# Patient Record
Sex: Female | Born: 1995 | Race: White | Hispanic: No | Marital: Single | Smoking: Former smoker
Health system: Southern US, Community
[De-identification: ages and names within clinical notes are randomized; demographics above are authoritative.]

## PROBLEM LIST (undated history)

## (undated) ENCOUNTER — Inpatient Hospital Stay (HOSPITAL_COMMUNITY): Payer: Self-pay

## (undated) DIAGNOSIS — F329 Major depressive disorder, single episode, unspecified: Secondary | ICD-10-CM

## (undated) DIAGNOSIS — F32A Depression, unspecified: Secondary | ICD-10-CM

## (undated) DIAGNOSIS — F419 Anxiety disorder, unspecified: Secondary | ICD-10-CM

## (undated) DIAGNOSIS — G039 Meningitis, unspecified: Secondary | ICD-10-CM

## (undated) DIAGNOSIS — M3500 Sicca syndrome, unspecified: Secondary | ICD-10-CM

## (undated) DIAGNOSIS — G919 Hydrocephalus, unspecified: Secondary | ICD-10-CM

## (undated) DIAGNOSIS — F4310 Post-traumatic stress disorder, unspecified: Secondary | ICD-10-CM

## (undated) DIAGNOSIS — G971 Other reaction to spinal and lumbar puncture: Secondary | ICD-10-CM

## (undated) HISTORY — PX: URETHRA SURGERY: SHX824

## (undated) HISTORY — PX: URETER SURGERY: SHX823

## (undated) HISTORY — PX: OTHER SURGICAL HISTORY: SHX169

## (undated) HISTORY — PX: CHOLECYSTECTOMY: SHX55

## (undated) HISTORY — PX: APPENDECTOMY: SHX54

## (undated) HISTORY — DX: Hydrocephalus, unspecified: G91.9

## (undated) HISTORY — DX: Sjogren syndrome, unspecified: M35.00

---

## 2015-02-27 ENCOUNTER — Encounter (HOSPITAL_COMMUNITY): Payer: Self-pay | Admitting: *Deleted

## 2015-02-27 ENCOUNTER — Inpatient Hospital Stay (HOSPITAL_COMMUNITY)
Admission: AD | Admit: 2015-02-27 | Discharge: 2015-02-27 | Disposition: A | Discharge status: Home / Self Care | Payer: Managed Care, Other (non HMO) | Source: Ambulatory Visit | Attending: Obstetrics & Gynecology | Admitting: Obstetrics & Gynecology

## 2015-02-27 ENCOUNTER — Inpatient Hospital Stay (HOSPITAL_COMMUNITY): Payer: Managed Care, Other (non HMO)

## 2015-02-27 DIAGNOSIS — O4691 Antepartum hemorrhage, unspecified, first trimester: Secondary | ICD-10-CM | POA: Diagnosis not present

## 2015-02-27 DIAGNOSIS — O209 Hemorrhage in early pregnancy, unspecified: Secondary | ICD-10-CM

## 2015-02-27 DIAGNOSIS — Z3A01 Less than 8 weeks gestation of pregnancy: Secondary | ICD-10-CM | POA: Diagnosis not present

## 2015-02-27 DIAGNOSIS — R102 Pelvic and perineal pain: Secondary | ICD-10-CM | POA: Diagnosis not present

## 2015-02-27 LAB — CBC
HEMATOCRIT: 37.2 % (ref 36.0–46.0)
HEMOGLOBIN: 12.9 g/dL (ref 12.0–15.0)
MCH: 30.3 pg (ref 26.0–34.0)
MCHC: 34.7 g/dL (ref 30.0–36.0)
MCV: 87.3 fL (ref 78.0–100.0)
Platelets: 253 10*3/uL (ref 150–400)
RBC: 4.26 MIL/uL (ref 3.87–5.11)
RDW: 12.6 % (ref 11.5–15.5)
WBC: 5.3 10*3/uL (ref 4.0–10.5)

## 2015-02-27 LAB — WET PREP, GENITAL
Clue Cells Wet Prep HPF POC: NONE SEEN
SPERM: NONE SEEN
TRICH WET PREP: NONE SEEN
YEAST WET PREP: NONE SEEN

## 2015-02-27 LAB — URINALYSIS, ROUTINE W REFLEX MICROSCOPIC
BILIRUBIN URINE: NEGATIVE
Glucose, UA: NEGATIVE mg/dL
HGB URINE DIPSTICK: NEGATIVE
KETONES UR: NEGATIVE mg/dL
Nitrite: NEGATIVE
PROTEIN: NEGATIVE mg/dL
Specific Gravity, Urine: 1.015 (ref 1.005–1.030)
pH: 7 (ref 5.0–8.0)

## 2015-02-27 LAB — URINE MICROSCOPIC-ADD ON: RBC / HPF: NONE SEEN RBC/hpf (ref 0–5)

## 2015-02-27 LAB — POCT PREGNANCY, URINE: PREG TEST UR: POSITIVE — AB

## 2015-02-27 LAB — HCG, QUANTITATIVE, PREGNANCY: HCG, BETA CHAIN, QUANT, S: 218 m[IU]/mL — AB (ref <5)

## 2015-02-27 LAB — TYPE AND SCREEN
ABO/RH(D): A POS
Antibody Screen: NEGATIVE

## 2015-02-27 LAB — ABO/RH: ABO/RH(D): A POS

## 2015-02-27 NOTE — MAU Note (Signed)
Spotting since last Sunday, sharp lower abd pain also for the last week.  Pos HPT on 11/26 & 11/28.

## 2015-02-27 NOTE — Discharge Instructions (Signed)

## 2015-02-27 NOTE — MAU Provider Note (Signed)
MAU HISTORY AND PHYSICAL  Chief Complaint:  Vaginal Bleeding and Abdominal Pain   Laura [name] is a 19 y.o.  G2P1001 with IUP at [redacted]w[redacted]d presenting for Vaginal Bleeding and Abdominal Pain  Spotting for one week. Developed pain a day after spotting. Pain getting worse. Pelvic pain, bilateral. No dysuria or hematuria. No fevers or chills. Positive upt at home. Feeling nauseaus. Taking pnv, no other meds. Bleeding described as light - pink tinge when wipes.   Past Medical History  Diagnosis Date  . Medical history non-contributory     Past Surgical History  Procedure Laterality Date  . Cholecystectomy    . Appendectomy      History reviewed. No pertinent family history.  Social History  Substance Use Topics  . Smoking status: Former Games developer  . Smokeless tobacco: None  . Alcohol Use: No    Allergies  Allergen Reactions  . Demerol [Meperidine] Hives    No prescriptions prior to admission    Review of Systems - Negative except for what is mentioned in HPI.  Physical Exam  Blood pressure 107/63, pulse 77, temperature 98.2 F (36.8 C), temperature source Oral, resp. rate 16, height  (1.702 m), weight 160 lb (72.576 kg), last menstrual period 01/20/2015. GENERAL: Well-developed, well-nourished female in no acute distress.  LUNGS: Clear to auscultation bilaterally.  HEART: Regular rate and rhythm. ABDOMEN: Soft, nontender, nondistended, gravid.  EXTREMITIES: Nontender, no edema, 2+ distal pulses. GU: white discharge, normal cervix, visually closed   Labs: Results for orders placed or performed during the hospital encounter of 02/27/15 (from the past 24 hour(s))  Urinalysis, Routine w reflex microscopic (not at [facility])   Collection Time: 02/27/15  1:00 PM  Result Value Ref Range   Color, Urine YELLOW YELLOW   APPearance CLEAR CLEAR   Specific Gravity, Urine 1.015 1.005 - 1.030   pH 7.0 5.0 - 8.0   Glucose, UA NEGATIVE NEGATIVE mg/dL   Hgb urine dipstick NEGATIVE  NEGATIVE   Bilirubin Urine NEGATIVE NEGATIVE   Ketones, ur NEGATIVE NEGATIVE mg/dL   Protein, ur NEGATIVE NEGATIVE mg/dL   Nitrite NEGATIVE NEGATIVE   Leukocytes, UA SMALL (A) NEGATIVE  Urine microscopic-add on   Collection Time: 02/27/15  1:00 PM  Result Value Ref Range   Squamous Epithelial / LPF 0-5 (A) NONE SEEN   WBC, UA 0-5 0 - 5 WBC/hpf   RBC / HPF NONE SEEN 0 - 5 RBC/hpf   Bacteria, UA FEW (A) NONE SEEN  Pregnancy, urine POC   Collection Time: 02/27/15  1:24 PM  Result Value Ref Range   Preg Test, Ur POSITIVE (A) NEGATIVE  CBC   Collection Time: 02/27/15  2:25 PM  Result Value Ref Range   WBC 5.3 4.0 - 10.5 K/uL   RBC 4.26 3.87 - 5.11 MIL/uL   Hemoglobin 12.9 12.0 - 15.0 g/dL   HCT 60.4 54.0 - 98.1 %   MCV 87.3 78.0 - 100.0 fL   MCH 30.3 26.0 - 34.0 pg   MCHC 34.7 30.0 - 36.0 g/dL   RDW 19.1 47.8 - 29.5 %   Platelets 253 150 - 400 K/uL  hCG, quantitative, pregnancy   Collection Time: 02/27/15  2:25 PM  Result Value Ref Range   hCG, Beta Chain, Quant, S 218 (H) <5 mIU/mL  Type and screen   Collection Time: 02/27/15  2:25 PM  Result Value Ref Range   ABO/RH(D) A POS    Antibody Screen NEG    Sample Expiration 03/02/2015  Wet prep, genital   Collection Time: 02/27/15  3:10 PM  Result Value Ref Range   Yeast Wet Prep HPF POC NONE SEEN NONE SEEN   Trich, Wet Prep NONE SEEN NONE SEEN   Clue Cells Wet Prep HPF POC NONE SEEN NONE SEEN   WBC, Wet Prep HPF POC MANY (A) NONE SEEN   Sperm NONE SEEN     Imaging Studies:  [place] Ob Comp Less 14 Wks  02/27/2015  CLINICAL DATA:  Vaginal bleeding and pain. EXAM: OBSTETRIC <14 WK US AND TRANSVAGINAL OB US TECHNIQUE: Both transabdominal and transvaginal ultrasound examinations were performed for complete evaluation of the gestation as well as the maternal uterus, adnexal regions, and pelvic cul-de-sac. Transvaginal technique was performed to assess early pregnancy. COMPARISON:  None. FINDINGS: Intrauterine gestational sac: A  well-defined double decidual sac is not seen. There is a 3 mm questionable gestational sac within the uterus. Yolk sac:  Not visualized Embryo:  Not visualized Cardiac Activity: Not visualized MSD: 3 mm  mm   5 w   0  d Maternal uterus/adnexae: There is no demonstrable subchorionic hemorrhage. Cervical os is closed. There are no extrauterine pelvic or adnexal masses. There is a small amount of free pelvic fluid, however. IMPRESSION: Questionable gestational sac within the uterus. If this structure in the uterus indeed is a gestational sac, estimated gestational age is approximately 5 weeks. An endometrial pseudosac cannot be excluded. There is a small amount of free fluid in the maternal pelvis but no extrauterine pelvic or adnexal masses appreciable. It should be noted that ectopic gestation with a so-called pseudosac within the endometrium cannot be excluded at this time. This circumstance warrants close clinical and serial beta HCG laboratory evaluations. Repeat timing of this ultrasound will in part depend on serial beta HCG evaluations. These results will be called to the ordering clinician or representative by the Radiologist Assistant, and communication documented in the PACS or zVision Dashboard. Electronically Signed   By: Bretta BangWilliam  Woodruff III M.D.   On: 02/27/2015 15:58   [place] Ob Transvaginal  02/27/2015  CLINICAL DATA:  Vaginal bleeding and pain. EXAM: OBSTETRIC <14 WK US AND TRANSVAGINAL OB US TECHNIQUE: Both transabdominal and transvaginal ultrasound examinations were performed for complete evaluation of the gestation as well as the maternal uterus, adnexal regions, and pelvic cul-de-sac. Transvaginal technique was performed to assess early pregnancy. COMPARISON:  None. FINDINGS: Intrauterine gestational sac: A well-defined double decidual sac is not seen. There is a 3 mm questionable gestational sac within the uterus. Yolk sac:  Not visualized Embryo:  Not visualized Cardiac Activity: Not visualized  MSD: 3 mm  mm   5 w   0  d Maternal uterus/adnexae: There is no demonstrable subchorionic hemorrhage. Cervical os is closed. There are no extrauterine pelvic or adnexal masses. There is a small amount of free pelvic fluid, however. IMPRESSION: Questionable gestational sac within the uterus. If this structure in the uterus indeed is a gestational sac, estimated gestational age is approximately 5 weeks. An endometrial pseudosac cannot be excluded. There is a small amount of free fluid in the maternal pelvis but no extrauterine pelvic or adnexal masses appreciable. It should be noted that ectopic gestation with a so-called pseudosac within the endometrium cannot be excluded at this time. This circumstance warrants close clinical and serial beta HCG laboratory evaluations. Repeat timing of this ultrasound will in part depend on serial beta HCG evaluations. These results will be called to the ordering clinician or representative by the  Printmaker, and communication documented in the PACS or zVision Dashboard. Electronically Signed   By: Bretta Bang III M.D.   On: 02/27/2015 15:58    Assessment: Laura [name] is  19 y.o. G2P1001 at [redacted]w[redacted]d presents with first trimester vaginal bleeding and pelvic pain. HCG is 218, u/s shows possible early pregnancy, possible pseudosac (thus possible ectopic). UA not suggestive of infection; neither is wet prep. Rh positive. Not anemic on CBC.   Plan: - f/u 48 hours for repeat HCG (signed and held) - ectopic return precautions  Cherrie Gauze [facility] 12/11/20164:13 PM

## 2015-02-28 LAB — GC/CHLAMYDIA PROBE AMP (~~LOC~~) NOT AT ARMC
CHLAMYDIA, DNA PROBE: NEGATIVE
NEISSERIA GONORRHEA: NEGATIVE

## 2015-03-01 ENCOUNTER — Inpatient Hospital Stay (HOSPITAL_COMMUNITY)
Admission: AD | Admit: 2015-03-01 | Discharge: 2015-03-01 | Disposition: A | Discharge status: Home / Self Care | Payer: Managed Care, Other (non HMO) | Source: Ambulatory Visit | Attending: Family Medicine | Admitting: Family Medicine

## 2015-03-01 DIAGNOSIS — Z3A01 Less than 8 weeks gestation of pregnancy: Secondary | ICD-10-CM | POA: Insufficient documentation

## 2015-03-01 DIAGNOSIS — O039 Complete or unspecified spontaneous abortion without complication: Secondary | ICD-10-CM | POA: Insufficient documentation

## 2015-03-01 LAB — HCG, QUANTITATIVE, PREGNANCY: hCG, Beta Chain, Quant, S: 170 m[IU]/mL — ABNORMAL HIGH (ref <5)

## 2015-03-01 NOTE — Discharge Instructions (Signed)
Miscarriage  A miscarriage is the sudden loss of an unborn baby (fetus) before the 20th week of pregnancy. Most miscarriages happen in the first 3 months of pregnancy. Sometimes, it happens before a woman even knows she is pregnant. A miscarriage is also called a "spontaneous miscarriage" or "early pregnancy loss." Having a miscarriage can be an emotional experience. Talk with your caregiver about any questions you may have about miscarrying, the grieving process, and your future pregnancy plans.  CAUSES    Problems with the fetal chromosomes that make it impossible for the baby to develop normally. Problems with the baby's genes or chromosomes are most often the result of errors that occur, by chance, as the embryo divides and grows. The problems are not inherited from the parents.   Infection of the cervix or uterus.    Hormone problems.    Problems with the cervix, such as having an incompetent cervix. This is when the tissue in the cervix is not strong enough to hold the pregnancy.    Problems with the uterus, such as an abnormally shaped uterus, uterine fibroids, or congenital abnormalities.    Certain medical conditions.    Smoking, drinking alcohol, or taking illegal drugs.    Trauma.   Often, the cause of a miscarriage is unknown.   SYMPTOMS    Vaginal bleeding or spotting, with or without cramps or pain.   Pain or cramping in the abdomen or lower back.   Passing fluid, tissue, or blood clots from the vagina.  DIAGNOSIS   Your caregiver will perform a physical exam. You may also have an ultrasound to confirm the miscarriage. Blood or urine tests may also be ordered.  TREATMENT    Sometimes, treatment is not necessary if you naturally pass all the fetal tissue that was in the uterus. If some of the fetus or placenta remains in the body (incomplete miscarriage), tissue left behind may become infected and must be removed. Usually, a dilation and curettage (D and C) procedure is performed.  During a D and C procedure, the cervix is widened (dilated) and any remaining fetal or placental tissue is gently removed from the uterus.   Antibiotic medicines are prescribed if there is an infection. Other medicines may be given to reduce the size of the uterus (contract) if there is a lot of bleeding.   If you have Rh negative blood and your baby was Rh positive, you will need a Rh immunoglobulin shot. This shot will protect any future baby from having Rh blood problems in future pregnancies.  HOME CARE INSTRUCTIONS    Your caregiver may order bed rest or may allow you to continue light activity. Resume activity as directed by your caregiver.   Have someone help with home and family responsibilities during this time.    Keep track of the number of sanitary pads you use each day and how soaked (saturated) they are. Write down this information.    Do not use tampons. Do not douche or have sexual intercourse until approved by your caregiver.    Only take over-the-counter or prescription medicines for pain or discomfort as directed by your caregiver.    Do not take aspirin. Aspirin can cause bleeding.    Keep all follow-up appointments with your caregiver.    If you or your partner have problems with grieving, talk to your caregiver or seek counseling to help cope with the pregnancy loss. Allow enough time to grieve before trying to get pregnant again.     SEEK IMMEDIATE MEDICAL CARE IF:    You have severe cramps or pain in your back or abdomen.   You have a fever.   You pass large blood clots (walnut-sized or larger) ortissue from your vagina. Save any tissue for your caregiver to inspect.    Your bleeding increases.    You have a thick, bad-smelling vaginal discharge.   You become lightheaded, weak, or you faint.    You have chills.   MAKE SURE YOU:   Understand these instructions.   Will watch your condition.   Will get help right away if you are not doing well or get worse.     This  information is not intended to replace advice given to you by your health care provider. Make sure you discuss any questions you have with your health care provider.     Document Released: 08/29/2000 Document Revised: 06/30/2012 Document Reviewed: 04/24/2011  Elsevier Interactive Patient Education 2016 [employer]

## 2015-03-01 NOTE — MAU Note (Signed)
Pt presents to MAU for repeat labs BHCG. Reports pink when wiping, lower abdominal pain at times.

## 2015-03-01 NOTE — MAU Provider Note (Signed)
History   First Provider Initiated Contact with Patient 03/01/15 1721     Chief Complaint:  Labs Only   Laura [name] is  19 y.o. G2P1001 Patient's last menstrual period was 01/20/2015 (exact date).. Patient is here for follow up of quantitative HCG and ongoing surveillance of pregnancy status.   She is 103w5d weeks gestation  by LMP.    Since her last visit, the patient is without new complaint.     ROS Abdomin Pain: mild Vaginal bleeding: spotting.   Passage of clots or tissue: None Dizziness: None  Her previous Quantitative HCG values are:  Results for ECHO, PROPP (MRN [id number]) as of 03/01/2015 17:16  Ref. Range 02/27/2015 14:25  HCG, Beta Chain, Quant, S Latest Ref Range: <5 mIU/mL 218 (H)    Physical Exam   BP 117/76 mmHg  Pulse 70  Temp(Src) 98.4 F (36.9 C)  Resp 18  LMP 01/20/2015 (Exact Date) Constitutional: Well-nourished female in no apparent distress. No pallor Neuro: Alert and oriented 4 Cardiovascular: Normal rate Respiratory: Normal effort and rate Abdomen: Soft, nontender Gynecological Exam: examination not indicated  Labs: Results for orders placed or performed during the hospital encounter of 03/01/15 (from the past 24 hour(s))  hCG, quantitative, pregnancy   Collection Time: 03/01/15  4:15 PM  Result Value Ref Range   hCG, Beta Chain, Quant, S 170 (H) <5 mIU/mL    Ultrasound Studies:   US Ob Comp Less 14 Wks  02/27/2015  CLINICAL DATA:  Vaginal bleeding and pain. EXAM: OBSTETRIC <14 WK [place] AND TRANSVAGINAL OB US TECHNIQUE: Both transabdominal and transvaginal ultrasound examinations were performed for complete evaluation of the gestation as well as the maternal uterus, adnexal regions, and pelvic cul-de-sac. Transvaginal technique was performed to assess early pregnancy. COMPARISON:  None. FINDINGS: Intrauterine gestational sac: A well-defined double decidual sac is not seen. There is a 3 mm questionable gestational sac within the  uterus. Yolk sac:  Not visualized Embryo:  Not visualized Cardiac Activity: Not visualized MSD: 3 mm  mm   5 w   0  d Maternal uterus/adnexae: There is no demonstrable subchorionic hemorrhage. Cervical os is closed. There are no extrauterine pelvic or adnexal masses. There is a small amount of free pelvic fluid, however. IMPRESSION: Questionable gestational sac within the uterus. If this structure in the uterus indeed is a gestational sac, estimated gestational age is approximately 5 weeks. An endometrial pseudosac cannot be excluded. There is a small amount of free fluid in the maternal pelvis but no extrauterine pelvic or adnexal masses appreciable. It should be noted that ectopic gestation with a so-called pseudosac within the endometrium cannot be excluded at this time. This circumstance warrants close clinical and serial beta HCG laboratory evaluations. Repeat timing of this ultrasound will in part depend on serial beta HCG evaluations. These results will be called to the ordering clinician or representative by the Radiologist Assistant, and communication documented in the PACS or zVision Dashboard. Electronically Signed   By: Bretta Bang III M.D.   On: 02/27/2015 15:58   US Ob Transvaginal  02/27/2015  CLINICAL DATA:  Vaginal bleeding and pain. EXAM: OBSTETRIC <14 WK [place] AND TRANSVAGINAL OB US TECHNIQUE: Both transabdominal and transvaginal ultrasound examinations were performed for complete evaluation of the gestation as well as the maternal uterus, adnexal regions, and pelvic cul-de-sac. Transvaginal technique was performed to assess early pregnancy. COMPARISON:  None. FINDINGS: Intrauterine gestational sac: A well-defined double decidual sac is not seen. There is a 3 mm questionable  gestational sac within the uterus. Yolk sac:  Not visualized Embryo:  Not visualized Cardiac Activity: Not visualized MSD: 3 mm  mm   5 w   0  d Maternal uterus/adnexae: There is no demonstrable subchorionic hemorrhage.  Cervical os is closed. There are no extrauterine pelvic or adnexal masses. There is a small amount of free pelvic fluid, however. IMPRESSION: Questionable gestational sac within the uterus. If this structure in the uterus indeed is a gestational sac, estimated gestational age is approximately 5 weeks. An endometrial pseudosac cannot be excluded. There is a small amount of free fluid in the maternal pelvis but no extrauterine pelvic or adnexal masses appreciable. It should be noted that ectopic gestation with a so-called pseudosac within the endometrium cannot be excluded at this time. This circumstance warrants close clinical and serial beta HCG laboratory evaluations. Repeat timing of this ultrasound will in part depend on serial beta HCG evaluations. These results will be called to the ordering clinician or representative by the Radiologist Assistant, and communication documented in the PACS or zVision Dashboard. Electronically Signed   By: Bretta BangWilliam  Woodruff III M.D.   On: 02/27/2015 15:58    MAU course/MDM: Quantitative hCG ordered  Pain and bleeding in early pregnancy with drop in Quant, but hemodynamically stable. Consistent with either miscarriage are spontaneously resolving ectopic.  Assessment: 519w5d weeks gestation w/ drop in Quant 1. Miscarriage    Plan: Discharge home in stable condition. Bleeding precautions Support given. Offered chaplain--declined Follow-up Information    [facility]   Specialty:  Obstetrics and Gynecology   Why:  Weekly until pregnancy hormone level less than 1.   Contact information:   [address] [place] [id number] [phone]      Follow up with [facility].   Why:  As needed in emergencies   Contact information:   [address] [id number] mc [place] [id number] [phone]        Medication List    Notice    You have not been  prescribed any medications.     Dorathy KinsmanVirginia Takoda Siedlecki, CNM 03/01/2015, 5:16 PM  2/3

## 2015-03-09 ENCOUNTER — Other Ambulatory Visit: Payer: Managed Care, Other (non HMO)

## 2015-03-11 ENCOUNTER — Other Ambulatory Visit: Payer: Managed Care, Other (non HMO)

## 2015-03-11 DIAGNOSIS — O039 Complete or unspecified spontaneous abortion without complication: Secondary | ICD-10-CM

## 2015-03-12 LAB — HCG, QUANTITATIVE, PREGNANCY: hCG, Beta Chain, Quant, S: <2 m[IU]/mL

## 2015-03-16 ENCOUNTER — Telehealth: Payer: Self-pay | Admitting: General Practice

## 2015-03-16 NOTE — Telephone Encounter (Signed)
Left another message for patient to call back 

## 2015-03-16 NOTE — Telephone Encounter (Signed)
Patient called and left message stating she is returning our call. 

## 2015-03-17 ENCOUNTER — Encounter: Payer: Self-pay | Admitting: Family Medicine

## 2015-03-17 NOTE — Telephone Encounter (Signed)
Called pt and informed pt of her <2 beta results.  I informed her no more f/u lab work and that she has an appt scheduled on 03/30/14 to discuss any concerns and BC.  Pt stated understanding with no further questions.

## 2015-03-31 ENCOUNTER — Encounter: Payer: Managed Care, Other (non HMO) | Admitting: Obstetrics and Gynecology

## 2015-03-31 ENCOUNTER — Encounter: Payer: Self-pay | Admitting: *Deleted

## 2015-03-31 ENCOUNTER — Telehealth: Payer: Self-pay | Admitting: *Deleted

## 2015-03-31 NOTE — Telephone Encounter (Signed)
Laura [name] missed a scheduled appointment today for follow up after a miscarriage and birth control if desired.   Called Laura [name] and left a message that she missed a scheduled appointment- please call us to reschedule if you are having any problems or any needs that we can assist with.  Will also send letter.

## 2015-05-27 ENCOUNTER — Encounter: Payer: Self-pay | Admitting: Family

## 2015-05-27 ENCOUNTER — Ambulatory Visit (INDEPENDENT_AMBULATORY_CARE_PROVIDER_SITE_OTHER): Payer: Managed Care, Other (non HMO) | Admitting: Family

## 2015-05-27 VITALS — BP 110/74 | HR 81 | Wt 159.0 lb

## 2015-05-27 DIAGNOSIS — Z124 Encounter for screening for malignant neoplasm of cervix: Secondary | ICD-10-CM

## 2015-05-27 DIAGNOSIS — Z113 Encounter for screening for infections with a predominantly sexual mode of transmission: Secondary | ICD-10-CM | POA: Diagnosis not present

## 2015-05-27 DIAGNOSIS — Z3491 Encounter for supervision of normal pregnancy, unspecified, first trimester: Secondary | ICD-10-CM

## 2015-05-27 DIAGNOSIS — Z3481 Encounter for supervision of other normal pregnancy, first trimester: Secondary | ICD-10-CM

## 2015-05-27 DIAGNOSIS — Z3490 Encounter for supervision of normal pregnancy, unspecified, unspecified trimester: Secondary | ICD-10-CM | POA: Insufficient documentation

## 2015-05-27 LAB — CBC
HEMATOCRIT: 37.7 % (ref 36.0–46.0)
HEMOGLOBIN: 12.8 g/dL (ref 12.0–15.0)
MCH: 29.9 pg (ref 26.0–34.0)
MCHC: 34 g/dL (ref 30.0–36.0)
MCV: 88.1 fL (ref 78.0–100.0)
MPV: 10.4 fL (ref 8.6–12.4)
Platelets: 181 10*3/uL (ref 150–400)
RBC: 4.28 MIL/uL (ref 3.87–5.11)
RDW: 13.3 % (ref 11.5–15.5)
WBC: 4.6 10*3/uL (ref 4.0–10.5)

## 2015-05-27 LAB — HIV ANTIBODY (ROUTINE TESTING W REFLEX): HIV 1&2 Ab, 4th Generation: NONREACTIVE

## 2015-05-27 NOTE — Progress Notes (Signed)
Ultrasound CRL:17.4 mm and consistent with LMP. Armandina StammerJennifer Howard RN BSN
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
## 2015-05-27 NOTE — Patient Instructions (Signed)
First Trimester of Pregnancy The first trimester of pregnancy is from week 1 until the end of week 12 (months 1 through 3). A week after a sperm fertilizes an egg, the egg will implant on the wall of the uterus. This embryo will begin to develop into a baby. Genes from you and your partner are forming the baby. The female genes determine whether the baby is a boy or a girl. At 6-8 weeks, the eyes and face are formed, and the heartbeat can be seen on ultrasound. At the end of 12 weeks, all the baby's organs are formed.  Now that you are pregnant, you will want to do everything you can to have a healthy baby. Two of the most important things are to get good prenatal care and to follow your health care provider's instructions. Prenatal care is all the medical care you receive before the baby's birth. This care will help prevent, find, and treat any problems during the pregnancy and childbirth. BODY CHANGES Your body goes through many changes during pregnancy. The changes vary from woman to woman.   You may gain or lose a couple of pounds at first.  You may feel sick to your stomach (nauseous) and throw up (vomit). If the vomiting is uncontrollable, call your health care provider.  You may tire easily.  You may develop headaches that can be relieved by medicines approved by your health care provider.  You may urinate more often. Painful urination may mean you have a bladder infection.  You may develop heartburn as a result of your pregnancy.  You may develop constipation because certain hormones are causing the muscles that push waste through your intestines to slow down.  You may develop hemorrhoids or swollen, bulging veins (varicose veins).  Your breasts may begin to grow larger and become tender. Your nipples may stick out more, and the tissue that surrounds them (areola) may become darker.  Your gums may bleed and may be sensitive to brushing and flossing.  Dark spots or blotches (chloasma,  mask of pregnancy) may develop on your face. This will likely fade after the baby is born.  Your menstrual periods will stop.  You may have a loss of appetite.  You may develop cravings for certain kinds of food.  You may have changes in your emotions from day to day, such as being excited to be pregnant or being concerned that something may go wrong with the pregnancy and baby.  You may have more vivid and strange dreams.  You may have changes in your hair. These can include thickening of your hair, rapid growth, and changes in texture. Some women also have hair loss during or after pregnancy, or hair that feels dry or thin. Your hair will most likely return to normal after your baby is born. WHAT TO EXPECT AT YOUR PRENATAL VISITS During a routine prenatal visit:  You will be weighed to make sure you and the baby are growing normally.  Your blood pressure will be taken.  Your abdomen will be measured to track your baby's growth.  The fetal heartbeat will be listened to starting around week 10 or 12 of your pregnancy.  Test results from any previous visits will be discussed. Your health care provider may ask you:  How you are feeling.  If you are feeling the baby move.  If you have had any abnormal symptoms, such as leaking fluid, bleeding, severe headaches, or abdominal cramping.  If you are using any tobacco products,   including cigarettes, chewing tobacco, and electronic cigarettes.  If you have any questions. Other tests that may be performed during your first trimester include:  Blood tests to find your blood type and to check for the presence of any previous infections. They will also be used to check for low iron levels (anemia) and Rh antibodies. Later in the pregnancy, blood tests for diabetes will be done along with other tests if problems develop.  Urine tests to check for infections, diabetes, or protein in the urine.  An ultrasound to confirm the proper growth  and development of the baby.  An amniocentesis to check for possible genetic problems.  Fetal screens for spina bifida and Down syndrome.  You may need other tests to make sure you and the baby are doing well.  HIV (human immunodeficiency virus) testing. Routine prenatal testing includes screening for HIV, unless you choose not to have this test. HOME CARE INSTRUCTIONS  Medicines  Follow your health care provider's instructions regarding medicine use. Specific medicines may be either safe or unsafe to take during pregnancy.  Take your prenatal vitamins as directed.  If you develop constipation, try taking a stool softener if your health care provider approves. Diet  Eat regular, well-balanced meals. Choose a variety of foods, such as meat or vegetable-based protein, fish, milk and low-fat dairy products, vegetables, fruits, and whole grain breads and cereals. Your health care provider will help you determine the amount of weight gain that is right for you.  Avoid raw meat and uncooked cheese. These carry germs that can cause birth defects in the baby.  Eating four or five small meals rather than three large meals a day may help relieve nausea and vomiting. If you start to feel nauseous, eating a few soda crackers can be helpful. Drinking liquids between meals instead of during meals also seems to help nausea and vomiting.  If you develop constipation, eat more high-fiber foods, such as fresh vegetables or fruit and whole grains. Drink enough fluids to keep your urine clear or pale yellow. Activity and Exercise  Exercise only as directed by your health care provider. Exercising will help you:  Control your weight.  Stay in shape.  Be prepared for labor and delivery.  Experiencing pain or cramping in the lower abdomen or low back is a good sign that you should stop exercising. Check with your health care provider before continuing normal exercises.  Try to avoid standing for long  periods of time. Move your legs often if you must stand in one place for a long time.  Avoid heavy lifting.  Wear low-heeled shoes, and practice good posture.  You may continue to have sex unless your health care provider directs you otherwise. Relief of Pain or Discomfort  Wear a good support bra for breast tenderness.   Take warm sitz baths to soothe any pain or discomfort caused by hemorrhoids. Use hemorrhoid cream if your health care provider approves.   Rest with your legs elevated if you have leg cramps or low back pain.  If you develop varicose veins in your legs, wear support hose. Elevate your feet for 15 minutes, 3-4 times a day. Limit salt in your diet. Prenatal Care  Schedule your prenatal visits by the twelfth week of pregnancy. They are usually scheduled monthly at first, then more often in the last 2 months before delivery.  Write down your questions. Take them to your prenatal visits.  Keep all your prenatal visits as directed by your   health care provider. Safety  Wear your seat belt at all times when driving.  Make a list of emergency phone numbers, including numbers for family, friends, the hospital, and police and fire departments. General Tips  Ask your health care provider for a referral to a local prenatal education class. Begin classes no later than at the beginning of month 6 of your pregnancy.  Ask for help if you have counseling or nutritional needs during pregnancy. Your health care provider can offer advice or refer you to specialists for help with various needs.  Do not use hot tubs, steam rooms, or saunas.  Do not douche or use tampons or scented sanitary pads.  Do not cross your legs for long periods of time.  Avoid cat litter boxes and soil used by cats. These carry germs that can cause birth defects in the baby and possibly loss of the fetus by miscarriage or stillbirth.  Avoid all smoking, herbs, alcohol, and medicines not prescribed by  your health care provider. Chemicals in these affect the formation and growth of the baby.  Do not use any tobacco products, including cigarettes, chewing tobacco, and electronic cigarettes. If you need help quitting, ask your health care provider. You may receive counseling support and other resources to help you quit.  Schedule a dentist appointment. At home, brush your teeth with a soft toothbrush and be gentle when you floss. SEEK MEDICAL CARE IF:   You have dizziness.  You have mild pelvic cramps, pelvic pressure, or nagging pain in the abdominal area.  You have persistent nausea, vomiting, or diarrhea.  You have a bad smelling vaginal discharge.  You have pain with urination.  You notice increased swelling in your face, hands, legs, or ankles. SEEK IMMEDIATE MEDICAL CARE IF:   You have a fever.  You are leaking fluid from your vagina.  You have spotting or bleeding from your vagina.  You have severe abdominal cramping or pain.  You have rapid weight gain or loss.  You vomit blood or material that looks like coffee grounds.  You are exposed to German measles and have never had them.  You are exposed to fifth disease or chickenpox.  You develop a severe headache.  You have shortness of breath.  You have any kind of trauma, such as from a fall or a car accident.   This information is not intended to replace advice given to you by your health care provider. Make sure you discuss any questions you have with your health care provider.   Document Released: 02/27/2001 Document Revised: 03/26/2014 Document Reviewed: 01/13/2013 Elsevier Interactive Patient Education 2016 Elsevier Inc.  

## 2015-05-29 LAB — CULTURE, URINE COMPREHENSIVE: Colony Count: 30000

## 2015-05-30 LAB — OBSTETRIC PANEL
Antibody Screen: NEGATIVE
BASOS ABS: 0 10*3/uL (ref 0.0–0.1)
Basophils Relative: 1 % (ref 0–1)
Eosinophils Absolute: 0 10*3/uL (ref 0.0–0.7)
Eosinophils Relative: 1 % (ref 0–5)
HCT: 37.7 % (ref 36.0–46.0)
Hemoglobin: 12.8 g/dL (ref 12.0–15.0)
Hepatitis B Surface Ag: NEGATIVE
LYMPHS ABS: 2.2 10*3/uL (ref 0.7–4.0)
LYMPHS PCT: 48 % — AB (ref 12–46)
MCH: 29.9 pg (ref 26.0–34.0)
MCHC: 34 g/dL (ref 30.0–36.0)
MCV: 88.1 fL (ref 78.0–100.0)
MPV: 10.4 fL (ref 8.6–12.4)
Monocytes Absolute: 0.4 10*3/uL (ref 0.1–1.0)
Monocytes Relative: 9 % (ref 3–12)
NEUTROS ABS: 1.9 10*3/uL (ref 1.7–7.7)
NEUTROS PCT: 41 % — AB (ref 43–77)
PLATELETS: 181 10*3/uL (ref 150–400)
RBC: 4.28 MIL/uL (ref 3.87–5.11)
RDW: 13.3 % (ref 11.5–15.5)
RUBELLA: 4.62 {index} — AB (ref <0.90)
Rh Type: POSITIVE
WBC: 4.6 10*3/uL (ref 4.0–10.5)

## 2015-05-30 LAB — GC/CHLAMYDIA PROBE AMP (~~LOC~~) NOT AT ARMC
CHLAMYDIA, DNA PROBE: NEGATIVE
NEISSERIA GONORRHEA: NEGATIVE

## 2015-05-30 LAB — CYTOLOGY - PAP

## 2015-06-15 ENCOUNTER — Encounter (HOSPITAL_COMMUNITY): Payer: Self-pay | Admitting: Family

## 2015-06-24 ENCOUNTER — Ambulatory Visit (INDEPENDENT_AMBULATORY_CARE_PROVIDER_SITE_OTHER): Payer: Managed Care, Other (non HMO) | Admitting: Advanced Practice Midwife

## 2015-06-24 ENCOUNTER — Encounter: Payer: Self-pay | Admitting: Advanced Practice Midwife

## 2015-06-24 VITALS — BP 113/74 | HR 68 | Wt 162.0 lb

## 2015-06-24 DIAGNOSIS — O210 Mild hyperemesis gravidarum: Secondary | ICD-10-CM

## 2015-06-24 DIAGNOSIS — Z3481 Encounter for supervision of other normal pregnancy, first trimester: Secondary | ICD-10-CM

## 2015-06-24 MED ORDER — DOXYLAMINE-PYRIDOXINE 10-10 MG PO TBEC
1.0000 | DELAYED_RELEASE_TABLET | Freq: Three times a day (TID) | ORAL | Status: DC | PRN
Start: 2015-06-24 — End: 2015-08-07

## 2015-06-24 NOTE — Patient Instructions (Addendum)
Morning Sickness Morning sickness is when you feel sick to your stomach (nauseous) during pregnancy. This nauseous feeling may or may not come with vomiting. It often occurs in the morning but can be a problem any time of day. Morning sickness is most common during the first trimester, but it may continue throughout pregnancy. While morning sickness is unpleasant, it is usually harmless unless you develop severe and continual vomiting (hyperemesis gravidarum). This condition requires more intense treatment.  CAUSES  The cause of morning sickness is not completely known but seems to be related to normal hormonal changes that occur in pregnancy. RISK FACTORS You are at greater risk if you:  Experienced nausea or vomiting before your pregnancy.  Had morning sickness during a previous pregnancy.  Are pregnant with more than one baby, such as twins. TREATMENT  Do not use any medicines (prescription, over-the-counter, or herbal) for morning sickness without first talking to your health care provider. Your health care provider may prescribe or recommend:  Vitamin B6 supplements.  Anti-nausea medicines.  The herbal medicine ginger. HOME CARE INSTRUCTIONS   Only take over-the-counter or prescription medicines as directed by your health care provider.  Taking multivitamins before getting pregnant can prevent or decrease the severity of morning sickness in most women.  Eat a piece of dry toast or unsalted crackers before getting out of bed in the morning.  Eat five or six small meals a day.  Eat dry and bland foods (rice, baked potato). Foods high in carbohydrates are often helpful.  Do not drink liquids with your meals. Drink liquids between meals.  Avoid greasy, fatty, and spicy foods.  Get someone to cook for you if the smell of any food causes nausea and vomiting.  If you feel nauseous after taking prenatal vitamins, take the vitamins at night or with a snack.  Snack on protein  foods (nuts, yogurt, cheese) between meals if you are hungry.  Eat unsweetened gelatins for desserts.  Wearing an acupressure wristband (worn for sea sickness) may be helpful.  Acupuncture may be helpful.  Do not smoke.  Get a humidifier to keep the air in your house free of odors.  Get plenty of fresh air. SEEK MEDICAL CARE IF:   Your home remedies are not working, and you need medicine.  You feel dizzy or lightheaded.  You are losing weight. SEEK IMMEDIATE MEDICAL CARE IF:   You have persistent and uncontrolled nausea and vomiting.  You pass out (faint). MAKE SURE YOU:  Understand these instructions.  Will watch your condition.  Will get help right away if you are not doing well or get worse.   This information is not intended to replace advice given to you by your health care provider. Make sure you discuss any questions you have with your health care provider.   Document Released: 04/26/2006 Document Revised: 03/10/2013 Document Reviewed: 08/20/2012 Elsevier Interactive Patient Education 2016 [employer].    Second Trimester of Pregnancy The second trimester is from week 13 through week 28, months 4 through 6. The second trimester is often a time when you feel your best. Your body has also adjusted to being pregnant, and you begin to feel better physically. Usually, morning sickness has lessened or quit completely, you may have more energy, and you may have an increase in appetite. The second trimester is also a time when the fetus is growing rapidly. At the end of the sixth month, the fetus is about 9 inches long and weighs about 1  pounds. You will likely begin to feel the baby move (quickening) between 18 and 20 weeks of the pregnancy. BODY CHANGES Your body goes through many changes during pregnancy. The changes vary from woman to woman.   Your weight will continue to increase. You will notice your lower abdomen bulging out.  You may begin to get stretch marks  on your hips, abdomen, and breasts.  You may develop headaches that can be relieved by medicines approved by your health care provider.  You may urinate more often because the fetus is pressing on your bladder.  You may develop or continue to have heartburn as a result of your pregnancy.  You may develop constipation because certain hormones are causing the muscles that push waste through your intestines to slow down.  You may develop hemorrhoids or swollen, bulging veins (varicose veins).  You may have back pain because of the weight gain and pregnancy hormones relaxing your joints between the bones in your pelvis and as a result of a shift in weight and the muscles that support your balance.  Your breasts will continue to grow and be tender.  Your gums may bleed and may be sensitive to brushing and flossing.  Dark spots or blotches (chloasma, mask of pregnancy) may develop on your face. This will likely fade after the baby is born.  A dark line from your belly button to the pubic area (linea nigra) may appear. This will likely fade after the baby is born.  You may have changes in your hair. These can include thickening of your hair, rapid growth, and changes in texture. Some women also have hair loss during or after pregnancy, or hair that feels dry or thin. Your hair will most likely return to normal after your baby is born. WHAT TO EXPECT AT YOUR PRENATAL VISITS During a routine prenatal visit:  You will be weighed to make sure you and the fetus are growing normally.  Your blood pressure will be taken.  Your abdomen will be measured to track your baby's growth.  The fetal heartbeat will be listened to.  Any test results from the previous visit will be discussed. Your health care provider may ask you:  How you are feeling.  If you are feeling the baby move.  If you have had any abnormal symptoms, such as leaking fluid, bleeding, severe headaches, or abdominal  cramping.  If you are using any tobacco products, including cigarettes, chewing tobacco, and electronic cigarettes.  If you have any questions. Other tests that may be performed during your second trimester include:  Blood tests that check for:  Low iron levels (anemia).  Gestational diabetes (between 24 and 28 weeks).  Rh antibodies.  Urine tests to check for infections, diabetes, or protein in the urine.  An ultrasound to confirm the proper growth and development of the baby.  An amniocentesis to check for possible genetic problems.  Fetal screens for spina bifida and Down syndrome.  HIV (human immunodeficiency virus) testing. Routine prenatal testing includes screening for HIV, unless you choose not to have this test. HOME CARE INSTRUCTIONS   Avoid all smoking, herbs, alcohol, and unprescribed drugs. These chemicals affect the formation and growth of the baby.  Do not use any tobacco products, including cigarettes, chewing tobacco, and electronic cigarettes. If you need help quitting, ask your health care provider. You may receive counseling support and other resources to help you quit.  Follow your health care provider's instructions regarding medicine use. There are medicines  that are either safe or unsafe to take during pregnancy.  Exercise only as directed by your health care provider. Experiencing uterine cramps is a good sign to stop exercising.  Continue to eat regular, healthy meals.  Wear a good support bra for breast tenderness.  Do not use hot tubs, steam rooms, or saunas.  Wear your seat belt at all times when driving.  Avoid raw meat, uncooked cheese, cat litter boxes, and soil used by cats. These carry germs that can cause birth defects in the baby.  Take your prenatal vitamins.  Take 1500-2000 mg of calcium daily starting at the 20th week of pregnancy until you deliver your baby.  Try taking a stool softener (if your health care provider approves) if  you develop constipation. Eat more high-fiber foods, such as fresh vegetables or fruit and whole grains. Drink plenty of fluids to keep your urine clear or pale yellow.  Take warm sitz baths to soothe any pain or discomfort caused by hemorrhoids. Use hemorrhoid cream if your health care provider approves.  If you develop varicose veins, wear support hose. Elevate your feet for 15 minutes, 3-4 times a day. Limit salt in your diet.  Avoid heavy lifting, wear low heel shoes, and practice good posture.  Rest with your legs elevated if you have leg cramps or low back pain.  Visit your dentist if you have not gone yet during your pregnancy. Use a soft toothbrush to brush your teeth and be gentle when you floss.  A sexual relationship may be continued unless your health care provider directs you otherwise.  Continue to go to all your prenatal visits as directed by your health care provider. SEEK MEDICAL CARE IF:   You have dizziness.  You have mild pelvic cramps, pelvic pressure, or nagging pain in the abdominal area.  You have persistent nausea, vomiting, or diarrhea.  You have a bad smelling vaginal discharge.  You have pain with urination. SEEK IMMEDIATE MEDICAL CARE IF:   You have a fever.  You are leaking fluid from your vagina.  You have spotting or bleeding from your vagina.  You have severe abdominal cramping or pain.  You have rapid weight gain or loss.  You have shortness of breath with chest pain.  You notice sudden or extreme swelling of your face, hands, ankles, feet, or legs.  You have not felt your baby move in over an hour.  You have severe headaches that do not go away with medicine.  You have vision changes.   This information is not intended to replace advice given to you by your health care provider. Make sure you discuss any questions you have with your health care provider.   Document Released: 02/27/2001 Document Revised: 03/26/2014 Document Reviewed:  05/06/2012 Elsevier Interactive Patient Education [employer]2016 Elsevier Inc.

## 2015-06-24 NOTE — Progress Notes (Signed)
Patient complaining of morning sickness Armandina StammerJennifer Howard RN BSN
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
## 2015-06-30 ENCOUNTER — Other Ambulatory Visit: Payer: Self-pay | Admitting: Family

## 2015-06-30 ENCOUNTER — Ambulatory Visit (HOSPITAL_COMMUNITY)
Admission: RE | Admit: 2015-06-30 | Discharge: 2015-06-30 | Disposition: A | Discharge status: Home / Self Care | Payer: Managed Care, Other (non HMO) | Source: Ambulatory Visit | Attending: Family | Admitting: Family

## 2015-06-30 ENCOUNTER — Encounter (HOSPITAL_COMMUNITY): Payer: Self-pay

## 2015-06-30 DIAGNOSIS — Z36 Encounter for antenatal screening of mother: Secondary | ICD-10-CM | POA: Insufficient documentation

## 2015-06-30 DIAGNOSIS — Z3481 Encounter for supervision of other normal pregnancy, first trimester: Secondary | ICD-10-CM

## 2015-06-30 DIAGNOSIS — Z3A12 12 weeks gestation of pregnancy: Secondary | ICD-10-CM

## 2015-06-30 DIAGNOSIS — Z369 Encounter for antenatal screening, unspecified: Secondary | ICD-10-CM

## 2015-07-11 ENCOUNTER — Encounter: Payer: Self-pay | Admitting: *Deleted

## 2015-07-19 ENCOUNTER — Other Ambulatory Visit (HOSPITAL_COMMUNITY): Payer: Self-pay

## 2015-07-22 ENCOUNTER — Ambulatory Visit (INDEPENDENT_AMBULATORY_CARE_PROVIDER_SITE_OTHER): Payer: Managed Care, Other (non HMO) | Admitting: Family

## 2015-07-22 VITALS — BP 109/73 | HR 85 | Wt 165.0 lb

## 2015-07-22 DIAGNOSIS — Z36 Encounter for antenatal screening of mother: Secondary | ICD-10-CM | POA: Diagnosis not present

## 2015-07-22 DIAGNOSIS — Z3482 Encounter for supervision of other normal pregnancy, second trimester: Secondary | ICD-10-CM

## 2015-07-22 NOTE — Progress Notes (Signed)
Subjective:  Laura [name] is a 20 y.o. G3P1011 at 231w0d being seen today for ongoing prenatal care.  She is currently monitored for the following issues for this low-risk pregnancy and has Supervision of normal pregnancy, antepartum on her problem list.  Patient reports no complaints.  Contractions: Not present. Vag. Bleeding: None.  Movement: Absent. Denies leaking of fluid.   The following portions of the patient's history were reviewed and updated as appropriate: allergies, current medications, past family history, past medical history, past social history, past surgical history and problem list. Problem list updated.  Objective:   Filed Vitals:   07/22/15 0951  BP: 109/73  Pulse: 85  Weight: 165 lb (74.844 kg)    Fetal Status: Fetal Heart Rate (bpm): 133 Fundal Height: 18 cm Movement: Absent     General:  Alert, oriented and cooperative. Patient is in no acute distress.  Skin: Skin is warm and dry. No rash noted.   Cardiovascular: Normal heart rate noted  Respiratory: Normal respiratory effort, no problems with respiration noted  Abdomen: Soft, gravid, appropriate for gestational age. Pain/Pressure: Present     Pelvic: Vag. Bleeding: None Vag D/C Character: Thin   Cervical exam deferred        Extremities: Normal range of motion.  Edema: None  Mental Status: Normal mood and affect. Normal behavior. Normal judgment and thought content.   Urinalysis: Urine Protein: Negative Urine Glucose: Negative  Assessment and Plan:  Pregnancy: G3P1011 at 2431w0d  1. Supervision of normal pregnancy, antepartum, second trimester - Reviewed lab results - Alpha fetoprotein, maternal - US MFM OB COMP + 14 WK; Future  General obstetric precautions including but not limited to vaginal bleeding and pelvic pain reviewed in detail with the patient. Please refer to After Visit Summary for other counseling recommendations.  Return in about 4 weeks (around 08/19/2015).   Eino FarberWalidah Kennith GainN Karim,  CNM

## 2015-07-26 LAB — ALPHA FETOPROTEIN, MATERNAL
AFP: 34 ng/mL
CURR GEST AGE: 16 wk
MoM for AFP: 1.07
OPEN SPINA BIFIDA: NEGATIVE

## 2015-08-07 ENCOUNTER — Inpatient Hospital Stay (HOSPITAL_COMMUNITY)
Admission: AD | Admit: 2015-08-07 | Discharge: 2015-08-07 | Disposition: A | Discharge status: Home / Self Care | Payer: Medicaid Other | Source: Ambulatory Visit | Attending: Obstetrics and Gynecology | Admitting: Obstetrics and Gynecology

## 2015-08-07 ENCOUNTER — Encounter (HOSPITAL_COMMUNITY): Payer: Self-pay | Admitting: *Deleted

## 2015-08-07 DIAGNOSIS — M549 Dorsalgia, unspecified: Secondary | ICD-10-CM

## 2015-08-07 DIAGNOSIS — Z3A18 18 weeks gestation of pregnancy: Secondary | ICD-10-CM | POA: Diagnosis not present

## 2015-08-07 DIAGNOSIS — R252 Cramp and spasm: Secondary | ICD-10-CM | POA: Diagnosis not present

## 2015-08-07 DIAGNOSIS — O99891 Other specified diseases and conditions complicating pregnancy: Secondary | ICD-10-CM

## 2015-08-07 DIAGNOSIS — O26892 Other specified pregnancy related conditions, second trimester: Secondary | ICD-10-CM

## 2015-08-07 DIAGNOSIS — O9989 Other specified diseases and conditions complicating pregnancy, childbirth and the puerperium: Secondary | ICD-10-CM

## 2015-08-07 LAB — URINALYSIS, ROUTINE W REFLEX MICROSCOPIC
BILIRUBIN URINE: NEGATIVE
GLUCOSE, UA: NEGATIVE mg/dL
Hgb urine dipstick: NEGATIVE
KETONES UR: NEGATIVE mg/dL
LEUKOCYTES UA: NEGATIVE
NITRITE: NEGATIVE
PROTEIN: NEGATIVE mg/dL
Specific Gravity, Urine: 1.01 (ref 1.005–1.030)
pH: 5.5 (ref 5.0–8.0)

## 2015-08-07 MED ORDER — CYCLOBENZAPRINE HCL 10 MG PO TABS
10.0000 mg | ORAL_TABLET | Freq: Once | ORAL | Status: AC
  Administered 2015-08-07: 10 mg via ORAL
  Filled 2015-08-07: qty 1

## 2015-08-07 MED ORDER — CYCLOBENZAPRINE HCL 10 MG PO TABS: 10.0000 mg | ORAL_TABLET | Freq: Two times a day (BID) | ORAL | Status: DC | PRN

## 2015-08-07 NOTE — MAU Note (Signed)
Pt states her lower back started hurting yesterday am and has worsened today and is now having lower abd tightening.
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
## 2015-08-08 ENCOUNTER — Ambulatory Visit (HOSPITAL_COMMUNITY)
Admission: RE | Admit: 2015-08-08 | Discharge: 2015-08-08 | Disposition: A | Discharge status: Home / Self Care | Payer: Medicaid Other | Source: Ambulatory Visit | Attending: Family | Admitting: Family

## 2015-08-08 ENCOUNTER — Ambulatory Visit (HOSPITAL_COMMUNITY): Payer: Managed Care, Other (non HMO)

## 2015-08-08 ENCOUNTER — Other Ambulatory Visit: Payer: Self-pay | Admitting: Family

## 2015-08-08 DIAGNOSIS — Z3A18 18 weeks gestation of pregnancy: Secondary | ICD-10-CM | POA: Insufficient documentation

## 2015-08-08 DIAGNOSIS — Z36 Encounter for antenatal screening of mother: Secondary | ICD-10-CM | POA: Insufficient documentation

## 2015-08-08 DIAGNOSIS — Z3482 Encounter for supervision of other normal pregnancy, second trimester: Secondary | ICD-10-CM

## 2015-08-08 DIAGNOSIS — Z3689 Encounter for other specified antenatal screening: Secondary | ICD-10-CM

## 2015-08-19 ENCOUNTER — Encounter: Payer: Managed Care, Other (non HMO) | Admitting: Family

## 2015-08-25 ENCOUNTER — Ambulatory Visit (INDEPENDENT_AMBULATORY_CARE_PROVIDER_SITE_OTHER): Payer: Medicaid Other | Admitting: Obstetrics & Gynecology

## 2015-08-25 VITALS — BP 114/61 | HR 77 | Wt 175.0 lb

## 2015-08-25 DIAGNOSIS — Z3482 Encounter for supervision of other normal pregnancy, second trimester: Secondary | ICD-10-CM

## 2015-08-25 NOTE — Progress Notes (Signed)
Subjective:  Laura SheffieldRebecca Grabill is a 20 y.o. G3P1011 at 5362w6d being seen today for ongoing prenatal care.  She is currently monitored for the following issues for this low-risk pregnancy and has Supervision of normal pregnancy, antepartum on her problem list.  Patient reports no complaints.  Contractions: Not present. Vag. Bleeding: None.  Movement: Present. Denies leaking of fluid.   The following portions of the patient's history were reviewed and updated as appropriate: allergies, current medications, past family history, past medical history, past social history, past surgical history and problem list. Problem list updated.  Objective:   Filed Vitals:   08/25/15 1413  BP: 114/61  Pulse: 77  Weight: 175 lb (79.379 kg)    Fetal Status: Fetal Heart Rate (bpm): 129   Movement: Present     General:  Alert, oriented and cooperative. Patient is in no acute distress.  Skin: Skin is warm and dry. No rash noted.   Cardiovascular: Normal heart rate noted  Respiratory: Normal respiratory effort, no problems with respiration noted  Abdomen: Soft, gravid, appropriate for gestational age. Pain/Pressure: Absent     Pelvic: Vag. Bleeding: None Vag D/C Character: Thin   Cervical exam performed        Extremities: Normal range of motion.  Edema: None  Mental Status: Normal mood and affect. Normal behavior. Normal judgment and thought content.   Urinalysis: Urine Protein: Negative Urine Glucose: Negative  Assessment and Plan:  Pregnancy: G3P1011 at 3962w6d  There are no diagnoses linked to this encounter. Preterm labor symptoms and general obstetric precautions including but not limited to vaginal bleeding, contractions, leaking of fluid and fetal movement were reviewed in detail with the patient. Please refer to After Visit Summary for other counseling recommendations.  No Follow-up on file.   Allie BossierMyra C Josepha Barbier, MD
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
## 2015-09-10 ENCOUNTER — Inpatient Hospital Stay (HOSPITAL_COMMUNITY)
Admission: AD | Admit: 2015-09-10 | Discharge: 2015-09-10 | Disposition: A | Discharge status: Home / Self Care | Payer: Medicaid Other | Source: Ambulatory Visit | Attending: Obstetrics and Gynecology | Admitting: Obstetrics and Gynecology

## 2015-09-10 ENCOUNTER — Encounter (HOSPITAL_COMMUNITY): Payer: Self-pay | Admitting: *Deleted

## 2015-09-10 DIAGNOSIS — O26899 Other specified pregnancy related conditions, unspecified trimester: Secondary | ICD-10-CM

## 2015-09-10 DIAGNOSIS — Z87891 Personal history of nicotine dependence: Secondary | ICD-10-CM | POA: Diagnosis not present

## 2015-09-10 DIAGNOSIS — R109 Unspecified abdominal pain: Secondary | ICD-10-CM | POA: Diagnosis not present

## 2015-09-10 DIAGNOSIS — Z3A23 23 weeks gestation of pregnancy: Secondary | ICD-10-CM | POA: Diagnosis not present

## 2015-09-10 DIAGNOSIS — O26892 Other specified pregnancy related conditions, second trimester: Secondary | ICD-10-CM | POA: Diagnosis not present

## 2015-09-10 DIAGNOSIS — O9989 Other specified diseases and conditions complicating pregnancy, childbirth and the puerperium: Secondary | ICD-10-CM | POA: Diagnosis not present

## 2015-09-10 DIAGNOSIS — N898 Other specified noninflammatory disorders of vagina: Secondary | ICD-10-CM

## 2015-09-10 LAB — URINALYSIS W MICROSCOPIC (NOT AT ARMC)
BACTERIA UA: NONE SEEN
Bilirubin Urine: NEGATIVE
Glucose, UA: NEGATIVE mg/dL
Hgb urine dipstick: NEGATIVE
KETONES UR: NEGATIVE mg/dL
LEUKOCYTES UA: NEGATIVE
Nitrite: NEGATIVE
PROTEIN: NEGATIVE mg/dL
RBC / HPF: NONE SEEN RBC/hpf (ref 0–5)
Specific Gravity, Urine: <1.005 — ABNORMAL LOW (ref 1.005–1.030)
pH: 6 (ref 5.0–8.0)

## 2015-09-10 LAB — WET PREP, GENITAL
CLUE CELLS WET PREP: NONE SEEN
Sperm: NONE SEEN
TRICH WET PREP: NONE SEEN
YEAST WET PREP: NONE SEEN

## 2015-09-10 NOTE — MAU Provider Note (Signed)
MAU HISTORY AND PHYSICAL  Chief Complaint:  Vaginal Discharge and Contractions   Laura [name] is a 20 y.o.  G3P1011 with IUP at 1883w1d presenting for Vaginal Discharge and Contractions Pt states this occurred yesterday at around 2PM went to urinate and noted upon wiping, brownish/mucous like discharge.  This occurred only once and since then has felt some irregular contractions lasting less than a minute, up to 2-3 hours very mild.  States fetus has been moving frequently.  Last intercourse: 72 hours, non painful, no blood.  Prenatal risk factors: occasional 1/2 glass of wine, former smoker (quit upon dx of pregnancy) Patient states she has been having  irregular, every 30 minutes contractions, minimal vaginal bleeding, intact membranes, with active fetal movement.    Past Medical History  Diagnosis Date  . Medical history non-contributory     Past Surgical History  Procedure Laterality Date  . Cholecystectomy    . Appendectomy      History reviewed. No pertinent family history.  Social History  Substance Use Topics  . Smoking status: Former Games developermoker  . Smokeless tobacco: None  . Alcohol Use: No    Allergies  Allergen Reactions  . Divalproex Sodium Other (See Comments)    Reaction:  Memory loss   . Demerol [Meperidine] Hives    Prescriptions prior to admission  Medication Sig Dispense Refill Last Dose  . acetaminophen (TYLENOL) 500 MG tablet Take 500-1,000 mg by mouth every 6 (six) hours as needed for mild pain or headache.   Taking  . cyclobenzaprine (FLEXERIL) 10 MG tablet Take 1 tablet (10 mg total) by mouth 2 (two) times daily as needed for muscle spasms. 30 tablet 0 Taking  . loratadine (CLARITIN) 10 MG tablet Take 10 mg by mouth daily as needed for allergies.   Taking  . Prenatal Vit-Fe Fumarate-FA (PRENATAL MULTIVITAMIN) TABS tablet Take 1 tablet by mouth at bedtime.   Taking    Review of Systems - Negative except for what is mentioned in HPI.  Physical Exam   Blood pressure 111/76, pulse 77, temperature 98 F (36.7 C), resp. rate 18, height 5\' 7"  (1.702 m), weight 183 lb 3.2 oz (83.099 kg), last menstrual period 04/01/2015, unknown if currently breastfeeding. GENERAL: Well-developed, well-nourished female in no acute distress.  LUNGS: Clear to auscultation bilaterally.  HEART: Regular rate and rhythm. ABDOMEN: Soft, nontender, nondistended, gravid.  EXTREMITIES: Nontender, no edema, 2+ distal pulses. Cervical Exam:  Presentation: Unknown FHT:  Baseline  Contractions: None   Labs: No results found for this or any previous visit (from the past 24 hour(s)).  Imaging Studies:  No results found.  Assessment: Laura [name] is  20 y.o. G3P1011 at 6183w1d presents with Vaginal Discharge and Contractions .  Plan:  Olena LeatherwoodKelly M Aguilar 6/24/201711:06 PM  I spoke with and examined patient and agree with resident/PA/SNM's note and plan of care.  Mucousy brown d/c yesterday x 1 at 2pm- thought may be mucous plug, then started to have some cramping so thought she should get checked out. Denies malodorous d/c, itching/odor/irritation. Some urinary frequency, denies urgency/hesitancy/hematuria. Good fm, no lof or frank vb.  H/O term uncomplicated svb x 1 w/o h/o ptl. PNC @ [facility], next visit 6/30.   Spec exam: cx anterior, visually closed w/ good length so fFN not collected, small amt creamy white nonodorous d/c, no blood noted, wet prep and gc/ct collected SVE: outer os 1/inner os closed/thick/anterior FHR: 125, moderate, +accels, no decels=Cat 1 UCs: no uc's, occ ui  Results for  orders placed or performed during the hospital encounter of 09/10/15 (from the past 24 hour(s))  Urinalysis with microscopic (not at [facility])     Status: Abnormal   Collection Time: 09/10/15 10:00 PM  Result Value Ref Range   Color, Urine YELLOW YELLOW   APPearance CLEAR CLEAR   Specific Gravity, Urine <1.005 (L) 1.005 - 1.030   pH 6.0 5.0 - 8.0   Glucose, UA NEGATIVE  NEGATIVE mg/dL   Hgb urine dipstick NEGATIVE NEGATIVE   Bilirubin Urine NEGATIVE NEGATIVE   Ketones, ur NEGATIVE NEGATIVE mg/dL   Protein, ur NEGATIVE NEGATIVE mg/dL   Nitrite NEGATIVE NEGATIVE   Leukocytes, UA NEGATIVE NEGATIVE   WBC, UA 0-5 0 - 5 WBC/hpf   RBC / HPF NONE SEEN 0 - 5 RBC/hpf   Bacteria, UA NONE SEEN NONE SEEN   Squamous Epithelial / LPF 0-5 (A) NONE SEEN  Wet prep, genital     Status: Abnormal   Collection Time: 09/10/15 11:20 PM  Result Value Ref Range   Yeast Wet Prep HPF POC NONE SEEN NONE SEEN   Trich, Wet Prep NONE SEEN NONE SEEN   Clue Cells Wet Prep HPF POC NONE SEEN NONE SEEN   WBC, Wet Prep HPF POC MODERATE (A) NONE SEEN   Sperm NONE SEEN     A:   3771w1d SIUP  Vaginal discharge  Cramping  P:  D/C home  Reviewed ptl s/s, fm, reasons to return  Increase po fluids  Pelvic rest x7d from seeing blood of any color  Keep appt as scheduled 6/30 @ [facility]  Cheral MarkerKimberly R. Keene Gilkey, CNM, [facility] 09/10/2015 11:30 PM

## 2015-09-10 NOTE — MAU Note (Signed)
Urine sent to lab 

## 2015-09-10 NOTE — Discharge Instructions (Signed)
Call the clinic or go to [facility] if:  You begin to have strong, frequent contractions  Your water breaks.  Sometimes it is a big gush of fluid, sometimes it is just a trickle that keeps getting your panties wet or running down your legs  You have vaginal bleeding.  It is normal to have a small amount of spotting if your cervix was checked.   You don't feel your baby moving like normal.  If you don't, get you something to eat and drink and lay down and focus on feeling your baby move.  If your baby is still not moving like normal, call the clinic or go to [facility]  Preterm Labor Information Preterm labor is when labor starts at less than 37 weeks of pregnancy. The normal length of a pregnancy is 39 to 41 weeks. CAUSES Often, there is no identifiable underlying cause as to why a woman goes into preterm labor. One of the most common known causes of preterm labor is infection. Infections of the uterus, cervix, vagina, amniotic sac, bladder, kidney, or even the lungs (pneumonia) can cause labor to start. Other suspected causes of preterm labor include:   Urogenital infections, such as yeast infections and bacterial vaginosis.   Uterine abnormalities (uterine shape, uterine septum, fibroids, or bleeding from the placenta).   A cervix that has been operated on (it may fail to stay closed).   Malformations in the fetus.   Multiple gestations (twins, triplets, and so on).   Breakage of the amniotic sac.  RISK FACTORS  Having a previous history of preterm labor.   Having premature rupture of membranes (PROM).   Having a placenta that covers the opening of the cervix (placenta previa).   Having a placenta that separates from the uterus (placental abruption).   Having a cervix that is too weak to hold the fetus in the uterus (incompetent cervix).   Having too much fluid in the amniotic sac (polyhydramnios).   Taking illegal drugs or smoking while pregnant.    Not gaining enough weight while pregnant.   Being younger than 4318 and older than 22 years old.   Having a low socioeconomic status.   Being African American. SYMPTOMS Signs and symptoms of preterm labor include:   Menstrual-like cramps, abdominal pain, or back pain.  Uterine contractions that are regular, as frequent as six in an hour, regardless of their intensity (may be mild or painful).  Contractions that start on the top of the uterus and spread down to the lower abdomen and back.   A sense of increased pelvic pressure.   A watery or bloody mucus discharge that comes from the vagina.  TREATMENT Depending on the length of the pregnancy and other circumstances, your health care provider may suggest bed rest. If necessary, there are medicines that can be given to stop contractions and to mature the fetal lungs. If labor happens before 34 weeks of pregnancy, a prolonged hospital stay may be recommended. Treatment depends on the condition of both you and the fetus.  WHAT SHOULD YOU DO IF YOU THINK YOU ARE IN PRETERM LABOR? Call your health care provider right away. You will need to go to the hospital to get checked immediately. HOW CAN YOU PREVENT PRETERM LABOR IN FUTURE PREGNANCIES? You should:   Stop smoking if you smoke.  Maintain healthy weight gain and avoid chemicals and drugs that are not necessary.  Be watchful for any type of infection.  Inform your health care provider  if you have a known history of preterm labor.   This information is not intended to replace advice given to you by your health care provider. Make sure you discuss any questions you have with your health care provider.   Document Released: 05/26/2003 Document Revised: 11/05/2012 Document Reviewed: 04/07/2012 Elsevier Interactive Patient Education [employer]2016 [employer] Inc.

## 2015-09-10 NOTE — MAU Note (Signed)
Had some thick vag d/c and ctx yesterday and more intense today. Some vag tightening

## 2015-09-12 LAB — GC/CHLAMYDIA PROBE AMP (~~LOC~~) NOT AT ARMC
CHLAMYDIA, DNA PROBE: NEGATIVE
Neisseria Gonorrhea: NEGATIVE

## 2015-09-16 ENCOUNTER — Encounter: Payer: Medicaid Other | Admitting: Certified Nurse Midwife

## 2015-09-26 ENCOUNTER — Ambulatory Visit (INDEPENDENT_AMBULATORY_CARE_PROVIDER_SITE_OTHER): Payer: Medicaid Other | Admitting: Certified Nurse Midwife

## 2015-09-26 VITALS — BP 109/66 | HR 77 | Wt 183.0 lb

## 2015-09-26 DIAGNOSIS — O2602 Excessive weight gain in pregnancy, second trimester: Secondary | ICD-10-CM

## 2015-09-26 DIAGNOSIS — Z3482 Encounter for supervision of other normal pregnancy, second trimester: Secondary | ICD-10-CM

## 2015-09-26 DIAGNOSIS — O2600 Excessive weight gain in pregnancy, unspecified trimester: Secondary | ICD-10-CM | POA: Insufficient documentation

## 2015-09-26 NOTE — Progress Notes (Signed)
Subjective:  Laura [name] is a 20 y.o. G3P1011 at 739w3d being seen today for ongoing prenatal care.  She is currently monitored for the following issues for this low-risk pregnancy and has Supervision of normal pregnancy, antepartum and Excess weight gain in pregnancy on her problem list.  Patient reports no complaints.  Contractions: Not present. Vag. Bleeding: None.  Movement: Present. Denies leaking of fluid.   The following portions of the patient's history were reviewed and updated as appropriate: allergies, current medications, past family history, past medical history, past social history, past surgical history and problem list. Problem list updated.  Objective:   Filed Vitals:   09/26/15 0908  BP: 109/66  Pulse: 77  Weight: 183 lb (83.008 kg)    Fetal Status: Fetal Heart Rate (bpm): 129   Movement: Present     General:  Alert, oriented and cooperative. Patient is in no acute distress.  Skin: Skin is warm and dry. No rash noted.   Cardiovascular: Normal heart rate noted  Respiratory: Normal respiratory effort, no problems with respiration noted  Abdomen: Soft, gravid, appropriate for gestational age. Pain/Pressure: Absent     Pelvic: Vag. Bleeding: None Vag D/C Character: Thin   Cervical exam deferred        Extremities: Normal range of motion.  Edema: None  Mental Status: Normal mood and affect. Normal behavior. Normal judgment and thought content.   Urinalysis: Urine Protein: Negative Urine Glucose: Negative  Assessment and Plan:  Pregnancy: G3P1011 at 5939w3d  1. Supervision of normal pregnancy, antepartum, second trimester -interested in waterbirth, must attend class-info provided -GTT and 28 wk labs next visit  2. Excess weight gain in pregnancy, second trimester -discussed diet modification and regular exercise (4-5 times per wk)  Preterm labor symptoms and general obstetric precautions including but not limited to vaginal bleeding, contractions, leaking of  fluid and fetal movement were reviewed in detail with the patient. Please refer to After Visit Summary for other counseling recommendations.  Return in about 3 weeks (around 10/17/2015).   Donette LarryMelanie Alaia Lordi, CNM

## 2015-10-17 ENCOUNTER — Ambulatory Visit (INDEPENDENT_AMBULATORY_CARE_PROVIDER_SITE_OTHER): Payer: Medicaid Other | Admitting: Advanced Practice Midwife

## 2015-10-17 VITALS — BP 116/71 | HR 76 | Wt 189.0 lb

## 2015-10-17 DIAGNOSIS — Z36 Encounter for antenatal screening of mother: Secondary | ICD-10-CM

## 2015-10-17 DIAGNOSIS — Z3483 Encounter for supervision of other normal pregnancy, third trimester: Secondary | ICD-10-CM

## 2015-10-17 DIAGNOSIS — Z23 Encounter for immunization: Secondary | ICD-10-CM | POA: Diagnosis not present

## 2015-10-17 DIAGNOSIS — Z3493 Encounter for supervision of normal pregnancy, unspecified, third trimester: Secondary | ICD-10-CM

## 2015-10-17 NOTE — Patient Instructions (Signed)

## 2015-10-17 NOTE — Progress Notes (Signed)
Patient ID: Laura [name], female   DOB: [birth year], 20 y.o.   MRN: [id number]

## 2015-10-17 NOTE — Progress Notes (Signed)
Subjective:  Laura [name] is a 20 y.o. G3P1011 at [redacted]w[redacted]d being seen today for ongoing prenatal care.  She is currently monitored for the following issues for this low-risk pregnancy and has Supervision of normal pregnancy, antepartum and Excess weight gain in pregnancy on her problem list.  Patient reports no complaints.  Contractions: Irritability. Vag. Bleeding: None.  Movement: Present. Denies leaking of fluid.   The following portions of the patient's history were reviewed and updated as appropriate: allergies, current medications, past family history, past medical history, past social history, past surgical history and problem list. Problem list updated.  Objective:   Vitals:   10/17/15 1009  BP: 116/71  Pulse: 76  Weight: 189 lb (85.7 kg)    Fetal Status: Fetal Heart Rate (bpm): 134   Movement: Present     General:  Alert, oriented and cooperative. Patient is in no acute distress.  Skin: Skin is warm and dry. No rash noted.   Cardiovascular: Normal heart rate noted  Respiratory: Normal respiratory effort, no problems with respiration noted  Abdomen: Soft, gravid, appropriate for gestational age. Pain/Pressure: Absent     Pelvic:  Cervical exam deferred        Extremities: Normal range of motion.     Mental Status: Normal mood and affect. Normal behavior. Normal judgment and thought content.   Urinalysis: Urine Protein: Trace Urine Glucose: Negative  Assessment and Plan:  Pregnancy: G3P1011 at [redacted]w[redacted]d  1. Normal pregnancy in third trimester       - Glucose Tolerance, 1 HR (50g) - CBC - HIV antibody (with reflex) - RPR - Tdap vaccine greater than or equal to 7yo IM  Preterm labor symptoms and general obstetric precautions including but not limited to vaginal bleeding, contractions, leaking of fluid and fetal movement were reviewed in detail with the patient. Please refer to After Visit Summary for other counseling recommendations.  RTO 2 weeks  Aviva Signs,  CNM

## 2015-10-18 LAB — HIV ANTIBODY (ROUTINE TESTING W REFLEX): HIV 1&2 Ab, 4th Generation: NONREACTIVE

## 2015-10-18 LAB — CBC
HEMATOCRIT: 33.4 % — AB (ref 35.0–45.0)
Hemoglobin: 10.9 g/dL — ABNORMAL LOW (ref 11.7–15.5)
MCH: 29.9 pg (ref 27.0–33.0)
MCHC: 32.6 g/dL (ref 32.0–36.0)
MCV: 91.5 fL (ref 80.0–100.0)
MPV: 10 fL (ref 7.5–12.5)
PLATELETS: 197 10*3/uL (ref 140–400)
RBC: 3.65 MIL/uL — AB (ref 3.80–5.10)
RDW: 12.7 % (ref 11.0–15.0)
WBC: 5.7 10*3/uL (ref 3.8–10.8)

## 2015-10-18 LAB — GLUCOSE TOLERANCE, 1 HOUR (50G) W/O FASTING: Glucose, 1 Hr, gestational: 74 mg/dL (ref <140)

## 2015-10-19 ENCOUNTER — Telehealth: Payer: Self-pay | Admitting: *Deleted

## 2015-10-19 DIAGNOSIS — Z3482 Encounter for supervision of other normal pregnancy, second trimester: Secondary | ICD-10-CM

## 2015-10-19 LAB — RPR

## 2015-10-19 NOTE — Telephone Encounter (Signed)
Pt notified of normal 1 hr GTT. 

## 2015-10-31 ENCOUNTER — Encounter: Payer: Medicaid Other | Admitting: Certified Nurse Midwife

## 2015-11-04 ENCOUNTER — Ambulatory Visit (INDEPENDENT_AMBULATORY_CARE_PROVIDER_SITE_OTHER): Payer: Medicaid Other | Admitting: Advanced Practice Midwife

## 2015-11-04 VITALS — BP 120/77 | HR 89 | Wt 195.0 lb

## 2015-11-04 DIAGNOSIS — Z3493 Encounter for supervision of normal pregnancy, unspecified, third trimester: Secondary | ICD-10-CM

## 2015-11-04 DIAGNOSIS — O4693 Antepartum hemorrhage, unspecified, third trimester: Secondary | ICD-10-CM

## 2015-11-04 NOTE — Progress Notes (Signed)
Subjective:  Laura [name] is a 20 y.o. G3P1011 at 6972w0d being seen today for ongoing prenatal care.  She is currently monitored for the following issues for this low-risk pregnancy and has Supervision of normal pregnancy, antepartum and Excess weight gain in pregnancy on her problem list.  Patient reports occasional cramping and pink spotting 2 days after intercourse occuring 3 days go, resolved.  Contractions: Irregular. Vag. Bleeding: None.  Movement: Present. Denies leaking of fluid.   The following portions of the patient's history were reviewed and updated as appropriate: allergies, current medications, past family history, past medical history, past social history, past surgical history and problem list. Problem list updated.  Objective:   Vitals:   11/04/15 0858  BP: 120/77  Pulse: 89  Weight: 195 lb (88.5 kg)    Fetal Status: Fetal Heart Rate (bpm): 132 Fundal Height: 32 cm Movement: Present     General:  Alert, oriented and cooperative. Patient is in no acute distress.  Skin: Skin is warm and dry. No rash noted.   Cardiovascular: Normal heart rate noted  Respiratory: Normal respiratory effort, no problems with respiration noted  Abdomen: Soft, gravid, appropriate for gestational age. Pain/Pressure: Present     Pelvic:  Cervical exam deferred        Extremities: Normal range of motion.  Edema: None  Mental Status: Normal mood and affect. Normal behavior. Normal judgment and thought content.   Urinalysis: Urine Protein: Trace Urine Glucose: Negative  Assessment and Plan:  Pregnancy: G3P1011 at 2472w0d  1. Supervision of normal pregnancy, third trimester   2. Vaginal bleeding in pregnancy, third trimester --Intercourse 2 days prior to bleeding, no bleeding now or x 2-3 days. No vaginal itching/burning/discharge.  Bleeding precautions/reasons to come to hospital reviewed.  Preterm labor symptoms and general obstetric precautions including but not limited to vaginal  bleeding, contractions, leaking of fluid and fetal movement were reviewed in detail with the patient. Please refer to After Visit Summary for other counseling recommendations.  Return in about 2 weeks (around 11/18/2015).   Hurshel PartyLisa A Leftwich-Kirby, CNM

## 2015-11-04 NOTE — Patient Instructions (Signed)

## 2015-11-04 NOTE — Progress Notes (Signed)
Pt states that she had discharge with a pink like tint but, she believes it was from intercourse

## 2015-11-18 ENCOUNTER — Ambulatory Visit (INDEPENDENT_AMBULATORY_CARE_PROVIDER_SITE_OTHER): Payer: Medicaid Other | Admitting: Advanced Practice Midwife

## 2015-11-18 DIAGNOSIS — O26893 Other specified pregnancy related conditions, third trimester: Secondary | ICD-10-CM

## 2015-11-18 DIAGNOSIS — Z3483 Encounter for supervision of other normal pregnancy, third trimester: Secondary | ICD-10-CM

## 2015-11-18 DIAGNOSIS — O9989 Other specified diseases and conditions complicating pregnancy, childbirth and the puerperium: Secondary | ICD-10-CM

## 2015-11-18 DIAGNOSIS — O99891 Other specified diseases and conditions complicating pregnancy: Secondary | ICD-10-CM

## 2015-11-18 DIAGNOSIS — Z3493 Encounter for supervision of normal pregnancy, unspecified, third trimester: Secondary | ICD-10-CM

## 2015-11-18 DIAGNOSIS — M549 Dorsalgia, unspecified: Secondary | ICD-10-CM

## 2015-11-18 NOTE — Progress Notes (Signed)
   PRENATAL VISIT NOTE  Subjective:  Laura SheffieldRebecca Hemenway is a 20 y.o. G3P1011 at 1042w0d being seen today for ongoing prenatal care.  She is currently monitored for the following issues for this low-risk pregnancy and has Supervision of normal pregnancy, antepartum and Excess weight gain in pregnancy on her problem list.  Patient reports backache.  Contractions: Irregular. Vag. Bleeding: None.  Movement: Present. Denies leaking of fluid.   The following portions of the patient's history were reviewed and updated as appropriate: allergies, current medications, past family history, past medical history, past social history, past surgical history and problem list. Problem list updated.  Objective:   Vitals:   11/18/15 0856  BP: 111/74  Pulse: 71  Weight: 198 lb (89.8 kg)    Fetal Status: Fetal Heart Rate (bpm): 147 Fundal Height: 33 cm Movement: Present     General:  Alert, oriented and cooperative. Patient is in no acute distress.  Skin: Skin is warm and dry. No rash noted.   Cardiovascular: Normal heart rate noted  Respiratory: Normal respiratory effort, no problems with respiration noted  Abdomen: Soft, gravid, appropriate for gestational age. Pain/Pressure: Present     Pelvic:  Cervical exam performed Dilation: Closed Effacement (%): 0 Station: -3  Extremities: Normal range of motion.  Edema: None  Mental Status: Normal mood and affect. Normal behavior. Normal judgment and thought content.   Urinalysis: Urine Protein: Negative Urine Glucose: Negative  Assessment and Plan:  Pregnancy: G3P1011 at 4842w0d  1. Supervision of normal pregnancy, antepartum, third trimester   2. Supervision of normal pregnancy, third trimester   3. Back pain affecting pregnancy in third trimester   Preterm labor symptoms and general obstetric precautions including but not limited to vaginal bleeding, contractions, leaking of fluid and fetal movement were reviewed in detail with the patient. Please  refer to After Visit Summary for other counseling recommendations.  F/U 2 weeks  Waterbirth Class certificate scanned Comfort measures Flexeril at night sparingly   Dorathy KinsmanVirginia Melik Blancett, CNM

## 2015-11-18 NOTE — Patient Instructions (Signed)
Braxton Hicks Contractions °Contractions of the uterus can occur throughout pregnancy. Contractions are not always a sign that you are in labor.  °WHAT ARE BRAXTON HICKS CONTRACTIONS?  °Contractions that occur before labor are called Braxton Hicks contractions, or false labor. Toward the end of pregnancy (32-34 weeks), these contractions can develop more often and may become more forceful. This is not true labor because these contractions do not result in opening (dilatation) and thinning of the cervix. They are sometimes difficult to tell apart from true labor because these contractions can be forceful and people have different pain tolerances. You should not feel embarrassed if you go to the hospital with false labor. Sometimes, the only way to tell if you are in true labor is for your health care provider to look for changes in the cervix. °If there are no prenatal problems or other health problems associated with the pregnancy, it is completely safe to be sent home with false labor and await the onset of true labor. °HOW CAN YOU TELL THE DIFFERENCE BETWEEN TRUE AND FALSE LABOR? °False Labor °· The contractions of false labor are usually shorter and not as hard as those of true labor.   °· The contractions are usually irregular.   °· The contractions are often felt in the front of the lower abdomen and in the groin.   °· The contractions may go away when you walk around or change positions while lying down.   °· The contractions get weaker and are shorter lasting as time goes on.   °· The contractions do not usually become progressively stronger, regular, and closer together as with true labor.   °True Labor °· Contractions in true labor last 30-70 seconds, become very regular, usually become more intense, and increase in frequency.   °· The contractions do not go away with walking.   °· The discomfort is usually felt in the top of the uterus and spreads to the lower abdomen and low back.   °· True labor can be  determined by your health care provider with an exam. This will show that the cervix is dilating and getting thinner.   °WHAT TO REMEMBER °· Keep up with your usual exercises and follow other instructions given by your health care provider.   °· Take medicines as directed by your health care provider.   °· Keep your regular prenatal appointments.   °· Eat and drink lightly if you think you are going into labor.   °· If Braxton Hicks contractions are making you uncomfortable:   °¨ Change your position from lying down or resting to walking, or from walking to resting.   °¨ Sit and rest in a tub of warm water.   °¨ Drink 2-3 glasses of water. Dehydration may cause these contractions.   °¨ Do slow and deep breathing several times an hour.   °WHEN SHOULD I SEEK IMMEDIATE MEDICAL CARE? °Seek immediate medical care if: °· Your contractions become stronger, more regular, and closer together.   °· You have fluid leaking or gushing from your vagina.   °· You have a fever.   °· You pass blood-tinged mucus.   °· You have vaginal bleeding.   °· You have continuous abdominal pain.   °· You have low back pain that you never had before.   °· You feel your baby's head pushing down and causing pelvic pressure.   °· Your baby is not moving as much as it used to.   °  °This information is not intended to replace advice given to you by your health care provider. Make sure you discuss any questions you have with your health care   provider. °  °Document Released: 03/05/2005 Document Revised: 03/10/2013 Document Reviewed: 12/15/2012 °Elsevier Interactive Patient Education ©2016 [employer] Inc. ° °

## 2015-11-18 NOTE — Progress Notes (Signed)
Pt states that "back spasms" have increased

## 2015-12-02 ENCOUNTER — Ambulatory Visit (INDEPENDENT_AMBULATORY_CARE_PROVIDER_SITE_OTHER): Payer: Medicaid Other | Admitting: Obstetrics and Gynecology

## 2015-12-02 VITALS — BP 105/67 | HR 82 | Wt 206.0 lb

## 2015-12-02 DIAGNOSIS — Z3483 Encounter for supervision of other normal pregnancy, third trimester: Secondary | ICD-10-CM

## 2015-12-02 NOTE — Progress Notes (Signed)
12.7 

## 2015-12-02 NOTE — Patient Instructions (Signed)

## 2015-12-02 NOTE — Progress Notes (Signed)
Subjective:    Laura SheffieldRebecca [name] is a 20 y.o. female being seen today for her obstetrical visit. She is at 208w0d gestation. Patient reports no complaints. Fetal movement: normal.  Review of Systems:   Review of Systems  Constitutional: Negative for fatigue and unexpected weight change.  Gastrointestinal: Negative for abdominal pain.  Genitourinary: Negative for dysuria.     Objective:    BP 105/67   Pulse 82   Wt 206 lb (93.4 kg)   LMP 04/01/2015   BMI 32.26 kg/m   Physical Exam  Nursing note and vitals reviewed. Constitutional: She is oriented to person, place, and time. She appears well-developed and well-nourished. No distress.  HENT:  Head: Normocephalic.  Neck: Neck supple.  Neurological: She is alert and oriented to person, place, and time.  Skin: Skin is warm and dry.  Psychiatric: She has a normal mood and affect. Her behavior is normal.    Exam  FHT:  127 BPM  Uterine Size: 37 cm  Presentation: cephalic     Assessment:    Pregnancy:  G3P1011    Plan:    Patient Active Problem List   Diagnosis Date Noted  . Excess weight gain in pregnancy 09/26/2015  . Supervision of normal pregnancy, antepartum 05/27/2015    Infant feeding: plans to breastfeed, Discussed Nexplanon. Maternity leave: 12 wks unpaid from [employer]. [employer]on planned Follow up in 1 wk with GBS screen.

## 2015-12-09 ENCOUNTER — Other Ambulatory Visit (HOSPITAL_COMMUNITY)
Admission: RE | Admit: 2015-12-09 | Discharge: 2015-12-09 | Disposition: A | Discharge status: Home / Self Care | Payer: Medicaid Other | Source: Ambulatory Visit | Attending: Obstetrics and Gynecology | Admitting: Obstetrics and Gynecology

## 2015-12-09 ENCOUNTER — Encounter: Payer: Self-pay | Admitting: Obstetrics and Gynecology

## 2015-12-09 ENCOUNTER — Ambulatory Visit (INDEPENDENT_AMBULATORY_CARE_PROVIDER_SITE_OTHER): Payer: Medicaid Other | Admitting: Obstetrics and Gynecology

## 2015-12-09 VITALS — BP 110/73 | HR 80 | Wt 208.0 lb

## 2015-12-09 DIAGNOSIS — Z3493 Encounter for supervision of normal pregnancy, unspecified, third trimester: Secondary | ICD-10-CM

## 2015-12-09 DIAGNOSIS — Z23 Encounter for immunization: Secondary | ICD-10-CM

## 2015-12-09 DIAGNOSIS — Z113 Encounter for screening for infections with a predominantly sexual mode of transmission: Secondary | ICD-10-CM | POA: Diagnosis not present

## 2015-12-09 DIAGNOSIS — Z3483 Encounter for supervision of other normal pregnancy, third trimester: Secondary | ICD-10-CM

## 2015-12-09 NOTE — Patient Instructions (Signed)
Pre th tr Third Trimester of Pregnancy The third trimester is from week 29 through week 42, months 7 through 9. The third trimester is a time when the fetus is growing rapidly. At the end of the ninth month, the fetus is about 20 inches in length and weighs 6-10 pounds.  BODY CHANGES Your body goes through many changes during pregnancy. The changes vary from woman to woman.   Your weight will continue to increase. You can expect to gain 25-35 pounds (11-16 kg) by the end of the pregnancy.  You may begin to get stretch marks on your hips, abdomen, and breasts.  You may urinate more often because the fetus is moving lower into your pelvis and pressing on your bladder.  You may develop or continue to have heartburn as a result of your pregnancy.  You may develop constipation because certain hormones are causing the muscles that push waste through your intestines to slow down.  You may develop hemorrhoids or swollen, bulging veins (varicose veins).  You may have pelvic pain because of the weight gain and pregnancy hormones relaxing your joints between the bones in your pelvis. Backaches may result from overexertion of the muscles supporting your posture.  You may have changes in your hair. These can include thickening of your hair, rapid growth, and changes in texture. Some women also have hair loss during or after pregnancy, or hair that feels dry or thin. Your hair will most likely return to normal after your baby is born.  Your breasts will continue to grow and be tender. A yellow discharge may leak from your breasts called colostrum.  Your belly button may stick out.  You may feel short of breath because of your expanding uterus.  You may notice the fetus "dropping," or moving lower in your abdomen.  You may have a bloody mucus discharge. This usually occurs a few days to a week before labor begins.  Your cervix becomes thin and soft (effaced) near your due date. WHAT TO EXPECT AT  YOUR PRENATAL EXAMS  You will have prenatal exams every 2 weeks until week 36. Then, you will have weekly prenatal exams. During a routine prenatal visit:  You will be weighed to make sure you and the fetus are growing normally.  Your blood pressure is taken.  Your abdomen will be measured to track your baby's growth.  The fetal heartbeat will be listened to.  Any test results from the previous visit will be discussed.  You may have a cervical check near your due date to see if you have effaced. At around 36 weeks, your caregiver will check your cervix. At the same time, your caregiver will also perform a test on the secretions of the vaginal tissue. This test is to determine if a type of bacteria, Group B streptococcus, is present. Your caregiver will explain this further. Your caregiver may ask you:  What your birth plan is.  How you are feeling.  If you are feeling the baby move.  If you have had any abnormal symptoms, such as leaking fluid, bleeding, severe headaches, or abdominal cramping.  If you are using any tobacco products, including cigarettes, chewing tobacco, and electronic cigarettes.  If you have any questions. Other tests or screenings that may be performed during your third trimester include:  Blood tests that check for low iron levels (anemia).  Fetal testing to check the health, activity level, and growth of the fetus. Testing is done if you have certain medical  conditions or if there are problems during the pregnancy.  HIV (human immunodeficiency virus) testing. If you are at high risk, you may be screened for HIV during your third trimester of pregnancy. FALSE LABOR You may feel small, irregular contractions that eventually go away. These are called Braxton Hicks contractions, or false labor. Contractions may last for hours, days, or even weeks before true labor sets in. If contractions come at regular intervals, intensify, or become painful, it is best to be  seen by your caregiver.  SIGNS OF LABOR   Menstrual-like cramps.  Contractions that are 5 minutes apart or less.  Contractions that start on the top of the uterus and spread down to the lower abdomen and back.  A sense of increased pelvic pressure or back pain.  A watery or bloody mucus discharge that comes from the vagina. If you have any of these signs before the 37th week of pregnancy, call your caregiver right away. You need to go to the hospital to get checked immediately. HOME CARE INSTRUCTIONS   Avoid all smoking, herbs, alcohol, and unprescribed drugs. These chemicals affect the formation and growth of the baby.  Do not use any tobacco products, including cigarettes, chewing tobacco, and electronic cigarettes. If you need help quitting, ask your health care provider. You may receive counseling support and other resources to help you quit.  Follow your caregiver's instructions regarding medicine use. There are medicines that are either safe or unsafe to take during pregnancy.  Exercise only as directed by your caregiver. Experiencing uterine cramps is a good sign to stop exercising.  Continue to eat regular, healthy meals.  Wear a good support bra for breast tenderness.  Do not use hot tubs, steam rooms, or saunas.  Wear your seat belt at all times when driving.  Avoid raw meat, uncooked cheese, cat litter boxes, and soil used by cats. These carry germs that can cause birth defects in the baby.  Take your prenatal vitamins.  Take 1500-2000 mg of calcium daily starting at the 20th week of pregnancy until you deliver your baby.  Try taking a stool softener (if your caregiver approves) if you develop constipation. Eat more high-fiber foods, such as fresh vegetables or fruit and whole grains. Drink plenty of fluids to keep your urine clear or pale yellow.  Take warm sitz baths to soothe any pain or discomfort caused by hemorrhoids. Use hemorrhoid cream if your caregiver  approves.  If you develop varicose veins, wear support hose. Elevate your feet for 15 minutes, 3-4 times a day. Limit salt in your diet.  Avoid heavy lifting, wear low heal shoes, and practice good posture.  Rest a lot with your legs elevated if you have leg cramps or low back pain.  Visit your dentist if you have not gone during your pregnancy. Use a soft toothbrush to brush your teeth and be gentle when you floss.  A sexual relationship may be continued unless your caregiver directs you otherwise.  Do not travel far distances unless it is absolutely necessary and only with the approval of your caregiver.  Take prenatal classes to understand, practice, and ask questions about the labor and delivery.  Make a trial run to the hospital.  Pack your hospital bag.  Prepare the baby's nursery.  Continue to go to all your prenatal visits as directed by your caregiver. SEEK MEDICAL CARE IF:  You are unsure if you are in labor or if your water has broken.  You have dizziness.  You have mild pelvic cramps, pelvic pressure, or nagging pain in your abdominal area.  You have persistent nausea, vomiting, or diarrhea.  You have a bad smelling vaginal discharge.  You have pain with urination. SEEK IMMEDIATE MEDICAL CARE IF:   You have a fever.  You are leaking fluid from your vagina.  You have spotting or bleeding from your vagina.  You have severe abdominal cramping or pain.  You have rapid weight loss or gain.  You have shortness of breath with chest pain.  You notice sudden or extreme swelling of your face, hands, ankles, feet, or legs.  You have not felt your baby move in over an hour.  You have severe headaches that do not go away with medicine.  You have vision changes.   This information is not intended to replace advice given to you by your health care provider. Make sure you discuss any questions you have with your health care provider.   Document Released:  02/27/2001 Document Revised: 03/26/2014 Document Reviewed: 05/06/2012 Elsevier Interactive Patient Education [employer]2016 Elsevier Inc.

## 2015-12-09 NOTE — Progress Notes (Signed)
   PRENATAL VISIT NOTE  Subjective:  Laura [name] is a 20 y.o. G3P1011 at 7384w0d being seen today for ongoing prenatal care.  She is currently monitored for the following issues for this low-risk pregnancy and has Supervision of normal pregnancy, antepartum and Excess weight gain in pregnancy on her problem list.  Patient reports contractions since last week, increased intensity but intermittent. .  Contractions: Irritability. Vag. Bleeding: None.  Movement: Present. Denies leaking of fluid.   The following portions of the patient's history were reviewed and updated as appropriate: allergies, current medications, past family history, past medical history, past social history, past surgical history and problem list. Problem list updated.  Objective:   Vitals:   12/09/15 0820  BP: 110/73  Pulse: 80  Weight: 208 lb (94.3 kg)    Fetal Status: Fetal Heart Rate (bpm): 125   Movement: Present     General:  Alert, oriented and cooperative. Patient is in no acute distress.  Skin: Skin is warm and dry. No rash noted.   Cardiovascular: Normal heart rate noted  Respiratory: Normal respiratory effort, no problems with respiration noted  Abdomen: Soft, gravid, appropriate for gestational age. Pain/Pressure: Present     Pelvic:  Cervical exam performed        Extremities: Normal range of motion.  Edema: Trace  Mental Status: Normal mood and affect. Normal behavior. Normal judgment and thought content.   Urinalysis: Urine Protein: Negative Urine Glucose: Negative  Assessment and Plan:  Pregnancy: G3P1011 at 2584w0d  1. Normal pregnancy, third trimester Doing well. Prepared for waterbirth - Culture, beta strep (group b only) - Urine cytology ancillary only  2. Flu vaccine need given - Flu Vaccine QUAD 36+ mos IM (Fluarix, Quad PF) GBS done Preterm labor symptoms and general obstetric precautions including but not limited to vaginal bleeding, contractions, leaking of fluid and fetal  movement were reviewed in detail with the patient. Please refer to After Visit Summary for other counseling recommendations. Encouraged to speak to her support people about her preferences and plans.  Return in about 1 week (around 12/16/2015).  Danae Orleanseirdre C Pamella Samons, CNM

## 2015-12-09 NOTE — Progress Notes (Signed)
Flu Vaccine today 

## 2015-12-11 LAB — CULTURE, BETA STREP (GROUP B ONLY)

## 2015-12-12 LAB — URINE CYTOLOGY ANCILLARY ONLY
Chlamydia: NEGATIVE
Neisseria Gonorrhea: NEGATIVE

## 2015-12-13 ENCOUNTER — Encounter: Payer: Self-pay | Admitting: Obstetrics and Gynecology

## 2015-12-13 NOTE — Addendum Note (Signed)
Addended by: Danae OrleansPOE, Cyd Hostler C on: 12/13/2015 03:22 PM   Modules accepted: Kipp BroodSmartSet

## 2015-12-16 ENCOUNTER — Ambulatory Visit (INDEPENDENT_AMBULATORY_CARE_PROVIDER_SITE_OTHER): Payer: Medicaid Other | Admitting: Family

## 2015-12-16 DIAGNOSIS — Z3483 Encounter for supervision of other normal pregnancy, third trimester: Secondary | ICD-10-CM

## 2015-12-16 DIAGNOSIS — O321XX0 Maternal care for breech presentation, not applicable or unspecified: Secondary | ICD-10-CM

## 2015-12-16 NOTE — Progress Notes (Signed)
   PRENATAL VISIT NOTE  Subjective:  Laura [name] is a 20 y.o. G3P1011 at 4246w0d being seen today for ongoing prenatal care.  She is currently monitored for the following issues for this low-risk pregnancy and has Supervision of normal pregnancy, antepartum; Excess weight gain in pregnancy; and Breech presentation, antepartum on her problem list.  Patient reports occasional contractions.  Contractions: Irregular. Vag. Bleeding: None.  Movement: Present. Denies leaking of fluid.   The following portions of the patient's history were reviewed and updated as appropriate: allergies, current medications, past family history, past medical history, past social history, past surgical history and problem list. Problem list updated.  Objective:   Vitals:   12/16/15 1033  BP: 120/82  Pulse: 89  Weight: 210 lb (95.3 kg)    Fetal Status: Fetal Heart Rate (bpm): 151 Fundal Height: 40 cm Movement: Present  Presentation: Homero FellersFrank Breech  General:  Alert, oriented and cooperative. Patient is in no acute distress.  Skin: Skin is warm and dry. No rash noted.   Cardiovascular: Normal heart rate noted  Respiratory: Normal respiratory effort, no problems with respiration noted  Abdomen: Soft, gravid, appropriate for gestational age. Pain/Pressure: Present     Pelvic:  Cervical exam performed Dilation: 1 Effacement (%): Thick Station: -3  Extremities: Normal range of motion.  Edema: Trace  Mental Status: Normal mood and affect. Normal behavior. Normal judgment and thought content.   Urinalysis: Urine Protein: Trace Urine Glucose: Negative  Assessment and Plan:  Pregnancy: G3P1011 at 9446w0d  1. Supervision of normal pregnancy, antepartum, third trimester - GBS reviewed (negative)  2. Breech presentation, antepartum, not applicable or unspecified fetus - Version scheduled for 12/18/15 Penne Lash([place])  Term labor symptoms and general obstetric precautions including but not limited to vaginal bleeding,  contractions, leaking of fluid and fetal movement were reviewed in detail with the patient. Please refer to After Visit Summary for other counseling recommendations.  Return in about 1 week (around 12/23/2015).  Eino FarberWalidah Kennith GainN Karim, CNM

## 2015-12-16 NOTE — Progress Notes (Signed)
   PRENATAL VISIT NOTE  Subjective:  Laura [name] is a 20 y.o. G3P1011 at 6920w0d being seen today for ongoing prenatal care.  She is currently monitored for the following issues for this low-risk pregnancy and has Supervision of normal pregnancy, antepartum and Excess weight gain in pregnancy on her problem list.  Patient reports occasional contractions.  Contractions: Irregular. Vag. Bleeding: None.  Movement: Present. Denies leaking of fluid.   The following portions of the patient's history were reviewed and updated as appropriate: allergies, current medications, past family history, past medical history, past social history, past surgical history and problem list. Problem list updated.  Objective:   Vitals:   12/16/15 1033  BP: 120/82  Pulse: 89  Weight: 210 lb (95.3 kg)    Fetal Status: Fetal Heart Rate (bpm): 151 Fundal Height: 40 cm Movement: Present     General:  Alert, oriented and cooperative. Patient is in no acute distress.  Skin: Skin is warm and dry. No rash noted.   Cardiovascular: Normal heart rate noted  Respiratory: Normal respiratory effort, no problems with respiration noted  Abdomen: Soft, gravid, appropriate for gestational age. Pain/Pressure: Present     Pelvic:  Cervical exam performed Dilation: 1 Effacement (%): Thick Station: -3  Extremities: Normal range of motion.  Edema: Trace  Mental Status: Normal mood and affect. Normal behavior. Normal judgment and thought content.   Urinalysis: Urine Protein: Trace Urine Glucose: Negative  Assessment and Plan:  Pregnancy: G3P1011 at 6020w0d  1. Supervision of normal pregnancy, antepartum, third trimester -   Term labor symptoms and general obstetric precautions including but not limited to vaginal bleeding, contractions, leaking of fluid and fetal movement were reviewed in detail with the patient. Please refer to After Visit Summary for other counseling recommendations.  Return in about 1 week (around  12/23/2015).  Eino FarberWalidah Kennith GainN Karim, CNM

## 2015-12-18 ENCOUNTER — Inpatient Hospital Stay (HOSPITAL_COMMUNITY)
Admission: AD | Admit: 2015-12-18 | Discharge: 2015-12-18 | Disposition: A | Discharge status: Home Health | Payer: Medicaid Other | Source: Ambulatory Visit | Attending: Obstetrics & Gynecology | Admitting: Obstetrics & Gynecology

## 2015-12-18 ENCOUNTER — Encounter (HOSPITAL_COMMUNITY): Payer: Self-pay

## 2015-12-18 DIAGNOSIS — Z3A38 38 weeks gestation of pregnancy: Secondary | ICD-10-CM | POA: Insufficient documentation

## 2015-12-18 DIAGNOSIS — Z3483 Encounter for supervision of other normal pregnancy, third trimester: Secondary | ICD-10-CM | POA: Diagnosis present

## 2015-12-18 DIAGNOSIS — O321XX0 Maternal care for breech presentation, not applicable or unspecified: Secondary | ICD-10-CM

## 2015-12-18 MED ORDER — TERBUTALINE SULFATE 1 MG/ML IJ SOLN
INTRAMUSCULAR | Status: AC
  Filled 2015-12-18: qty 1

## 2015-12-18 MED ORDER — TERBUTALINE SULFATE 1 MG/ML IJ SOLN
0.2500 mg | Freq: Once | INTRAMUSCULAR | Status: AC
  Administered 2015-12-18: 0.25 mg via SUBCUTANEOUS

## 2015-12-20 ENCOUNTER — Encounter (HOSPITAL_COMMUNITY): Payer: Self-pay | Admitting: *Deleted

## 2015-12-20 ENCOUNTER — Inpatient Hospital Stay (HOSPITAL_COMMUNITY)
Admission: AD | Admit: 2015-12-20 | Discharge: 2015-12-20 | Disposition: A | Discharge status: Home / Self Care | Payer: Medicaid Other | Source: Ambulatory Visit | Attending: Obstetrics and Gynecology | Admitting: Obstetrics and Gynecology

## 2015-12-20 DIAGNOSIS — Z3A38 38 weeks gestation of pregnancy: Secondary | ICD-10-CM | POA: Insufficient documentation

## 2015-12-20 DIAGNOSIS — Z3483 Encounter for supervision of other normal pregnancy, third trimester: Secondary | ICD-10-CM | POA: Diagnosis not present

## 2015-12-20 DIAGNOSIS — O321XX0 Maternal care for breech presentation, not applicable or unspecified: Secondary | ICD-10-CM

## 2015-12-20 NOTE — Discharge Instructions (Signed)
Breech Birth WHAT IS A BREECH BIRTH?  A breech birth is when a baby is born with the buttocks or the feet first. Most babies are in a head down (vertex) position when they are born. There are three types of breech babies:   When the baby's buttocks are showing first in the birth canal (vagina) with the legs straight up and the feet at the baby's head (frank breech).   When the baby's buttocks shows first with the legs bent at the knees and the feet down near the buttocks (complete breech).   When one or both of the baby's feet are down below the buttocks (footling breech).  WHAT ARE THE RISKS OF A BREECH BIRTH?  Having a breech birth increases the risk to your baby. A breech birth may cause the following:   Umbilical cord prolapse. This is when the umbilical cord is in front of the baby before or during labor. This can cause the cord to become pinched or compressed. This can reduce the flow of blood and oxygen to the baby.  The baby getting stuck in the birth canal, which can cause injury or, rarely, death.  Injury to the nerves in the shoulder, arm, and hand (brachial plexus injury) when delivered.   Your baby being born too early (prematurely).  An increased need for a cesarean delivery. WHAT INCREASES THE RISK OF HAVING A BREECH BABY?  It is not known what causes your baby to be breech. However, risk factors that may increase your chances of having a breech baby include the following:   The mother having had several babies already.   The mother having twins or more.   The mother having a baby with certain congenital disabilities.  The mother going into labor early.  The mother having problems with her uterus, such as a tumor.  The mother having placenta problems (placenta previa) or too much or not enough fluid surrounding the baby (amniotic fluid). HOW DO I KNOW IF MY BABY IS BREECH?  There are no symptoms for you to know that your baby is breech. When you are close to  your due date, your health care provider can tell if your baby is breech by:  An abdominal or vaginal (pelvic) exam.  An ultrasound. Your health care provider may also be able to tell that your baby is breech if your baby's heartbeat is heard above your belly button.  WHAT CAN BE DONE IF MY BABY IS BREECH?  Your health care provider may try to turn the baby in your uterus. This is a procedure called external cephalic version (ECV). This is done by your health care provider. He or she will place both hands on your abdomen and gently and slowly turn the baby around.  If an ECV is done, it is done toward the end of a healthy pregnancy. The baby may remain in this position or he or she may turn back to the breech position. You and your health care provider will discuss if an ECV is recommended for you and your baby.  HOW WILL I DELIVER MY BABY IF MY BABY IS BREECH?  You and your health care provider will discuss the best way to deliver your baby. If your baby is breech, it is less likely that a vaginal delivery will be recommended due to the risks. Some breech babies may be delivered safely without a cesarean, while in other cases health care providers will recommend a cesarean delivery.    This  information is not intended to replace advice given to you by your health care provider. Make sure you discuss any questions you have with your health care provider.   Document Released: 04/26/2006 Document Revised: 11/24/2014 Document Reviewed: 01/07/2014 Elsevier Interactive Patient Education [employer]  Cesarean Delivery Cesarean delivery is the birth of a baby through a cut (incision) in the abdomen and womb (uterus).  LET [facility] CARE PROVIDER KNOW ABOUT:  All medicines you are taking, including vitamins, herbs, eye drops, creams, and over-the-counter medicines.  Previous problems you or members of your family have had with the use of anesthetics.  Any bleeding or blood clotting  disorders you have.  Family history of blood clots or bleeding disorders.  Any history of deep vein thrombosis (DVT) or pulmonary embolism (PE).  Previous surgeries you have had.  Medical conditions you have.  Any allergies you have.  Complicationsinvolving the pregnancy. RISKS AND COMPLICATIONS  Generally, this is a safe procedure. However, as with any procedure, complications can occur. Possible complications include:  Bleeding.  Infection.  Blood clots.  Injury to surrounding organs.  Problems with anesthesia.  Injury to the baby. BEFORE THE PROCEDURE   You may be given an antacid medicine to drink. This will prevent acid contents in your stomach from going into your lungs if you vomit during the surgery.  You may be given an antibiotic medicine to prevent infection. PROCEDURE   To prevent infection of your incision:  Hair may be removed from your pubic area if it is near your incision.  The skin of your pubic area and lower abdomen will be cleaned with a germ-killing solution (antiseptic).  A tube (Foley catheter) will be placed in your bladder to drain your urine from your bladder into a bag. This keeps your bladder empty during surgery.  An IV tube will be placed in your vein.  You may be given medicine to numb the lower half of your body (regional anesthetic). If you were in labor, you may have already had an epidural in place which can be used in both labor and cesarean delivery. You may possibly be given medicine to make you sleep (general anesthetic) though this is not as common.  Your heart rate and your baby's heart rate will be monitored.  An incision will be made in your abdomen that extends to your uterus. There are 2 basic kinds of incisions:  The horizontal (transverse) incision. Horizontal incisions are from side to side and are used for most routine cesarean deliveries.  The vertical incision. The vertical incision is from the top of the abdomen  to the bottom and is less commonly used. It is often done for women who have a serious complication (extreme prematurity) or under emergency situations.  The horizontal and vertical incisions may both be used at the same time. However, this is very uncommon.  An incision is then made in your uterus to deliver the baby.  Your baby will be delivered.  Your health care provider may place the baby on your chest. It is important to keep the baby warm. Your health care provider will dry off the baby, place the baby directly on your bare skin, and cover the baby with warm, dry blankets.  Both incisions will be closed with absorbable stitches. AFTER THE PROCEDURE   If you were awake during the surgery, you will see your baby right away. If you were asleep, you will see your baby as soon as you are awake.  You may breastfeed your baby after surgery.  You may be able to get up and walk the same day as the surgery. If you need to stay in bed for a period of time, you will receive help to turn, cough, and take deep breaths after surgery. This helps prevent lung problems such as pneumonia.  Do not get out of bed alone the first time after surgery. You will need help getting out of bed until you are able to do this by yourself.  You may be able to shower the day after your cesarean delivery. After the bandage (dressing) is taken off the incision site, a nurse will assist you to shower if you would like help.  You may be directed to take actions to help prevent blood clots in your legs. These may include:  Walking shortly after surgery, with someone assisting you. Moving around after surgery helps to improve blood flow.  Wearing compression stockings or using different types of devices.  Taking medicines to thin your blood (anticoagulants) if you are at high risk for DVT or PE.  Save any blood clots that you pass from your vagina. If you pass a clot while on the toilet, do not flush it. Call for  the nurse. Tell the nurse if you think you are bleeding too much or passing too many clots.  You will be given medicine for pain and nausea as needed. Let your health care providers know if you are hurting. You may also be given an antibiotic to prevent an infection.  Your IV tube will be taken out when you are drinking a reasonable amount of fluids. The Foley catheter is taken out when you are up and walking.  If your blood type is Rh negative and your baby's blood type is Rh positive, you will be given a shot of anti-D immune globulin. This shot prevents you from having Rh problems with a future pregnancy. You should get the shot even if you had your tubes tied (tubal ligation).  If you are allowed to take the baby for a walk, place the baby in the bassinet and push it.   This information is not intended to replace advice given to you by your health care provider. Make sure you discuss any questions you have with your health care provider.   Document Released: 03/05/2005 Document Revised: 11/24/2014 Document Reviewed: 10/31/2011 Elsevier Interactive Patient Education [employer]

## 2015-12-20 NOTE — MAU Note (Signed)
Scheduled for c-section on  12-30-2015;

## 2015-12-20 NOTE — MAU Note (Signed)
C/o ucs since 1300 this afternoon; pt is to have a c-section because baby is breech; denies any SROM orbloody show;

## 2015-12-23 ENCOUNTER — Encounter (HOSPITAL_COMMUNITY): Payer: Self-pay | Admitting: *Deleted

## 2015-12-23 ENCOUNTER — Ambulatory Visit (INDEPENDENT_AMBULATORY_CARE_PROVIDER_SITE_OTHER): Payer: Medicaid Other | Admitting: Family

## 2015-12-23 VITALS — BP 118/78 | HR 80 | Wt 209.0 lb

## 2015-12-23 DIAGNOSIS — O321XX0 Maternal care for breech presentation, not applicable or unspecified: Secondary | ICD-10-CM

## 2015-12-23 DIAGNOSIS — O3663X0 Maternal care for excessive fetal growth, third trimester, not applicable or unspecified: Secondary | ICD-10-CM

## 2015-12-23 DIAGNOSIS — Z3480 Encounter for supervision of other normal pregnancy, unspecified trimester: Secondary | ICD-10-CM

## 2015-12-23 NOTE — Progress Notes (Signed)
   PRENATAL VISIT NOTE  Subjective:  Laura SheffieldRebecca Shiffman is a 20 y.o. G3P1011 at 7222w0d being seen today for ongoing prenatal care.  She is currently monitored for the following issues for this low-risk pregnancy and has Supervision of normal pregnancy, antepartum; Excess weight gain in pregnancy; and Breech presentation, antepartum on her problem list.  Patient reports occasional contractions and decreased fetal movement.  Contractions: Irregular. Vag. Bleeding: None.  Movement: (!) Decreased. Denies leaking of fluid.   The following portions of the patient's history were reviewed and updated as appropriate: allergies, current medications, past family history, past medical history, past social history, past surgical history and problem list. Problem list updated.  Objective:   Vitals:   12/23/15 0952  BP: 118/78  Pulse: 80  Weight: 209 lb (94.8 kg)    Fetal Status:     Movement: (!) Decreased     General:  Alert, oriented and cooperative. Patient is in no acute distress.  Skin: Skin is warm and dry. No rash noted.   Cardiovascular: Normal heart rate noted  Respiratory: Normal respiratory effort, no problems with respiration noted  Abdomen: Soft, gravid, appropriate for gestational age. Pain/Pressure: Present     Pelvic:  Cervical exam deferred        Extremities: Normal range of motion.  Edema: None  Mental Status: Normal mood and affect. Normal behavior. Normal judgment and thought content.   Urinalysis: Urine Protein: Negative Urine Glucose: Negative  Assessment and Plan:  Pregnancy: G3P1011 at 2345w0d  1. Excessive fetal growth affecting management of pregnancy in third trimester, single or unspecified fetus - US MFM OB FOLLOW UP; Future  2. Breech presentation with antenatal problem, single or unspecified fetus - Message routed to [place] to schedule CSection on 12/30/15  Term labor symptoms and general obstetric precautions including but not limited to vaginal bleeding,  contractions, leaking of fluid and fetal movement were reviewed in detail with the patient. Please refer to After Visit Summary for other counseling recommendations.   Eino FarberWalidah Kennith GainN Karim, CNM

## 2015-12-26 ENCOUNTER — Ambulatory Visit (INDEPENDENT_AMBULATORY_CARE_PROVIDER_SITE_OTHER): Payer: Medicaid Other | Admitting: Certified Nurse Midwife

## 2015-12-26 ENCOUNTER — Other Ambulatory Visit: Payer: Self-pay | Admitting: Obstetrics & Gynecology

## 2015-12-26 ENCOUNTER — Encounter (HOSPITAL_COMMUNITY): Payer: Self-pay

## 2015-12-26 VITALS — BP 117/77 | HR 83 | Wt 210.0 lb

## 2015-12-26 DIAGNOSIS — O471 False labor at or after 37 completed weeks of gestation: Secondary | ICD-10-CM

## 2015-12-26 DIAGNOSIS — Z3483 Encounter for supervision of other normal pregnancy, third trimester: Secondary | ICD-10-CM | POA: Diagnosis not present

## 2015-12-26 DIAGNOSIS — Z3480 Encounter for supervision of other normal pregnancy, unspecified trimester: Secondary | ICD-10-CM

## 2015-12-26 NOTE — Progress Notes (Signed)
Subjective:  Laura SheffieldRebecca [name] is a 20 y.o. G3P1011 at 389w3d being seen today for labor check.  She is currently monitored for the following issues for this low-risk pregnancy and has Supervision of normal pregnancy, antepartum; Excess weight gain in pregnancy; and Breech presentation, antepartum on her problem list.  Patient reports ctx q3 min last night, more irregular this am.  Contractions: Regular. Vag. Bleeding: None.  Movement: Present. Denies leaking of fluid.   The following portions of the patient's history were reviewed and updated as appropriate: allergies, current medications, past family history, past medical history, past social history, past surgical history and problem list. Problem list updated.  Objective:   Vitals:   12/26/15 0908  BP: 117/77  Pulse: 83  Weight: 210 lb (95.3 kg)    Fetal Status: Fetal Heart Rate (bpm): NST-R   Movement: Present  Presentation: Homero FellersFrank Breech toco: irregular, mild  General:  Alert, oriented and cooperative. Patient is in no acute distress.  Skin: Skin is warm and dry. No rash noted.   Cardiovascular: Normal heart rate noted  Respiratory: Normal respiratory effort, no problems with respiration noted  Abdomen: Soft, gravid, appropriate for gestational age. Pain/Pressure: Present     Pelvic: Vag. Bleeding: None Vag D/C Character: Thin   Cervical exam performed Dilation: Fingertip Effacement (%): 60 Station: -3  Extremities: Normal range of motion.  Edema: None  Mental Status: Normal mood and affect. Normal behavior. Normal judgment and thought content.   Urinalysis: Urine Protein: Negative Urine Glucose: Negative  Assessment and Plan:  Pregnancy: G3P1011 at 6789w3d 1. Supervision of other normal pregnancy 2. False labor 3. Breech presentation -CS to be scheduled   Term labor symptoms and general obstetric precautions including but not limited to vaginal bleeding, contractions, leaking of fluid and fetal movement were reviewed in  detail with the patient. Please refer to After Visit Summary for other counseling recommendations.  Return in about 6 weeks (around 02/06/2016).   Donette LarryMelanie Caree Wolpert, CNM

## 2015-12-29 ENCOUNTER — Encounter (HOSPITAL_COMMUNITY)
Admission: RE | Admit: 2015-12-29 | Discharge: 2015-12-29 | Disposition: A | Discharge status: Home / Self Care | Payer: Medicaid Other | Source: Ambulatory Visit | Attending: Obstetrics & Gynecology | Admitting: Obstetrics & Gynecology

## 2015-12-29 LAB — CBC
HCT: 33.1 % — ABNORMAL LOW (ref 36.0–46.0)
HEMOGLOBIN: 11 g/dL — AB (ref 12.0–15.0)
MCH: 27.5 pg (ref 26.0–34.0)
MCHC: 33.2 g/dL (ref 30.0–36.0)
MCV: 82.8 fL (ref 78.0–100.0)
Platelets: 221 10*3/uL (ref 150–400)
RBC: 4 MIL/uL (ref 3.87–5.11)
RDW: 13.8 % (ref 11.5–15.5)
WBC: 7.9 10*3/uL (ref 4.0–10.5)

## 2015-12-29 LAB — TYPE AND SCREEN
ABO/RH(D): A POS
Antibody Screen: NEGATIVE

## 2015-12-29 NOTE — Patient Instructions (Signed)
18 Laura SheffieldRebecca [name]  12/29/2015   Your procedure is scheduled on:  12/30/2015  [facility].  Pick up the phone at the desk and dial [phone].   Call this number if you have problems the morning of surgery: [phone]   Remember:   Do not eat food:After Midnight.  Do not drink clear liquids: After Midnight.  Take these medicines the morning of surgery with A SIP OF WATER: na   Do not wear jewelry, make-up or nail polish.  Do not wear lotions, powders, or perfumes. Do not wear deodorant.  Do not shave 48 hours prior to surgery.  Do not bring valuables to the hospital.  [facility] is not   responsible for any belongings or valuables brought to the hospital.  Contacts, dentures or bridgework may not be worn into surgery.  Leave suitcase in the car. After surgery it may be brought to your room.  For patients admitted to the hospital, checkout time is 11:00 AM the day of              discharge.   Patients discharged the day of surgery will not be allowed to drive             home.  Name and phone number of your driver: na  Special Instructions:   N/A   Please read over the following fact sheets that you were given:   Surgical Site Infection Prevention

## 2015-12-30 ENCOUNTER — Inpatient Hospital Stay (HOSPITAL_COMMUNITY): Payer: Medicaid Other | Admitting: Anesthesiology

## 2015-12-30 ENCOUNTER — Inpatient Hospital Stay (HOSPITAL_COMMUNITY)
Admission: RE | Admit: 2015-12-30 | Discharge: 2016-01-01 | DRG: 766 | Disposition: A | Discharge status: Home / Self Care | Payer: Medicaid Other | Source: Ambulatory Visit | Attending: Obstetrics & Gynecology | Admitting: Obstetrics & Gynecology

## 2015-12-30 ENCOUNTER — Encounter (HOSPITAL_COMMUNITY): Payer: Self-pay

## 2015-12-30 ENCOUNTER — Encounter (HOSPITAL_COMMUNITY)
Admission: RE | Disposition: A | Discharge status: Home / Self Care | Payer: Self-pay | Source: Ambulatory Visit | Attending: Obstetrics & Gynecology

## 2015-12-30 DIAGNOSIS — Z3A39 39 weeks gestation of pregnancy: Secondary | ICD-10-CM

## 2015-12-30 DIAGNOSIS — Z833 Family history of diabetes mellitus: Secondary | ICD-10-CM

## 2015-12-30 DIAGNOSIS — Z880 Allergy status to penicillin: Secondary | ICD-10-CM | POA: Diagnosis not present

## 2015-12-30 DIAGNOSIS — O321XX0 Maternal care for breech presentation, not applicable or unspecified: Principal | ICD-10-CM | POA: Diagnosis present

## 2015-12-30 DIAGNOSIS — Z98891 History of uterine scar from previous surgery: Secondary | ICD-10-CM

## 2015-12-30 DIAGNOSIS — Z87891 Personal history of nicotine dependence: Secondary | ICD-10-CM

## 2015-12-30 LAB — RPR: RPR: NONREACTIVE

## 2015-12-30 SURGERY — Surgical Case
Anesthesia: Spinal | Site: Abdomen

## 2015-12-30 MED ORDER — LACTATED RINGERS IV SOLN
INTRAVENOUS | Status: DC
  Administered 2015-12-30: 13:00:00 via INTRAVENOUS

## 2015-12-30 MED ORDER — LACTATED RINGERS IV SOLN
INTRAVENOUS | Status: DC
  Administered 2015-12-30: 23:00:00 via INTRAVENOUS

## 2015-12-30 MED ORDER — SIMETHICONE 80 MG PO CHEW
80.0000 mg | CHEWABLE_TABLET | ORAL | Status: DC
  Administered 2015-12-30 – 2016-01-01 (×2): 80 mg via ORAL
  Filled 2015-12-30 (×3): qty 1

## 2015-12-30 MED ORDER — OXYTOCIN 40 UNITS IN LACTATED RINGERS INFUSION - SIMPLE MED: 2.5000 [IU]/h | INTRAVENOUS | Status: AC

## 2015-12-30 MED ORDER — FENTANYL CITRATE (PF) 100 MCG/2ML IJ SOLN
INTRAMUSCULAR | Status: AC
  Filled 2015-12-30: qty 2

## 2015-12-30 MED ORDER — BUPIVACAINE HCL (PF) 0.5 % IJ SOLN
INTRAMUSCULAR | Status: DC | PRN
  Administered 2015-12-30: 10 mL

## 2015-12-30 MED ORDER — ACETAMINOPHEN 325 MG PO TABS
650.0000 mg | ORAL_TABLET | ORAL | Status: DC | PRN
  Filled 2015-12-30: qty 2

## 2015-12-30 MED ORDER — BUPIVACAINE HCL (PF) 0.5 % IJ SOLN
INTRAMUSCULAR | Status: AC
  Filled 2015-12-30: qty 30

## 2015-12-30 MED ORDER — DIPHENHYDRAMINE HCL 25 MG PO CAPS
25.0000 mg | ORAL_CAPSULE | ORAL | Status: DC | PRN
  Administered 2015-12-30: 25 mg via ORAL
  Filled 2015-12-30: qty 1

## 2015-12-30 MED ORDER — KETOROLAC TROMETHAMINE 30 MG/ML IJ SOLN: 30.0000 mg | Freq: Once | INTRAMUSCULAR | Status: DC

## 2015-12-30 MED ORDER — SODIUM CHLORIDE 0.9 % IR SOLN
Status: DC | PRN
Start: 2015-12-30 — End: 2015-12-30
  Administered 2015-12-30: 1

## 2015-12-30 MED ORDER — KETOROLAC TROMETHAMINE 30 MG/ML IJ SOLN
30.0000 mg | Freq: Four times a day (QID) | INTRAMUSCULAR | Status: DC | PRN
  Administered 2015-12-30: 30 mg via INTRAMUSCULAR

## 2015-12-30 MED ORDER — DIPHENHYDRAMINE HCL 50 MG/ML IJ SOLN: 12.5000 mg | INTRAMUSCULAR | Status: DC | PRN

## 2015-12-30 MED ORDER — SCOPOLAMINE 1 MG/3DAYS TD PT72
MEDICATED_PATCH | TRANSDERMAL | Status: AC
  Administered 2015-12-30: 1.5 mg via TRANSDERMAL
  Filled 2015-12-30: qty 1

## 2015-12-30 MED ORDER — PROMETHAZINE HCL 25 MG/ML IJ SOLN: 6.2500 mg | INTRAMUSCULAR | Status: DC | PRN

## 2015-12-30 MED ORDER — BUPIVACAINE IN DEXTROSE 0.75-8.25 % IT SOLN
INTRATHECAL | Status: DC | PRN
  Administered 2015-12-30: 1.4 mL via INTRATHECAL

## 2015-12-30 MED ORDER — ERYTHROMYCIN 5 MG/GM OP OINT
TOPICAL_OINTMENT | OPHTHALMIC | Status: AC
  Filled 2015-12-30: qty 1

## 2015-12-30 MED ORDER — OXYCODONE HCL 5 MG PO TABS
5.0000 mg | ORAL_TABLET | ORAL | Status: DC | PRN
  Administered 2015-12-31 – 2016-01-01 (×5): 5 mg via ORAL
  Filled 2015-12-30 (×5): qty 1

## 2015-12-30 MED ORDER — ONDANSETRON HCL 4 MG/2ML IJ SOLN
INTRAMUSCULAR | Status: AC
  Filled 2015-12-30: qty 2

## 2015-12-30 MED ORDER — DIBUCAINE 1 % RE OINT: 1.0000 "application " | TOPICAL_OINTMENT | RECTAL | Status: DC | PRN

## 2015-12-30 MED ORDER — PHENYLEPHRINE 8 MG IN D5W 100 ML (0.08MG/ML) PREMIX OPTIME
INJECTION | INTRAVENOUS | Status: AC
  Filled 2015-12-30: qty 100

## 2015-12-30 MED ORDER — CEFAZOLIN SODIUM-DEXTROSE 2-4 GM/100ML-% IV SOLN
2.0000 g | INTRAVENOUS | Status: AC
  Administered 2015-12-30: 2 g via INTRAVENOUS

## 2015-12-30 MED ORDER — SCOPOLAMINE 1 MG/3DAYS TD PT72: 1.0000 | MEDICATED_PATCH | Freq: Once | TRANSDERMAL | Status: DC

## 2015-12-30 MED ORDER — ACETAMINOPHEN 500 MG PO TABS
1000.0000 mg | ORAL_TABLET | Freq: Four times a day (QID) | ORAL | Status: AC
  Administered 2015-12-30 – 2015-12-31 (×4): 1000 mg via ORAL
  Filled 2015-12-30 (×4): qty 2

## 2015-12-30 MED ORDER — KETOROLAC TROMETHAMINE 30 MG/ML IJ SOLN: 30.0000 mg | Freq: Four times a day (QID) | INTRAMUSCULAR | Status: DC | PRN

## 2015-12-30 MED ORDER — LACTATED RINGERS IV SOLN
Freq: Once | INTRAVENOUS | Status: AC
  Administered 2015-12-30: 12:00:00 via INTRAVENOUS

## 2015-12-30 MED ORDER — IBUPROFEN 600 MG PO TABS
600.0000 mg | ORAL_TABLET | Freq: Four times a day (QID) | ORAL | Status: DC
  Administered 2015-12-30 – 2016-01-01 (×7): 600 mg via ORAL
  Filled 2015-12-30 (×7): qty 1

## 2015-12-30 MED ORDER — ONDANSETRON HCL 4 MG/2ML IJ SOLN
INTRAMUSCULAR | Status: DC | PRN
  Administered 2015-12-30: 4 mg via INTRAVENOUS

## 2015-12-30 MED ORDER — KETOROLAC TROMETHAMINE 30 MG/ML IJ SOLN
INTRAMUSCULAR | Status: AC
  Filled 2015-12-30: qty 1

## 2015-12-30 MED ORDER — NALBUPHINE HCL 10 MG/ML IJ SOLN: 5.0000 mg | Freq: Once | INTRAMUSCULAR | Status: DC | PRN

## 2015-12-30 MED ORDER — MENTHOL 3 MG MT LOZG: 1.0000 | LOZENGE | OROMUCOSAL | Status: DC | PRN

## 2015-12-30 MED ORDER — SENNOSIDES-DOCUSATE SODIUM 8.6-50 MG PO TABS
2.0000 | ORAL_TABLET | ORAL | Status: DC
  Administered 2015-12-30 – 2016-01-01 (×2): 2 via ORAL
  Filled 2015-12-30 (×2): qty 2

## 2015-12-30 MED ORDER — NALBUPHINE HCL 10 MG/ML IJ SOLN: 5.0000 mg | INTRAMUSCULAR | Status: DC | PRN

## 2015-12-30 MED ORDER — PRENATAL MULTIVITAMIN CH
1.0000 | ORAL_TABLET | Freq: Every day | ORAL | Status: DC
  Administered 2015-12-31 – 2016-01-01 (×2): 1 via ORAL
  Filled 2015-12-30 (×2): qty 1

## 2015-12-30 MED ORDER — ONDANSETRON HCL 4 MG/2ML IJ SOLN: 4.0000 mg | Freq: Three times a day (TID) | INTRAMUSCULAR | Status: DC | PRN

## 2015-12-30 MED ORDER — MORPHINE SULFATE (PF) 0.5 MG/ML IJ SOLN
INTRAMUSCULAR | Status: DC | PRN
  Administered 2015-12-30: .2 mg via INTRATHECAL

## 2015-12-30 MED ORDER — HYDROMORPHONE HCL 1 MG/ML IJ SOLN: 0.2500 mg | INTRAMUSCULAR | Status: DC | PRN

## 2015-12-30 MED ORDER — SCOPOLAMINE 1 MG/3DAYS TD PT72
1.0000 | MEDICATED_PATCH | Freq: Once | TRANSDERMAL | Status: DC
  Administered 2015-12-30: 1.5 mg via TRANSDERMAL

## 2015-12-30 MED ORDER — ZOLPIDEM TARTRATE 5 MG PO TABS: 5.0000 mg | ORAL_TABLET | Freq: Every evening | ORAL | Status: DC | PRN

## 2015-12-30 MED ORDER — NALBUPHINE HCL 10 MG/ML IJ SOLN
5.0000 mg | INTRAMUSCULAR | Status: DC | PRN
  Administered 2015-12-30: 5 mg via SUBCUTANEOUS
  Filled 2015-12-30: qty 1

## 2015-12-30 MED ORDER — SODIUM CHLORIDE 0.9% FLUSH: 3.0000 mL | INTRAVENOUS | Status: DC | PRN

## 2015-12-30 MED ORDER — OXYTOCIN 10 UNIT/ML IJ SOLN
INTRAVENOUS | Status: DC | PRN
  Administered 2015-12-30: 40 [IU] via INTRAVENOUS

## 2015-12-30 MED ORDER — MORPHINE SULFATE-NACL 0.5-0.9 MG/ML-% IV SOSY
PREFILLED_SYRINGE | INTRAVENOUS | Status: AC
  Filled 2015-12-30: qty 1

## 2015-12-30 MED ORDER — DIPHENHYDRAMINE HCL 25 MG PO CAPS
25.0000 mg | ORAL_CAPSULE | Freq: Four times a day (QID) | ORAL | Status: DC | PRN
  Administered 2015-12-31: 25 mg via ORAL
  Filled 2015-12-30: qty 1

## 2015-12-30 MED ORDER — WITCH HAZEL-GLYCERIN EX PADS: 1.0000 "application " | MEDICATED_PAD | CUTANEOUS | Status: DC | PRN

## 2015-12-30 MED ORDER — NALOXONE HCL 2 MG/2ML IJ SOSY
1.0000 ug/kg/h | PREFILLED_SYRINGE | INTRAVENOUS | Status: DC | PRN
  Filled 2015-12-30: qty 2

## 2015-12-30 MED ORDER — SIMETHICONE 80 MG PO CHEW: 80.0000 mg | CHEWABLE_TABLET | ORAL | Status: DC | PRN

## 2015-12-30 MED ORDER — OXYTOCIN 10 UNIT/ML IJ SOLN
INTRAMUSCULAR | Status: AC
  Filled 2015-12-30: qty 4

## 2015-12-30 MED ORDER — IBUPROFEN 600 MG PO TABS
600.0000 mg | ORAL_TABLET | Freq: Four times a day (QID) | ORAL | Status: DC | PRN
  Administered 2015-12-31: 600 mg via ORAL

## 2015-12-30 MED ORDER — COCONUT OIL OIL
1.0000 "application " | TOPICAL_OIL | Status: DC | PRN
  Administered 2016-01-01: 1 via TOPICAL
  Filled 2015-12-30: qty 120

## 2015-12-30 MED ORDER — OXYCODONE HCL 5 MG PO TABS: 10.0000 mg | ORAL_TABLET | ORAL | Status: DC | PRN

## 2015-12-30 MED ORDER — SIMETHICONE 80 MG PO CHEW
80.0000 mg | CHEWABLE_TABLET | Freq: Three times a day (TID) | ORAL | Status: DC
  Administered 2015-12-30 – 2016-01-01 (×6): 80 mg via ORAL
  Filled 2015-12-30 (×5): qty 1

## 2015-12-30 MED ORDER — NALOXONE HCL 0.4 MG/ML IJ SOLN: 0.4000 mg | INTRAMUSCULAR | Status: DC | PRN

## 2015-12-30 MED ORDER — TETANUS-DIPHTH-ACELL PERTUSSIS 5-2.5-18.5 LF-MCG/0.5 IM SUSP: 0.5000 mL | Freq: Once | INTRAMUSCULAR | Status: DC

## 2015-12-30 MED ORDER — PHENYLEPHRINE 8 MG IN D5W 100 ML (0.08MG/ML) PREMIX OPTIME
INJECTION | INTRAVENOUS | Status: DC | PRN
  Administered 2015-12-30: 60 ug/min via INTRAVENOUS

## 2015-12-30 MED ORDER — LACTATED RINGERS IV SOLN
INTRAVENOUS | Status: DC
  Administered 2015-12-30 (×2): via INTRAVENOUS

## 2015-12-30 MED ORDER — FENTANYL CITRATE (PF) 100 MCG/2ML IJ SOLN
INTRAMUSCULAR | Status: DC | PRN
  Administered 2015-12-30: 20 ug via INTRATHECAL

## 2015-12-30 SURGICAL SUPPLY — 32 items
BARRIER ADHS 3X4 INTERCEED (GAUZE/BANDAGES/DRESSINGS) IMPLANT
BENZOIN TINCTURE PRP APPL 2/3 (GAUZE/BANDAGES/DRESSINGS) ×3 IMPLANT
CHLORAPREP W/TINT 26ML (MISCELLANEOUS) ×3 IMPLANT
CLAMP CORD UMBIL (MISCELLANEOUS) IMPLANT
CLOSURE STERI STRIP 1/2 X4 (GAUZE/BANDAGES/DRESSINGS) ×3 IMPLANT
CLOTH BEACON ORANGE TIMEOUT ST (SAFETY) ×3 IMPLANT
DRSG OPSITE POSTOP 4X10 (GAUZE/BANDAGES/DRESSINGS) ×3 IMPLANT
ELECT REM PT RETURN 9FT ADLT (ELECTROSURGICAL) ×3
ELECTRODE REM PT RTRN 9FT ADLT (ELECTROSURGICAL) ×1 IMPLANT
EXTRACTOR VACUUM KIWI (MISCELLANEOUS) IMPLANT
GLOVE BIO SURGEON STRL SZ 6.5 (GLOVE) ×2 IMPLANT
GLOVE BIO SURGEONS STRL SZ 6.5 (GLOVE) ×1
GLOVE BIOGEL PI IND STRL 7.0 (GLOVE) ×2 IMPLANT
GLOVE BIOGEL PI INDICATOR 7.0 (GLOVE) ×4
GOWN STRL REUS W/TWL LRG LVL3 (GOWN DISPOSABLE) ×6 IMPLANT
KIT ABG SYR 3ML LUER SLIP (SYRINGE) IMPLANT
NEEDLE HYPO 22GX1.5 SAFETY (NEEDLE) IMPLANT
NEEDLE HYPO 25X5/8 SAFETYGLIDE (NEEDLE) IMPLANT
NS IRRIG 1000ML POUR BTL (IV SOLUTION) ×3 IMPLANT
PACK C SECTION WH (CUSTOM PROCEDURE TRAY) ×3 IMPLANT
PAD ABD 7.5X8 STRL (GAUZE/BANDAGES/DRESSINGS) ×3 IMPLANT
PAD OB MATERNITY 4.3X12.25 (PERSONAL CARE ITEMS) ×3 IMPLANT
PENCIL SMOKE EVAC W/HOLSTER (ELECTROSURGICAL) ×3 IMPLANT
RETRACTOR WND ALEXIS 25 LRG (MISCELLANEOUS) IMPLANT
RTRCTR WOUND ALEXIS 25CM LRG (MISCELLANEOUS)
SUT VIC AB 0 CT1 36 (SUTURE) ×18 IMPLANT
SUT VIC AB 2-0 CT1 27 (SUTURE) ×2
SUT VIC AB 2-0 CT1 TAPERPNT 27 (SUTURE) ×1 IMPLANT
SUT VIC AB 4-0 PS2 27 (SUTURE) ×3 IMPLANT
SYR CONTROL 10ML LL (SYRINGE) IMPLANT
TOWEL OR 17X24 6PK STRL BLUE (TOWEL DISPOSABLE) ×3 IMPLANT
TRAY FOLEY CATH SILVER 14FR (SET/KITS/TRAYS/PACK) IMPLANT

## 2015-12-30 NOTE — Transfer of Care (Signed)
Immediate Anesthesia Transfer of Care Note  Patient: Purvis SheffieldRebecca Ridling  Procedure(s) Performed: Procedure(s): CESAREAN SECTION (N/A)  Patient Location: PACU  Anesthesia Type:Spinal  Level of Consciousness: awake, alert  and oriented  Airway & Oxygen Therapy: Patient Spontanous Breathing  Post-op Assessment: Report given to RN and Post -op Vital signs reviewed and stable  Post vital signs: Reviewed and stable  Last Vitals:  Vitals:   12/30/15 1345 12/30/15 1400  BP: 107/66 103/72  Pulse: 60 67  Resp: 18 19  Temp:      Last Pain:  Vitals:   12/30/15 1400  TempSrc:   PainSc: 0-No pain      Patients Stated Pain Goal: 4 (12/30/15 1057)  Complications: No apparent anesthesia complications

## 2015-12-30 NOTE — Anesthesia Postprocedure Evaluation (Signed)
Anesthesia Post Note  Patient: Laura [name]  Procedure(s) Performed: Procedure(s) (LRB): CESAREAN SECTION (N/A)  Patient location during evaluation: Mother Baby Anesthesia Type: Spinal Level of consciousness: awake Pain management: pain level controlled Vital Signs Assessment: post-procedure vital signs reviewed and stable Respiratory status: spontaneous breathing Cardiovascular status: stable Postop Assessment: no headache, no backache, spinal receding, patient able to bend at knees, no signs of nausea or vomiting and adequate PO intake Anesthetic complications: no     Last Vitals:  Vitals:   12/30/15 1451 12/30/15 1600  BP: 100/65 106/63  Pulse: (!) 59 90  Resp: 18 18  Temp: 36.5 C 36.4 C    Last Pain:  Vitals:   12/30/15 1600  TempSrc: Oral  PainSc:    Pain Goal: Patients Stated Pain Goal: 4 (12/30/15 1057)               Jazzlin Clements

## 2015-12-30 NOTE — Consult Note (Signed)
Neonatology Note:   Attendance at C-section:    I was asked by Dr. Arnold to attend this  C/S at term for breech. The mother is a G3P1010, GBS not known with good prenatal care. ROM at delivery, fluid clear. Infant vigorous with good spontaneous cry and tone. Needed only minimal bulb suctioning. Ap 8/9. Lungs clear to ausc in DR. To CN to care of Pediatrician.  David C. Ehrmann, MD 

## 2015-12-30 NOTE — Lactation Note (Signed)
This note was copied from a baby's chart. Lactation Consultation Note  Patient Name: Boy Purvis SheffieldRebecca Rickles GNFAO'ZToday's Date: 12/30/2015 Reason for consult: Initial assessment Baby at 3 hr of life. Mom bf her older child 3 wk until the pain was too much. She tried pumping but "only lasted a 1 wk". She denies breast or nipple pain, voiced no concerns. She has a Engineer, waterprivate lactation consultant coming to the hospital at 1730. Discussed baby behavior, feeding frequency, baby belly size, voids, wt loss, breast changes, and nipple care. She stated she can manually express and has spoon in room. Given lactation handouts. Aware of OP services and support group. She will call as needed.     Maternal Data Has patient been taught Hand Expression?: Yes Does the patient have breastfeeding experience prior to this delivery?: Yes  Feeding Feeding Type: Breast Fed Length of feed: 3 min  LATCH Score/Interventions Latch: Grasps breast easily, tongue down, lips flanged, rhythmical sucking.  Audible Swallowing: A few with stimulation Intervention(s): Skin to skin  Type of Nipple: Everted at rest and after stimulation  Comfort (Breast/Nipple): Soft / non-tender     Hold (Positioning): Assistance needed to correctly position infant at breast and maintain latch.  LATCH Score: 8  Lactation Tools Discussed/Used WIC Program: No   Consult Status Consult Status: Follow-up Date: 12/31/15 Follow-up type: In-patient    Rulon Eisenmengerlizabeth E Kelin Borum 12/30/2015, 4:06 PM

## 2015-12-30 NOTE — H&P (Signed)
Laura [name] is a 20 y.o. female presenting for cesarean section for breech presentation. OB History    Gravida Para Term Preterm AB Living   3 1 1   1 1    SAB TAB Ectopic Multiple Live Births   1             Past Medical History:  Diagnosis Date  . Medical history non-contributory    Past Surgical History:  Procedure Laterality Date  . APPENDECTOMY    . CHOLECYSTECTOMY     Family History: family history includes Cancer in her maternal aunt, maternal grandfather, maternal uncle, and paternal grandmother; Diabetes in her maternal grandmother, paternal aunt, paternal grandfather, paternal grandmother, and paternal uncle. Social History:  reports that she has quit smoking. She has quit using smokeless tobacco. She reports that she does not drink alcohol or use drugs.  Clinic  [facility] Prenatal Labs  Dating  LMP consistent with first trimester [place] Blood type: A/POS/-- (03/10 0854)   Genetic Screen 1 Screen:NML NT nml   AFP:  neg    Antibody:NEG (03/10 0854)  Anatomic [place] nml @ 18 wks, female Rubella: 4.62 (03/10 0854)  GTT Early:               Third trimester: 74 RPR: NON REAC (03/10 0854)   Flu vaccine  05/27/15.  12/09/15[ ]  HBsAg: NEGATIVE (03/10 0854)   TDaP vaccine  10/17/15                                 HIV: NONREACTIVE (03/10 0854)   Baby Food   Breast                                           GBS: (For PCN allergy, check sensitivities)neg  Contraception  nexplanon Pap:  Negative  Circumcision  Outpatient circ   Pediatrician  [facility]   Support Person  Zac (FOB)      Maternal Diabetes: No Genetic Screening: Normal Maternal Ultrasounds/Referrals: Normal Fetal Ultrasounds or other Referrals:  None Maternal Substance Abuse:  No Significant Maternal Medications:  None Significant Maternal Lab Results:  None Other Comments:  None  ROS Maternal Medical History:  Reason for admission: Elective CS for Breech  Fetal activity: Perceived fetal activity is  normal.    Prenatal complications: no prenatal complications Prenatal Complications - Diabetes: none.      Blood pressure 116/84, pulse 76, temperature 98 F (36.7 C), temperature source Oral, resp. rate 16, last menstrual period 04/01/2015, SpO2 99 %, unknown if currently breastfeeding. Maternal Exam:  Abdomen: Patient reports no abdominal tenderness. Fetal presentation: breech  Introitus: not evaluated.   Cervix: not evaluated.   Physical Exam  Vitals reviewed. Constitutional: She is oriented to person, place, and time. She appears well-developed. No distress.  HENT:  Head: Normocephalic.  Neck: Normal range of motion.  Cardiovascular: Normal rate.   Respiratory: Effort normal. No respiratory distress.  Neurological: She is alert and oriented to person, place, and time.  Psychiatric: She has a normal mood and affect. Her behavior is normal.    Prenatal labs: ABO, Rh: --/--/A POS (10/12 1034) Antibody: NEG (10/12 1034) Rubella: 4.62 (03/10 0854) RPR: Non Reactive (10/12 1030)  HBsAg: NEGATIVE (03/10 0854)  HIV: NONREACTIVE (07/31 1041)  GBS:     Assessment/Plan: [redacted]w[redacted]d G3P1011 Breech  confirmed by US today, s/p failed ECV attempt. Patient desires surgical management with C/S.  The risks of surgery were discussed in detail with the patient including but not limited to: bleeding which may require transfusion or reoperation; infection which may require prolonged hospitalization or re-hospitalization and antibiotic therapy; injury to bowel, bladder, ureters and major vessels or other surrounding organs; need for additional procedures including laparotomy; thromboembolic phenomenon, incisional problems and other postoperative or anesthesia complications.  Patient was told that the likelihood that her condition and symptoms will be treated effectively with this surgical management was very high; the postoperative expectations were also discussed in detail. The patient also  understands the alternative treatment options which were discussed in full. All questions were answered.    Laura [name] 12/30/2015, 12:00 PM

## 2015-12-30 NOTE — Anesthesia Procedure Notes (Signed)
Spinal  Patient location during procedure: OR Start time: 12/30/2015 12:28 PM End time: 12/30/2015 12:30 PM Staffing Anesthesiologist: Leilani AbleHATCHETT, Tavoris Brisk Performed: anesthesiologist  Preanesthetic Checklist Completed: patient identified, surgical consent, pre-op evaluation, timeout performed, IV checked, risks and benefits discussed and monitors and equipment checked Spinal Block Patient position: sitting Prep: site prepped and draped and DuraPrep Patient monitoring: heart rate, cardiac monitor, continuous pulse ox and blood pressure Approach: midline Location: L3-4 Injection technique: single-shot Needle Needle type: Sprotte  Needle gauge: 24 G Needle length: 9 cm Needle insertion depth: 6 cm Assessment Sensory level: T6

## 2015-12-30 NOTE — Anesthesia Postprocedure Evaluation (Signed)
Anesthesia Post Note  Patient: Laura SheffieldRebecca Hernandes  Procedure(s) Performed: Procedure(s) (LRB): CESAREAN SECTION (N/A)  Patient location during evaluation: PACU Anesthesia Type: Spinal Level of consciousness: awake Pain management: pain level controlled Vital Signs Assessment: post-procedure vital signs reviewed and stable Respiratory status: spontaneous breathing Cardiovascular status: stable Postop Assessment: no headache, no backache, spinal receding, patient able to bend at knees and no signs of nausea or vomiting Anesthetic complications: no     Last Vitals:  Vitals:   12/30/15 1345 12/30/15 1400  BP: 107/66 103/72  Pulse: 60 67  Resp: 18 19  Temp:      Last Pain:  Vitals:   12/30/15 1400  TempSrc:   PainSc: 0-No pain   Pain Goal: Patients Stated Pain Goal: 4 (12/30/15 1057)               Ronin Rehfeldt JR,JOHN Calissa Swenor

## 2015-12-30 NOTE — Op Note (Signed)
Cesarean Section Operative Report  Laura SheffieldRebecca [name]  12/30/2015  Indications: Breech Presentation   Pre-operative Diagnosis:  Breech.   Post-operative Diagnosis: Same   Surgeon: Surgeon(s) and Role:    * Adam PhenixJames G Arnold, MD - Primary    * Lorne SkeensNicholas Michael Schenk, MD - Fellow   Assistants: none  Anesthesia: spinal    Estimated Blood Loss: 750 ml   Specimens: none  Findings: Viable female infant in breech presentation; Apgars 8 and 9; clear amniotic fluid; intact placenta with three vessel cord; normal uterus, fallopian tubes and ovaries bilaterally.  Baby condition / location:  Couplet care / Skin to Skin   Complications: no complications  Indications: Laura SheffieldRebecca [name] is a 20 y.o. G3P2011 with an IUP 2885w0d presenting for PLTCS for breech presentation failed version.  Procedure Details:  The patient was taken back to the operative suite where spinal anesthesia was placed.  A time out was held and the above information confirmed.   After induction of anesthesia, the patient was draped and prepped in the usual sterile manner and placed in a dorsal supine position with a leftward tilt. A Pfannenstiel incision was made and carried down through the subcutaneous tissue to the fascia. Fascial incision was made and sharply extended transversely. The fascia was separated from the underlying rectus tissue superiorly and inferiorly. The peritoneum was identified and bluntly entered and extended longitudinally. Alexis retractor was placed. A low transverse uterine incision was made and extended bluntly. Delivered from breech presentation was a viable infant with Apgars as above.  After waiting 60 seconds for delayed cord cutting, the umbilical cord was clamped and cut cord blood was obtained for evaluation. The placenta was removed Intact and appeared normal. The uterine outline, tubes and ovaries appeared normal. The uterine incision was closed with running locked sutures of 0Vicryl  with an imbricating layer of the same.   Hemostasis was observed. The peritoneum was closed with 3-0 vicryl. The rectus muscles were examined and hemostasis observed. The fascia was then reapproximated with running sutures of 0Vicryl. A total of 10 ml 0.5% Marcaine was injected subcutaneously at the margins of the incision. TThe skin was closed with 4-0Vicryl.   Instrument, sponge, and needle counts were correct prior the abdominal closure and were correct at the conclusion of the case.     Disposition: PACU - hemodynamically stable.       SignedLes Pou: Nicholas SchenkMD 12/30/2015 2:20 PM

## 2015-12-30 NOTE — Anesthesia Preprocedure Evaluation (Signed)
Anesthesia Evaluation  Patient identified by MRN, date of birth, ID band Patient awake    Reviewed: Allergy & Precautions, H&P , NPO status , Patient's Chart, lab work & pertinent test results  Airway Mallampati: I  TM Distance: >3 FB Neck ROM: full    Dental no notable dental hx.    Pulmonary former smoker,    Pulmonary exam normal        Cardiovascular negative cardio ROS Normal cardiovascular exam     Neuro/Psych negative neurological ROS  negative psych ROS   GI/Hepatic negative GI ROS, Neg liver ROS,   Endo/Other  negative endocrine ROS  Renal/GU negative Renal ROS     Musculoskeletal   Abdominal (+) + obese,   Peds  Hematology negative hematology ROS (+)   Anesthesia Other Findings   Reproductive/Obstetrics (+) Pregnancy                             Anesthesia Physical Anesthesia Plan  ASA: II  Anesthesia Plan: Spinal   Post-op Pain Management:    Induction:   Airway Management Planned:   Additional Equipment:   Intra-op Plan:   Post-operative Plan:   Informed Consent: I have reviewed the patients History and Physical, chart, labs and discussed the procedure including the risks, benefits and alternatives for the proposed anesthesia with the patient or authorized representative who has indicated his/her understanding and acceptance.     Plan Discussed with: CRNA and Surgeon  Anesthesia Plan Comments:         Anesthesia Quick Evaluation  

## 2015-12-30 NOTE — Addendum Note (Signed)
Addendum  created 12/30/15 1747 by Renford DillsJanet L Tova Vater, CRNA   Sign clinical note

## 2015-12-31 DIAGNOSIS — O321XX0 Maternal care for breech presentation, not applicable or unspecified: Secondary | ICD-10-CM | POA: Diagnosis present

## 2015-12-31 LAB — CBC
HCT: 25.1 % — ABNORMAL LOW (ref 36.0–46.0)
Hemoglobin: 8.7 g/dL — ABNORMAL LOW (ref 12.0–15.0)
MCH: 28.5 pg (ref 26.0–34.0)
MCHC: 34.7 g/dL (ref 30.0–36.0)
MCV: 82.3 fL (ref 78.0–100.0)
Platelets: 167 10*3/uL (ref 150–400)
RBC: 3.05 MIL/uL — ABNORMAL LOW (ref 3.87–5.11)
RDW: 13.8 % (ref 11.5–15.5)
WBC: 6.9 10*3/uL (ref 4.0–10.5)

## 2015-12-31 LAB — BIRTH TISSUE RECOVERY COLLECTION (PLACENTA DONATION)

## 2015-12-31 MED ORDER — FERROUS FUMARATE 324 (106 FE) MG PO TABS
1.0000 | ORAL_TABLET | Freq: Two times a day (BID) | ORAL | Status: DC
  Administered 2015-12-31 – 2016-01-01 (×3): 106 mg via ORAL
  Filled 2015-12-31 (×5): qty 1

## 2015-12-31 NOTE — Progress Notes (Signed)
Subjective: Postpartum Day #1: Cesarean Delivery Patient reports tolerating PO and no problems voiding. Denies dizziness w/ ambulating. Breastfeeding going well. Nexplanon for contraception.   Objective: Vital signs in last 24 hours: Temp:  [96.6 F (35.9 C)-98.6 F (37 C)] 98.4 F (36.9 C) (10/14 0300) Pulse Rate:  [58-90] 83 (10/14 0300) Resp:  [14-20] 18 (10/14 0300) BP: (99-116)/(43-84) 101/43 (10/14 0300) SpO2:  [97 %-100 %] 98 % (10/14 0300)  Physical Exam:  General: alert, cooperative and no distress Lochia: appropriate Uterine Fundus: firm Incision: pressure dsg intact DVT Evaluation: No evidence of DVT seen on physical exam.   Recent Labs  12/29/15 1030 12/31/15 0546  HGB 11.0* 8.7*  HCT 33.1* 25.1*    Assessment/Plan: Status post Cesarean section. Doing well postoperatively.  Continue current care. Started Fe bid. Anticipate d/c in AM 10/15.  Cam HaiSHAW, Odetta Forness CNM 12/31/2015, 7:48 AM

## 2015-12-31 NOTE — Lactation Note (Signed)
This note was copied from a baby's chart. Lactation Consultation Note  Patient Name: Laura Purvis SheffieldRebecca Burrowes ZOXWR'UToday's Date: 12/31/2015 Reason for consult: Follow-up assessment   Follow up with Exp BF mom of 31 hour old infant. Infant with 9 BF for 10-30 minutes, 2 attempts, 4 voids and 7 stools since birth. LATCH Scores 7-10 by bedside RN. Infant weight 9 lb 2.2 oz with 2% weight loss since birth. Mom reports BF is going well. She denies pain with feeding. Mom reports she is working with Darin EngelsJamaila as a LC. She does not have any questions/concerns prn.    Maternal Data Formula Feeding for Exclusion: No Has patient been taught Hand Expression?: Yes Does the patient have breastfeeding experience prior to this delivery?: Yes  Feeding    LATCH Score/Interventions                      Lactation Tools Discussed/Used     Consult Status Consult Status: Follow-up Date: 01/01/16 Follow-up type: In-patient    Silas FloodSharon S Georg Ang 12/31/2015, 8:24 PM

## 2016-01-01 DIAGNOSIS — Z98891 History of uterine scar from previous surgery: Secondary | ICD-10-CM

## 2016-01-01 MED ORDER — OXYCODONE HCL 10 MG PO TABS: 10.0000 mg | ORAL_TABLET | ORAL | 0 refills | Status: DC | PRN

## 2016-01-01 MED ORDER — IBUPROFEN 600 MG PO TABS: 600.0000 mg | ORAL_TABLET | Freq: Four times a day (QID) | ORAL | 0 refills | Status: DC | PRN

## 2016-01-01 NOTE — Discharge Summary (Signed)
       OB Discharge Summary  Patient Name: Laura SheffieldRebecca Mantey DOB: May 14, 1995 MRN: [id number]  Date of admission: 12/30/2015 Delivering MD: Lorne SkeensSCHENK, NICHOLAS MICHAEL   Date of discharge: 01/01/2016  Admitting diagnosis: cpt [id number] - Breech Intrauterine pregnancy: 3390w0d     Secondary diagnosis:Active Problems:   Delivery by cesarean section for breech presentation   S/P cesarean section  Additional problems: None    Discharge diagnosis: Term Pregnancy Delivered                                                                     Post partum procedures:none  Augmentation: none  Complications: None  Hospital course:  Sceduled C/S   20 y.o. yo G3P2011 at 6590w0d was admitted to the hospital 12/30/2015 for scheduled cesarean section with the following indication:Malpresentation.  Membrane Rupture Time/Date: 12:47 PM ,12/30/2015   Patient delivered a Viable infant.12/30/2015  Details of operation can be found in separate operative note.  Pateint had an uncomplicated postpartum course.  She is ambulating, tolerating a regular diet, passing flatus, and urinating well. Patient is discharged home in stable condition on  01/01/16          Physical exam Vitals:   12/31/15 0830 12/31/15 1250 12/31/15 1803 01/01/16 0555  BP: 124/67 104/64 109/64 102/62  Pulse: 63 (!) 57 62 61  Resp: 18 18 18 14   Temp: 97.8 F (36.6 C) 97.6 F (36.4 C) 98.4 F (36.9 C) 98.1 F (36.7 C)  TempSrc: Oral Oral Oral Oral  SpO2: 100%  100%    General: alert, cooperative and no distress Lochia: appropriate Uterine Fundus: firm Incision: Dressing is saturated with old blood and serosanguinous drainage. DVT Evaluation: No evidence of DVT seen on physical exam. Labs: Lab Results  Component Value Date   WBC 6.9 12/31/2015   HGB 8.7 (L) 12/31/2015   HCT 25.1 (L) 12/31/2015   MCV 82.3 12/31/2015   PLT 167 12/31/2015   No flowsheet data found.  Discharge instruction: per After Visit Summary and "Baby and  Me Booklet".  After Visit Meds:    Medication List    TAKE these medications   acetaminophen 500 MG tablet Commonly known as:  TYLENOL Take 1,000 mg by mouth every 6 (six) hours as needed for mild pain, moderate pain or headache.   ibuprofen 600 MG tablet Commonly known as:  ADVIL,MOTRIN Take 1 tablet (600 mg total) by mouth every 6 (six) hours as needed for mild pain.   Oxycodone HCl 10 MG Tabs Take 1 tablet (10 mg total) by mouth every 4 (four) hours as needed (pain scale > 7).   prenatal multivitamin Tabs tablet Take 1 tablet by mouth at bedtime.       Diet: low salt diet  Activity: Advance as tolerated. Pelvic rest for 6 weeks.   Outpatient follow up:6 weeks Follow up Appt:No future appointments. Follow up visit: No Follow-up on file.  Postpartum contraception: Nexplanon  Newborn Data: Live born female  Birth Weight: 9 lb 4.3 oz (4205 g) APGAR: 8, 9  Baby Feeding: Breast Disposition:home with mother   01/01/2016 Hilton SinclairKaty D Mayo, MD

## 2016-01-01 NOTE — Progress Notes (Signed)
Notified resident on call (Dr Nancy MarusMayo) that patient's honeycomb was saturated, but secure after pressure dressing removed. Bleeding did not appear active.  No new orders; doctor will assess in a.m.

## 2016-01-01 NOTE — Discharge Instructions (Signed)
Cesarean Delivery, Care After  Refer to this sheet in the next few weeks. These instructions provide you with information on caring for yourself after your procedure. Your health care provider may also give you specific instructions. Your treatment has been planned according to current medical practices, but problems sometimes occur. Call your health care provider if you have any problems or questions after you go home.  HOME CARE INSTRUCTIONS   Only take over-the-counter or prescription medications as directed by your health care provider.   Do not drink alcohol, especially if you are breastfeeding or taking medication to relieve pain.   Do not chew or smoke tobacco.   Continue to use good perineal care. Good perineal care includes:    Wiping your perineum from front to back.    Keeping your perineum clean.   Check your surgical cut (incision) daily for increased redness, drainage, swelling, or separation of skin.   Clean your incision gently with soap and water every day, and then pat it dry. If your health care provider says it is okay, leave the incision uncovered. Use a bandage (dressing) if the incision is draining fluid or appears irritated. If the adhesive strips across the incision do not fall off within 7 days, carefully peel them off.   Hug a pillow when coughing or sneezing until your incision is healed. This helps to relieve pain.   Do not use tampons or douche until your health care provider says it is okay.   Shower, wash your hair, and take tub baths as directed by your health care provider.   Wear a well-fitting bra that provides breast support.   Limit wearing support panties or control-top hose.   Drink enough fluids to keep your urine clear or pale yellow.   Eat high-fiber foods such as whole grain cereals and breads, brown rice, beans, and fresh fruits and vegetables every day. These foods may help prevent or relieve constipation.   Resume activities such as climbing stairs,  driving, lifting, exercising, or traveling as directed by your health care provider.   Talk to your health care provider about resuming sexual activities. This is dependent upon your risk of infection, your rate of healing, and your comfort and desire to resume sexual activity.   Try to have someone help you with your household activities and your newborn for at least a few days after you leave the hospital.   Rest as much as possible. Try to rest or take a nap when your newborn is sleeping.   Increase your activities gradually.   Keep all of your scheduled postpartum appointments. It is very important to keep your scheduled follow-up appointments. At these appointments, your health care provider will be checking to make sure that you are healing physically and emotionally.  SEEK MEDICAL CARE IF:    You are passing large clots from your vagina. Save any clots to show your health care provider.   You have a foul smelling discharge from your vagina.   You have trouble urinating.   You are urinating frequently.   You have pain when you urinate.   You have a change in your bowel movements.   You have increasing redness, pain, or swelling near your incision.   You have pus draining from your incision.   Your incision is separating.   You have painful, hard, or reddened breasts.   You have a severe headache.   You have blurred vision or see spots.   You feel sad   or depressed.   You have thoughts of hurting yourself or your newborn.   You have questions about your care, the care of your newborn, or medications.   You are dizzy or light-headed.   You have a rash.   You have pain, redness, or swelling at the site of the removed intravenous access (IV) tube.   You have nausea or vomiting.   You stopped breastfeeding and have not had a menstrual period within 12 weeks of stopping.   You are not breastfeeding and have not had a menstrual period within 12 weeks of delivery.   You have a fever.  SEEK  IMMEDIATE MEDICAL CARE IF:   You have persistent pain.   You have chest pain.   You have shortness of breath.   You faint.   You have leg pain.   You have stomach pain.   Your vaginal bleeding saturates 2 or more sanitary pads in 1 hour.  MAKE SURE YOU:    Understand these instructions.   Will watch your condition.   Will get help right away if you are not doing well or get worse.     This information is not intended to replace advice given to you by your health care provider. Make sure you discuss any questions you have with your health care provider.     Document Released: 11/25/2001 Document Revised: 03/26/2014 Document Reviewed: 10/31/2011  Elsevier Interactive Patient Education 2016 [employer]

## 2016-01-01 NOTE — Lactation Note (Signed)
This note was copied from a baby's chart. Lactation Consultation Note  Patient Name: Laura Purvis SheffieldRebecca Abt ZOXWR'UToday's Date: 01/01/2016 Reason for consult: Follow-up assessment;Other (Comment);Infant weight loss (6% weight loss )  Baby is 1244 hours old, and has been consistent with breastfeeding and latching.  Per mom breast are feeling fuller, warmer, and heavier. Baby awake and ready to feed. Mom positioned  Baby for cross cradle and needed assist to position baby in alignment. LC reviewed basics of latching and the importance  Of depth. Multiple swallows noted, increased with breast compressions. Per mom more comfortable.  Baby fed 10 mins and became non - nutritive , so LC showed mom how to release suction . Nipple well rounded and  LC reviewed hand expressing and had mom apply EBM to nipples. Mom reminded mom to use her breast milk 1st to nipples  And coconut oil 2nd.  LC instructed mom on the use of breast shells for reverse pressure. Sore nipple and engorgement prevention and tx reviewed. Mom already has hand pump , with increased flange #27 if needed. Per mom will have a resource Systems developerJamilla Walker RN, PrintmakerBCLC ( private [facility] - LC in the community)  Mother informed of post-discharge support and given phone number to the lactation department, including services for phone call assistance; out-patient appointments; and breastfeeding support group. List of other breastfeeding resources in the community given in the handout. Encouraged mother to call for problems or concerns related to breastfeeding.    Maternal Data Has patient been taught Hand Expression?: Yes  Feeding Feeding Type: Breast Fed Length of feed: 10 min  LATCH Score/Interventions Latch: Grasps breast easily, tongue down, lips flanged, rhythmical sucking. Intervention(s): Adjust position;Assist with latch;Breast massage;Breast compression  Audible Swallowing: Spontaneous and intermittent  Type of Nipple: Everted at rest and  after stimulation  Comfort (Breast/Nipple): Soft / non-tender     Hold (Positioning): Assistance needed to correctly position infant at breast and maintain latch. Intervention(s): Breastfeeding basics reviewed;Support Pillows;Position options;Skin to skin  LATCH Score: 9  Lactation Tools Discussed/Used Tools: Shells;Pump;Flanges (LC instructed mom on the shells due to sensistive tissue ) Shell Type: Inverted Breast pump type: Manual (mom already has a hand pump with increased flange when milk comes in )   Consult Status Consult Status: Complete Date: 01/01/16    Kathrin GreathouseMargaret Ann Vivion Romano 01/01/2016, 9:47 AM

## 2016-01-01 NOTE — Progress Notes (Signed)
Post discharge chart review completed.  

## 2016-01-18 ENCOUNTER — Encounter (HOSPITAL_COMMUNITY): Payer: Self-pay | Admitting: *Deleted

## 2016-02-14 ENCOUNTER — Encounter: Payer: Self-pay | Admitting: Obstetrics & Gynecology

## 2016-02-14 ENCOUNTER — Encounter (INDEPENDENT_AMBULATORY_CARE_PROVIDER_SITE_OTHER): Payer: Self-pay

## 2016-02-14 ENCOUNTER — Ambulatory Visit (INDEPENDENT_AMBULATORY_CARE_PROVIDER_SITE_OTHER): Payer: Medicaid Other | Admitting: Obstetrics & Gynecology

## 2016-02-14 MED ORDER — NORETHINDRONE 0.35 MG PO TABS: 1.0000 | ORAL_TABLET | Freq: Every day | ORAL | 11 refills | Status: DC

## 2016-02-14 NOTE — Progress Notes (Signed)
Post Partum Exam  Laura SheffieldRebecca [name] is a 20 y.o. 773P2011 female who presents for a postpartum visit. She is 6 weeks postpartum following a low cervical transverse Cesarean section. I have fully reviewed the prenatal and intrapartum course. The delivery was at 39 gestational weeks.  Anesthesia: epidural. Postpartum course has been unremarkable except for where she had the epidural. Baby's course has been unremarkable. Baby is feeding by breast. Bleeding brown. Bowel function is normal. Bladder function is normal. Patient is sexually active. Contraception method is none. Postpartum depression screening:neg Pt request OCP's  The following portions of the patient's history were reviewed and updated as appropriate: allergies, current medications, past family history, past medical history, past social history, past surgical history and problem list.  Review of Systems Pertinent items noted in HPI and remainder of comprehensive ROS otherwise negative.    Objective:    BP 116/78 mmHg  Pulse 78  Resp 16  Ht 5\' 5"  (1.651 m)  Wt 211 lb (95.709 kg)  BMI 35.11 kg/m2  Breastfeeding? Yes  General:  alert, cooperative and no distress   Breasts:  inspection negative, no nipple discharge or bleeding, no masses or nodularity palpable  Lungs: clear to auscultation bilaterally  Heart:  regular rate and rhythm  Abdomen: soft, non-tender; bowel sounds normal; no masses,  no organomegaly   Vulva:  normal  Vagina: normal vagina, small amount of brownish dischare coming from the os.  Cervix:  no cervical motion tenderness and no lesions  Corpus: nml size, mild tenderness, no CMT, no mass  Adnexa:  no mass, fullness, tenderness  Rectal Exam: Not performed.        Assessment:    Nml postpartum exam.  Vaginal bleeding at 6 qweeks Sciatica  Plan:   1. Contraception: oral progesterone-only contraceptive 2. Pt wants to try chiropractor, if not better will refer to PT 3.  If bleeding Continues for 2 more  weeks, we'll get transvaginal ultrasound 3. Follow up in: 1 year or as needed.

## 2016-02-14 NOTE — Addendum Note (Signed)
Addended by: Granville LewisLARK, LORA L on: 02/14/2016 03:21 PM   Modules accepted: Orders

## 2016-02-27 ENCOUNTER — Telehealth: Payer: Self-pay | Admitting: *Deleted

## 2016-02-27 DIAGNOSIS — N939 Abnormal uterine and vaginal bleeding, unspecified: Secondary | ICD-10-CM

## 2016-02-27 NOTE — Telephone Encounter (Signed)
Pt seen for PP visit in Nov 2017 and was still having vaginal bleeding.  The bleeding has continued and per Dr Penne LashLeggett note, pt was to return for a TVU.  The orders were placed and will await results.

## 2016-03-01 ENCOUNTER — Ambulatory Visit (INDEPENDENT_AMBULATORY_CARE_PROVIDER_SITE_OTHER): Payer: Medicaid Other

## 2016-03-01 DIAGNOSIS — N939 Abnormal uterine and vaginal bleeding, unspecified: Secondary | ICD-10-CM | POA: Diagnosis not present

## 2016-03-08 ENCOUNTER — Telehealth: Payer: Self-pay

## 2016-03-09 NOTE — Telephone Encounter (Signed)
a 

## 2016-03-20 ENCOUNTER — Telehealth: Payer: Self-pay | Admitting: *Deleted

## 2016-03-20 ENCOUNTER — Other Ambulatory Visit: Payer: Self-pay | Admitting: *Deleted

## 2016-03-20 NOTE — Telephone Encounter (Signed)
Pt stated that she has stopped bleeding and at the present she will stay on the Micronor.  If bleeding returns will switch to Ortho Cyclen per Dr Penne LashLeggett

## 2016-09-04 ENCOUNTER — Ambulatory Visit: Payer: Medicaid Other | Admitting: Obstetrics & Gynecology

## 2016-12-22 IMAGING — US US OB TRANSVAGINAL
1 series · 15 of 28 positions shown · non-contrast
Comparison: None.

CLINICAL DATA: Vaginal bleeding and pain.

EXAM:
OBSTETRIC <14 WK US AND TRANSVAGINAL OB US
TECHNIQUE: Both transabdominal and transvaginal ultrasound examinations were
performed for complete evaluation of the gestation as well as the
maternal uterus, adnexal regions, and pelvic cul-de-sac.
Transvaginal technique was performed to assess early pregnancy.

[Series 1: us ob transvaginal · 15 of 45 slices shown]
[im 1/45]
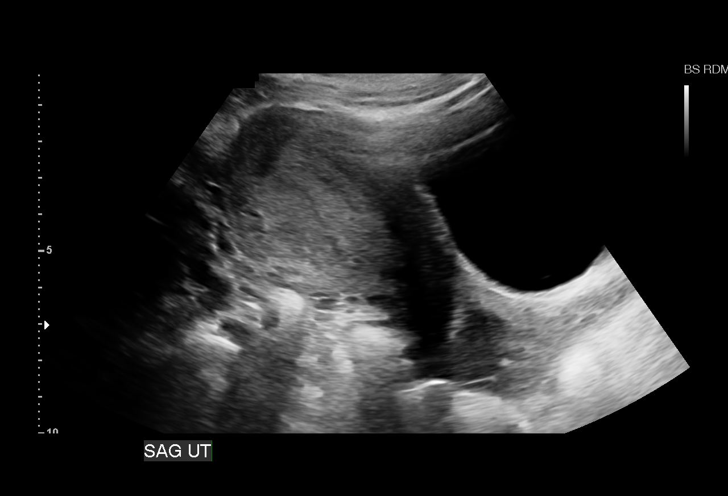
[im 4/45]
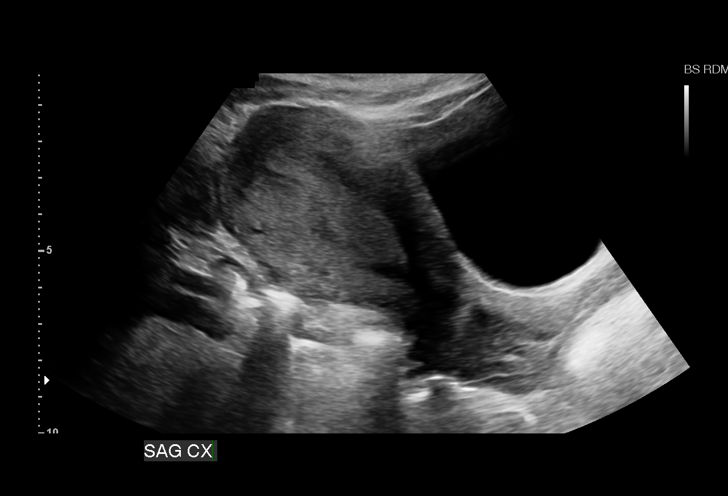
[im 7/45]
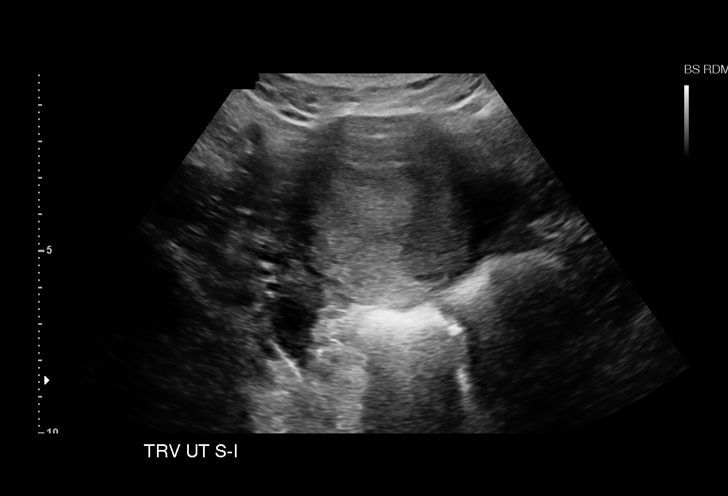
[im 10/45]
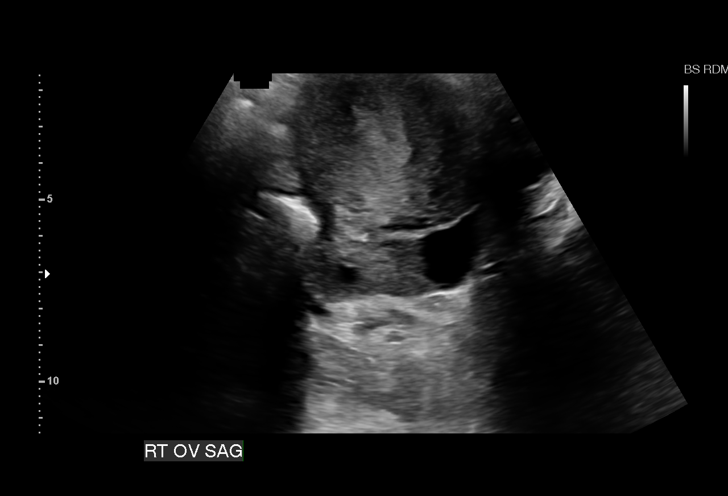
[im 14/45]
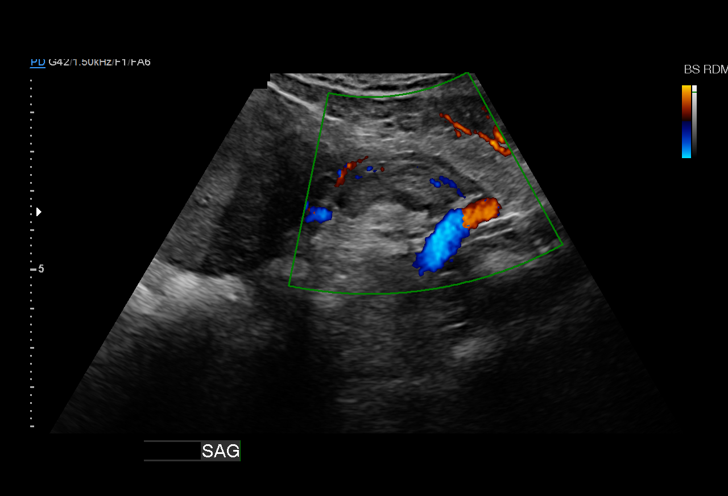
[im 17/45]
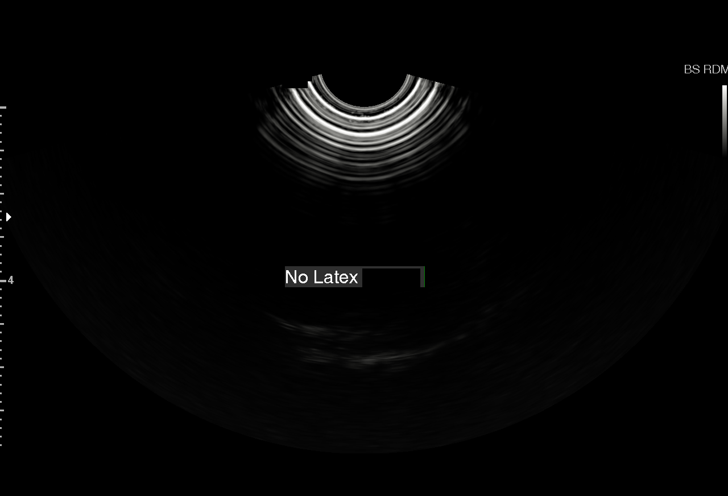
[im 20/45]
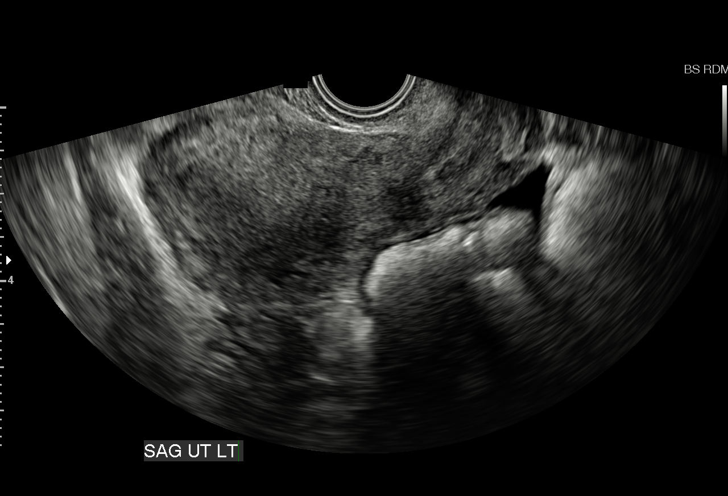
[im 23/45]
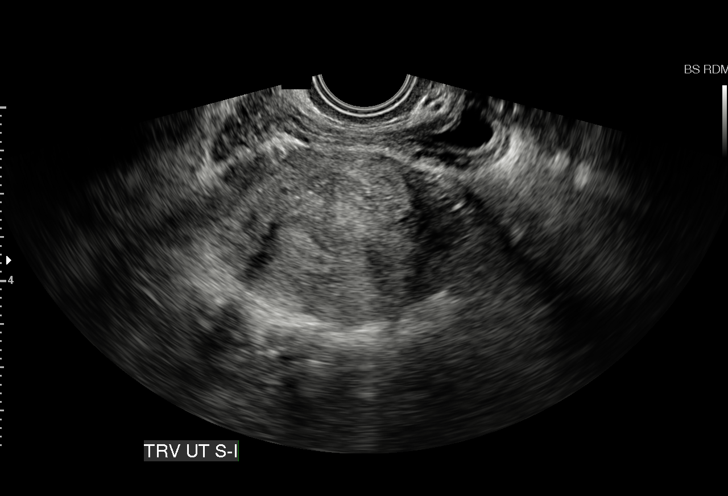
[im 25/45]
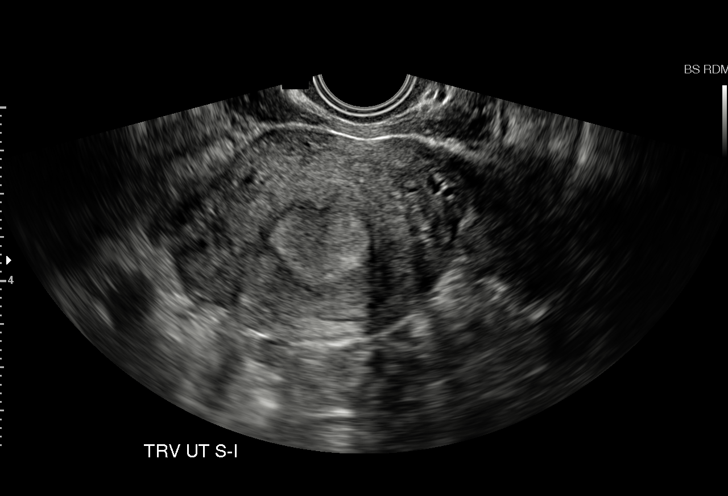
[im 28/45]
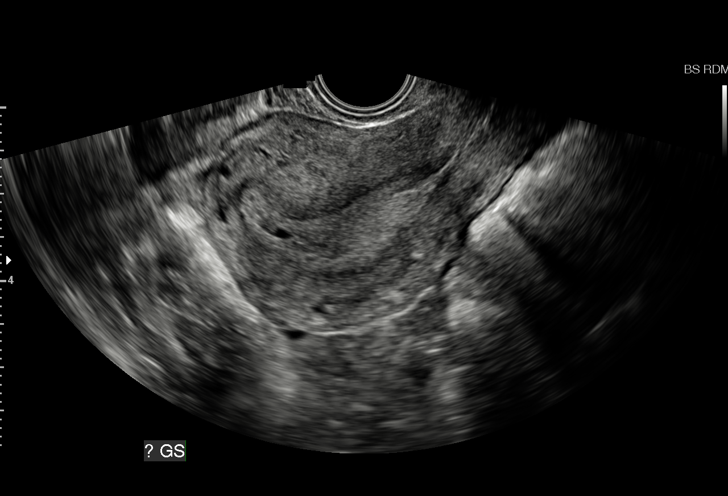
[im 31/45]
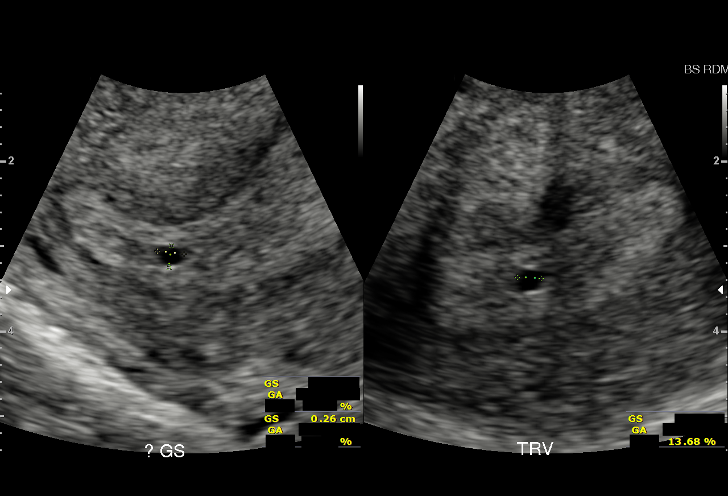
[im 35/45]
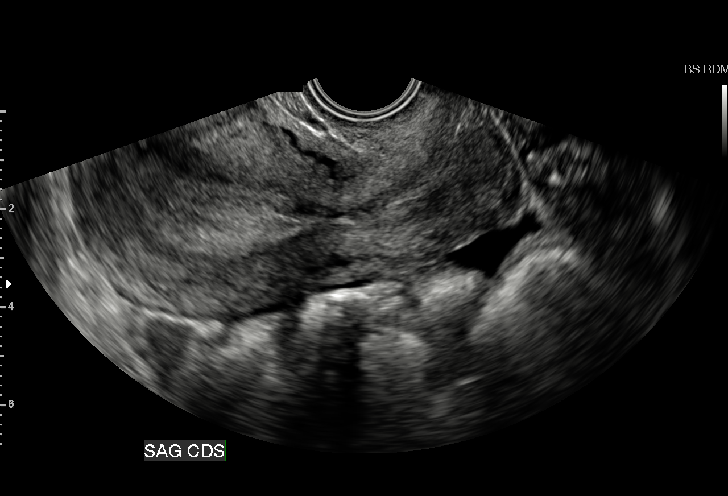
[im 38/45]
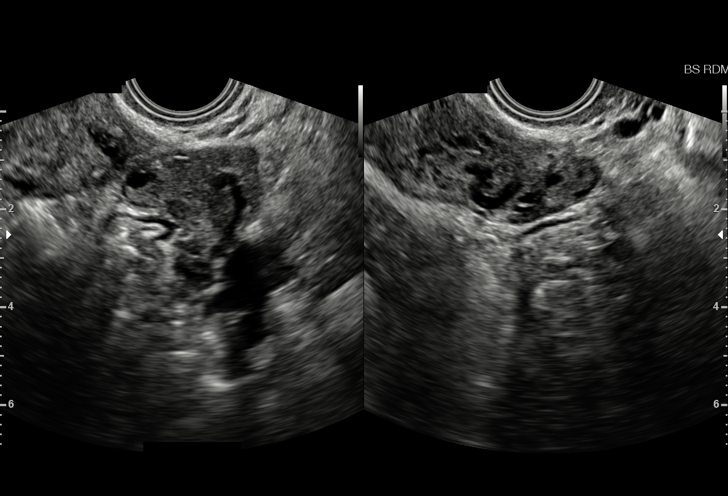
[im 41/45]
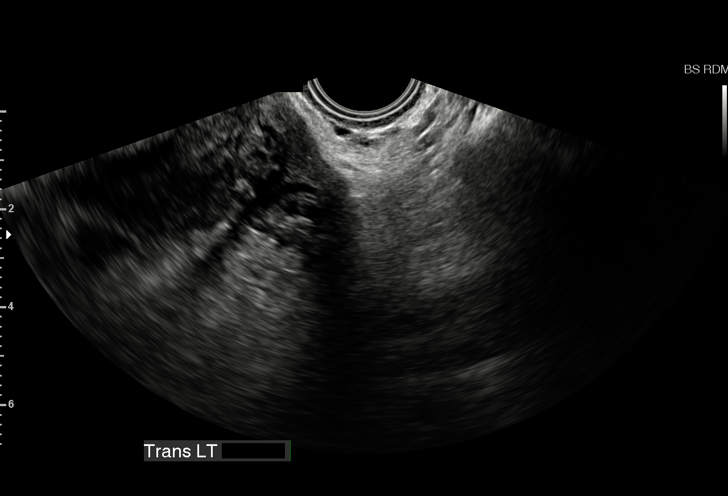
[im 45/45]
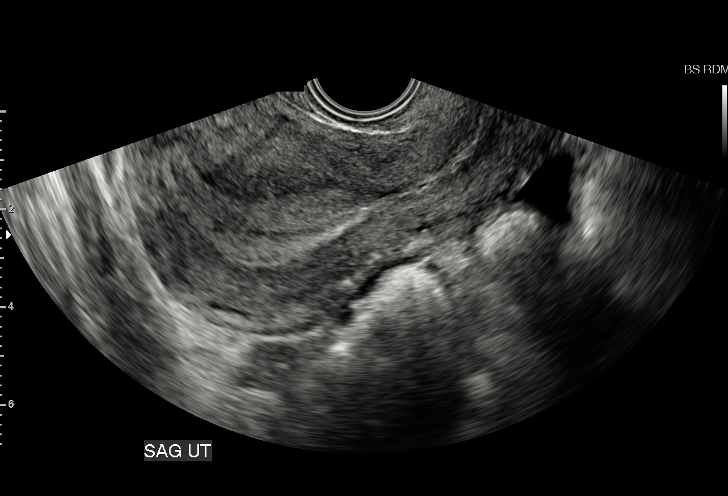

[15 of 28 positions shown; findings below may reference images not displayed]

FINDINGS: Intrauterine gestational sac: A well-defined double decidual sac is
not seen. There is a 3 mm questionable gestational sac within the
uterus.

Yolk sac:  Not visualized

Embryo:  Not visualized

Cardiac Activity: Not visualized

MSD: 3 mm  mm   5 w   0  d

Maternal uterus/adnexae: There is no demonstrable subchorionic
hemorrhage. Cervical os is closed. There are no extrauterine pelvic
or adnexal masses. There is a small amount of free pelvic fluid,
however.
IMPRESSION: Questionable gestational sac within the uterus. If this structure in
the uterus indeed is a gestational sac, estimated gestational age is
approximately 5 weeks. An endometrial pseudosac cannot be excluded.
There is a small amount of free fluid in the maternal pelvis but no
extrauterine pelvic or adnexal masses appreciable.

It should be noted that ectopic gestation with a so-called pseudosac
within the endometrium cannot be excluded at this time. This
circumstance warrants close clinical and serial beta HCG laboratory
evaluations. Repeat timing of this ultrasound will in part depend on
serial beta HCG evaluations.

These results will be called to the ordering clinician or
representative by the Radiologist Assistant, and communication
documented in the PACS or zVision Dashboard.

## 2017-02-03 ENCOUNTER — Other Ambulatory Visit: Payer: Self-pay

## 2017-02-03 ENCOUNTER — Inpatient Hospital Stay (HOSPITAL_COMMUNITY)
Admission: AD | Admit: 2017-02-03 | Discharge: 2017-02-03 | Disposition: A | Discharge status: Home / Self Care | Payer: Medicaid Other | Source: Ambulatory Visit | Attending: Obstetrics and Gynecology | Admitting: Obstetrics and Gynecology

## 2017-02-03 ENCOUNTER — Encounter (HOSPITAL_COMMUNITY): Payer: Self-pay

## 2017-02-03 ENCOUNTER — Inpatient Hospital Stay (HOSPITAL_COMMUNITY): Payer: Medicaid Other

## 2017-02-03 DIAGNOSIS — R1031 Right lower quadrant pain: Secondary | ICD-10-CM | POA: Insufficient documentation

## 2017-02-03 DIAGNOSIS — Z87891 Personal history of nicotine dependence: Secondary | ICD-10-CM | POA: Diagnosis not present

## 2017-02-03 DIAGNOSIS — O26891 Other specified pregnancy related conditions, first trimester: Secondary | ICD-10-CM

## 2017-02-03 DIAGNOSIS — Z3491 Encounter for supervision of normal pregnancy, unspecified, first trimester: Secondary | ICD-10-CM

## 2017-02-03 DIAGNOSIS — R109 Unspecified abdominal pain: Secondary | ICD-10-CM | POA: Diagnosis present

## 2017-02-03 DIAGNOSIS — Z3A01 Less than 8 weeks gestation of pregnancy: Secondary | ICD-10-CM | POA: Insufficient documentation

## 2017-02-03 DIAGNOSIS — O26899 Other specified pregnancy related conditions, unspecified trimester: Secondary | ICD-10-CM

## 2017-02-03 LAB — CBC
HCT: 37.3 % (ref 36.0–46.0)
Hemoglobin: 12.7 g/dL (ref 12.0–15.0)
MCH: 31 pg (ref 26.0–34.0)
MCHC: 34 g/dL (ref 30.0–36.0)
MCV: 91 fL (ref 78.0–100.0)
PLATELETS: 212 10*3/uL (ref 150–400)
RBC: 4.1 MIL/uL (ref 3.87–5.11)
RDW: 12.6 % (ref 11.5–15.5)
WBC: 5.3 10*3/uL (ref 4.0–10.5)

## 2017-02-03 LAB — WET PREP, GENITAL
CLUE CELLS WET PREP: NONE SEEN
SPERM: NONE SEEN
TRICH WET PREP: NONE SEEN
YEAST WET PREP: NONE SEEN

## 2017-02-03 LAB — URINALYSIS, ROUTINE W REFLEX MICROSCOPIC
Bilirubin Urine: NEGATIVE
Glucose, UA: NEGATIVE mg/dL
Hgb urine dipstick: NEGATIVE
Ketones, ur: NEGATIVE mg/dL
LEUKOCYTES UA: NEGATIVE
Nitrite: NEGATIVE
PROTEIN: NEGATIVE mg/dL
SPECIFIC GRAVITY, URINE: 1.017 (ref 1.005–1.030)
pH: 7 (ref 5.0–8.0)

## 2017-02-03 LAB — POCT PREGNANCY, URINE: Preg Test, Ur: POSITIVE — AB

## 2017-02-03 LAB — HCG, QUANTITATIVE, PREGNANCY: HCG, BETA CHAIN, QUANT, S: 13817 m[IU]/mL — AB (ref <5)

## 2017-02-03 NOTE — MAU Note (Signed)
Patient presents with onset of pelvic pain that woke her up out of her sleep this morning.  Has had 4 positive home UPT

## 2017-02-03 NOTE — MAU Provider Note (Signed)
History     CSN: [id number]  Arrival date and time: 02/03/17 [id number]   First Provider Initiated Contact with Patient 02/03/17 [phone]      Chief Complaint  Patient presents with  . Abdominal Pain   G4P2012 @[redacted]w[redacted]d  by sure LMP here with lower abdominal cramping. Pain started this am. Describes as cramping and worse in RLQ. She did not use anything for the pain but tried a shower and had no relief. No VB or discharge. No fevers. No urinary sx.   OB History    Gravida Para Term Preterm AB Living   4 2 2   1 1    SAB TAB Ectopic Multiple Live Births   1     0 2      Past Medical History:  Diagnosis Date  . Medical history non-contributory     Past Surgical History:  Procedure Laterality Date  . APPENDECTOMY    . CESAREAN SECTION N/A 12/30/2015   Performed by Adam PhenixArnold, James G[facility]  . CHOLECYSTECTOMY      Family History  Problem Relation Age of Onset  . Cancer Maternal Aunt   . Cancer Maternal Uncle   . Diabetes Paternal Aunt   . Diabetes Paternal Uncle   . Diabetes Maternal Grandmother   . Cancer Maternal Grandfather   . Cancer Paternal Grandmother   . Diabetes Paternal Grandmother   . Diabetes Paternal Grandfather     Social History   Tobacco Use  . Smoking status: Former Games developermoker  . Smokeless tobacco: Former Engineer, waterUser  Substance Use Topics  . Alcohol use: No  . Drug use: No    Allergies:  Allergies  Allergen Reactions  . Divalproex Sodium Other (See Comments)    Reaction:  Memory loss   . Demerol [Meperidine] Hives    No medications prior to admission.    Review of Systems  Constitutional: Negative for fever.  Gastrointestinal: Positive for nausea. Negative for constipation, diarrhea and vomiting.  Genitourinary: Negative for dysuria, frequency, hematuria, urgency, vaginal bleeding and vaginal discharge.   Physical Exam   Blood pressure 120/72, pulse 63, temperature 98 F (36.7 C), temperature source Oral, resp. rate 16, height 5\' 7"   (1.702 m), weight 169 lb (76.7 kg), last menstrual period 12/19/2016, currently breastfeeding.  Physical Exam  Constitutional: She is oriented to person, place, and time. She appears well-developed and well-nourished. No distress.  HENT:  Head: Normocephalic and atraumatic.  Neck: Normal range of motion.  Cardiovascular: Normal rate.  Respiratory: Effort normal. No respiratory distress.  GI: Soft. She exhibits no distension and no mass. There is no tenderness. There is no rebound and no guarding.  Genitourinary:  Genitourinary Comments: External: no lesions or erythema Vagina: rugated, pink, moist, thin mucous discharge, no blood Uterus: non enlarged, anteverted, + tender, no CMT Adnexae: no masses, + tenderness left, + tenderness right   Musculoskeletal: Normal range of motion.  Neurological: She is alert and oriented to person, place, and time.  Skin: Skin is warm and dry.  Psychiatric: She has a normal mood and affect.   Results for orders placed or performed during the hospital encounter of 02/03/17 (from the past 24 hour(s))  Urinalysis, Routine w reflex microscopic     Status: None   Collection Time: 02/03/17  9:10 AM  Result Value Ref Range   Color, Urine YELLOW YELLOW   APPearance CLEAR CLEAR   Specific Gravity, Urine 1.017 1.005 - 1.030   pH 7.0 5.0 - 8.0  Glucose, UA NEGATIVE NEGATIVE mg/dL   Hgb urine dipstick NEGATIVE NEGATIVE   Bilirubin Urine NEGATIVE NEGATIVE   Ketones, ur NEGATIVE NEGATIVE mg/dL   Protein, ur NEGATIVE NEGATIVE mg/dL   Nitrite NEGATIVE NEGATIVE   Leukocytes, UA NEGATIVE NEGATIVE  Pregnancy, urine POC     Status: Abnormal   Collection Time: 02/03/17  9:20 AM  Result Value Ref Range   Preg Test, Ur POSITIVE (A) NEGATIVE  Wet prep, genital     Status: Abnormal   Collection Time: 02/03/17  9:24 AM  Result Value Ref Range   Yeast Wet Prep HPF POC NONE SEEN NONE SEEN   Trich, Wet Prep NONE SEEN NONE SEEN   Clue Cells Wet Prep HPF POC NONE SEEN  NONE SEEN   WBC, Wet Prep HPF POC FEW (A) NONE SEEN   Sperm NONE SEEN   CBC     Status: None   Collection Time: 02/03/17  9:39 AM  Result Value Ref Range   WBC 5.3 4.0 - 10.5 K/uL   RBC 4.10 3.87 - 5.11 MIL/uL   Hemoglobin 12.7 12.0 - 15.0 g/dL   HCT 16.1 09.6 - 04.5 %   MCV 91.0 78.0 - 100.0 fL   MCH 31.0 26.0 - 34.0 pg   MCHC 34.0 30.0 - 36.0 g/dL   RDW 40.9 81.1 - 91.4 %   Platelets 212 150 - 400 K/uL  hCG, quantitative, pregnancy     Status: Abnormal   Collection Time: 02/03/17  9:39 AM  Result Value Ref Range   hCG, Beta Chain, Quant, S 13,817 (H) <5 mIU/mL   US Ob Comp Less 14 Wks  Result Date: 02/03/2017 CLINICAL DATA:  Cramping pain. EXAM: OBSTETRIC <14 WK [place] AND TRANSVAGINAL OB US TECHNIQUE: Both transabdominal and transvaginal ultrasound examinations were performed for complete evaluation of the gestation as well as the maternal uterus, adnexal regions, and pelvic cul-de-sac. Transvaginal technique was performed to assess early pregnancy. COMPARISON:  None. FINDINGS: Intrauterine gestational sac: Single. Appropriate surrounding decidual reaction. Yolk sac:  Identified. Embryo:  Identified Cardiac Activity: Identified Heart Rate: 123  bpm MSD:   mm    w     d CRL:  4  mm   6 w   0 d                  [place] EDC: 09/29/2017 Subchorionic hemorrhage:  None visualized. Maternal uterus/adnexae: Maternal ovaries are unremarkable, probable small corpus luteum within the right ovary. There is no mass or free fluid identified in either adnexal region. IMPRESSION: 1. Single live intrauterine pregnancy with estimated gestational age of [redacted] weeks and 0 days. No subchorionic hemorrhage or other complicating feature identified. 2. Maternal ovaries are unremarkable and there is no mass or free fluid seen in either adnexal region. Electronically Signed   By: Bary Richard M.D.   On: 02/03/2017 10:19   US Ob Transvaginal  Result Date: 02/03/2017 CLINICAL DATA:  Cramping pain. EXAM: OBSTETRIC <14 WK [place]  AND TRANSVAGINAL OB US TECHNIQUE: Both transabdominal and transvaginal ultrasound examinations were performed for complete evaluation of the gestation as well as the maternal uterus, adnexal regions, and pelvic cul-de-sac. Transvaginal technique was performed to assess early pregnancy. COMPARISON:  None. FINDINGS: Intrauterine gestational sac: Single. Appropriate surrounding decidual reaction. Yolk sac:  Identified. Embryo:  Identified Cardiac Activity: Identified Heart Rate: 123  bpm MSD:   mm    w     d CRL:  4  mm   6  w   0 d                  US EDC: 09/29/2017 Subchorionic hemorrhage:  None visualized. Maternal uterus/adnexae: Maternal ovaries are unremarkable, probable small corpus luteum within the right ovary. There is no mass or free fluid identified in either adnexal region. IMPRESSION: 1. Single live intrauterine pregnancy with estimated gestational age of [redacted] weeks and 0 days. No subchorionic hemorrhage or other complicating feature identified. 2. Maternal ovaries are unremarkable and there is no mass or free fluid seen in either adnexal region. Electronically Signed   By: Bary RichardStan  Maynard M.D.   On: 02/03/2017 10:19    MAU Course  Procedures  MDM Labs ordered and reviewed. No evidence of acute abdominal or pelvic process. Normal IUP on US consistent with LMP dating. Pt reassured. Stable for discharge home.  Assessment and Plan   1. Normal intrauterine pregnancy on prenatal ultrasound in first trimester   2. Abdominal cramping affecting pregnancy    Discharge home Follow up in OB office as scheduled SAB/return precautions  Allergies as of 02/03/2017      Reactions   Divalproex Sodium Other (See Comments)   Reaction:  Memory loss    Demerol [meperidine] Hives      Medication List    TAKE these medications   acetaminophen 500 MG tablet Commonly known as:  TYLENOL Take 1,000 mg by mouth every 6 (six) hours as needed for mild pain, moderate pain or headache.   prenatal multivitamin  Tabs tablet Take 1 tablet by mouth at bedtime.      Donette LarryMelanie Hermenegildo Clausen, CNM 02/03/2017, 11:30 AM

## 2017-02-03 NOTE — Discharge Instructions (Signed)

## 2017-02-04 LAB — GC/CHLAMYDIA PROBE AMP (~~LOC~~) NOT AT ARMC
CHLAMYDIA, DNA PROBE: NEGATIVE
NEISSERIA GONORRHEA: NEGATIVE

## 2017-02-22 ENCOUNTER — Encounter: Payer: Self-pay | Admitting: Certified Nurse Midwife

## 2017-02-22 ENCOUNTER — Ambulatory Visit (INDEPENDENT_AMBULATORY_CARE_PROVIDER_SITE_OTHER): Payer: Medicaid Other | Admitting: Certified Nurse Midwife

## 2017-02-22 ENCOUNTER — Encounter: Payer: Self-pay | Admitting: Obstetrics & Gynecology

## 2017-02-22 VITALS — BP 107/65 | HR 75 | Wt 160.0 lb

## 2017-02-22 DIAGNOSIS — O021 Missed abortion: Secondary | ICD-10-CM | POA: Diagnosis not present

## 2017-02-22 DIAGNOSIS — Z3480 Encounter for supervision of other normal pregnancy, unspecified trimester: Secondary | ICD-10-CM | POA: Insufficient documentation

## 2017-02-22 LAB — CBC
HCT: 39.8 % (ref 35.0–45.0)
Hemoglobin: 13.5 g/dL (ref 11.7–15.5)
MCH: 30.4 pg (ref 27.0–33.0)
MCHC: 33.9 g/dL (ref 32.0–36.0)
MCV: 89.6 fL (ref 80.0–100.0)
MPV: 11.3 fL (ref 7.5–12.5)
PLATELETS: 264 10*3/uL (ref 140–400)
RBC: 4.44 10*6/uL (ref 3.80–5.10)
RDW: 11.7 % (ref 11.0–15.0)
WBC: 4.4 10*3/uL (ref 3.8–10.8)

## 2017-02-22 MED ORDER — OXYCODONE-ACETAMINOPHEN 5-325 MG PO TABS: 1.0000 | ORAL_TABLET | ORAL | 0 refills | Status: DC | PRN

## 2017-02-22 MED ORDER — IBUPROFEN 600 MG PO TABS: 600.0000 mg | ORAL_TABLET | Freq: Four times a day (QID) | ORAL | 0 refills | Status: DC | PRN

## 2017-02-22 MED ORDER — MISOPROSTOL 200 MCG PO TABS: 800.0000 ug | ORAL_TABLET | Freq: Once | ORAL | 0 refills | Status: DC

## 2017-02-22 MED ORDER — PROMETHAZINE HCL 25 MG PO TABS: 25.0000 mg | ORAL_TABLET | Freq: Four times a day (QID) | ORAL | 0 refills | Status: DC | PRN

## 2017-02-22 NOTE — Progress Notes (Signed)
Subjective:     Laura [name] is a 21 y.o. female 205 100 [id number] @[redacted]w[redacted]d  by LMP here for her first prenatal visit.  She denies VB or pain. Having some nausea and dizziness. She was seen in MAU 3 weeks ago for cramping and US showed viable IUP @[redacted]w[redacted]d .    Gynecologic History Patient's last menstrual period was 12/19/2016. Contraception: none Last Pap: 05/2015. Results were: normal Last mammogram: n/a.   Obstetric History OB History  Gravida Para Term Preterm AB Living  4 2 2   1 2   SAB TAB Ectopic Multiple Live Births  1     0 2    # Outcome Date GA Lbr Len/2nd Weight Sex Delivery Anes PTL Lv  4 Current           3 Term 12/30/15 270w0d  9 lb 4.3 oz (4.205 kg) M CS-LTranv Spinal  LIV  2 SAB 2016          1 Term 06/12/13     Vag-Spont   LIV     The following portions of the patient's history were reviewed and updated as appropriate: allergies, current medications, past family history, past medical history, past social history, past surgical history and problem list.  Review of Systems No VB, No pain, No syncope, +dizziness    Objective:    BP 107/65   Pulse 75   Wt 160 lb (72.6 kg)   LMP 12/19/2016   BMI 25.06 kg/m  General appearance: alert, cooperative and no distress Head: Normocephalic, without obvious abnormality, atraumatic Lungs: nml rate and effort Heart: nml rate Neurologic: Grossly normal Bedside US per Clark, RN> IUGS measuring 6wks, FP measuring <6 weeks with no cardiac activity, YS present  Assessment:  MAB   Plan:     Emotional support provided to pt and spouse Discussed options for MAB including expectant mngt, medical mngt, and surgical mngt>pt and spouse prefer medical mngt. Recent Hgb 12.7, CBC repeated today Rx Cytotec 800 po x1 Rx Percocet #10 Rx Ibuprofen #30 Rx Phenergan #30 Return to office in 2 weeks Bleeding/pain precautions

## 2017-02-22 NOTE — Progress Notes (Signed)
Pt c/o's dizziness and easily bruising.  Bedside U/S shows IUP with small fetal pole 4.672mm but no cardiac activity.  GS 1901x19.468mm  Yolk sac is also seen

## 2017-02-26 ENCOUNTER — Encounter: Payer: Self-pay | Admitting: Certified Nurse Midwife

## 2017-03-08 ENCOUNTER — Ambulatory Visit (INDEPENDENT_AMBULATORY_CARE_PROVIDER_SITE_OTHER): Payer: Medicaid Other | Admitting: Advanced Practice Midwife

## 2017-03-08 ENCOUNTER — Encounter: Payer: Self-pay | Admitting: Advanced Practice Midwife

## 2017-03-08 VITALS — BP 108/72 | HR 71 | Resp 16 | Ht 67.0 in | Wt 161.0 lb

## 2017-03-08 DIAGNOSIS — O021 Missed abortion: Secondary | ICD-10-CM | POA: Diagnosis not present

## 2017-03-08 DIAGNOSIS — N96 Recurrent pregnancy loss: Secondary | ICD-10-CM | POA: Diagnosis not present

## 2017-03-08 LAB — TSH: TSH: 1.94 mIU/L

## 2017-03-08 NOTE — Progress Notes (Signed)
Subjective:     Patient ID: Laura [name], female   DOB: [birth year], 21 y.o.   MRN: [id number]  HPI: [id number] Here for F/U missed AB. Took Cytotec 12/7. Clearly passed tissue 12/11. Bleeding scant now. Strongly desires pregnancy. Many questions about multiple miscarriages. Think she may have also had one other that was not Dx'd at the time. Requesting work-up for recurrent AB.    Review of Systems  Constitutional: Negative for chills and fever.  Gastrointestinal: Negative for abdominal pain.  Genitourinary: Positive for vaginal bleeding (scant). Negative for vaginal discharge.       Objective:BP 108/72   Pulse 71   Resp 16   Ht 5\' 7"  (1.702 m)   Wt 161 lb (73 kg)   LMP 12/19/2016   BMI 25.22 kg/m     Physical Exam  Constitutional: She is oriented to person, place, and time. She appears well-developed and well-nourished. No distress.  Cardiovascular: Normal rate.  Pulmonary/Chest: Effort normal. No respiratory distress.  Genitourinary:  Genitourinary Comments: Deferred  Neurological: She is alert and oriented to person, place, and time.  Skin: Skin is warm and dry.  Psychiatric: She has a normal mood and affect.  Nursing note and vitals reviewed.      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 Out office will ask MFM if they can as well. CBC, Quant, TSH Support given    Katrinka BlazingSmith, [place], [place] 03/08/2017 10:47 AM

## 2017-03-08 NOTE — Patient Instructions (Signed)
Will contact Medicaid to see if Pregnancy Medicaid would cover MFM consult for recurrent AB. Out office will ask MFM if they can as well.

## 2017-03-09 LAB — CBC
HEMATOCRIT: 36.3 % (ref 35.0–45.0)
Hemoglobin: 12.5 g/dL (ref 11.7–15.5)
MCH: 30.5 pg (ref 27.0–33.0)
MCHC: 34.4 g/dL (ref 32.0–36.0)
MCV: 88.5 fL (ref 80.0–100.0)
MPV: 11.2 fL (ref 7.5–12.5)
PLATELETS: 261 10*3/uL (ref 140–400)
RBC: 4.1 10*6/uL (ref 3.80–5.10)
RDW: 11.7 % (ref 11.0–15.0)
WBC: 4.4 10*3/uL (ref 3.8–10.8)

## 2017-03-09 LAB — HCG, QUANTITATIVE, PREGNANCY: HCG, Total, QN: 105 m[IU]/mL

## 2017-03-13 ENCOUNTER — Encounter: Payer: Self-pay | Admitting: Advanced Practice Midwife

## 2017-03-13 ENCOUNTER — Telehealth: Payer: Self-pay | Admitting: *Deleted

## 2017-03-13 NOTE — Telephone Encounter (Signed)
Pt notified of elevated Hcg and will repeat in 2 weeks.  Pt aware that we will follow her quants until <5.

## 2017-03-19 DIAGNOSIS — F4310 Post-traumatic stress disorder, unspecified: Secondary | ICD-10-CM

## 2017-03-19 HISTORY — DX: Post-traumatic stress disorder, unspecified: F43.10

## 2017-03-22 ENCOUNTER — Other Ambulatory Visit (INDEPENDENT_AMBULATORY_CARE_PROVIDER_SITE_OTHER): Payer: Medicaid Other

## 2017-03-22 DIAGNOSIS — O021 Missed abortion: Secondary | ICD-10-CM | POA: Diagnosis not present

## 2017-03-22 NOTE — Progress Notes (Addendum)
error 

## 2017-03-23 LAB — HCG, QUANTITATIVE, PREGNANCY: HCG, Total, QN: 3 m[IU]/mL

## 2017-03-25 ENCOUNTER — Telehealth: Payer: Self-pay | Admitting: *Deleted

## 2017-03-25 NOTE — Telephone Encounter (Signed)
Pt notified of BHCG results that were basically neg @ 3

## 2017-04-24 IMAGING — US US MFM FETAL NUCHAL TRANSLUCENCY
1 series · 15 of 28 positions shown · non-contrast
Comparison: none

[Series 1: us mfm fetal nuchal translucency · 15 of 28 slices shown]
[im 1/28]
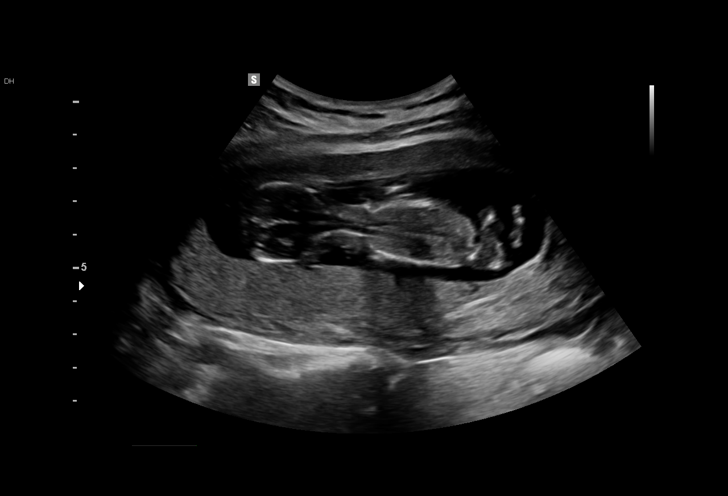
[im 3/28]
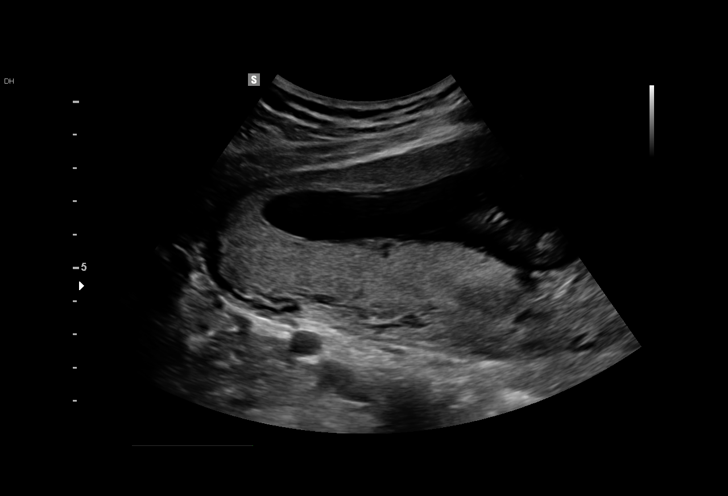
[im 5/28]
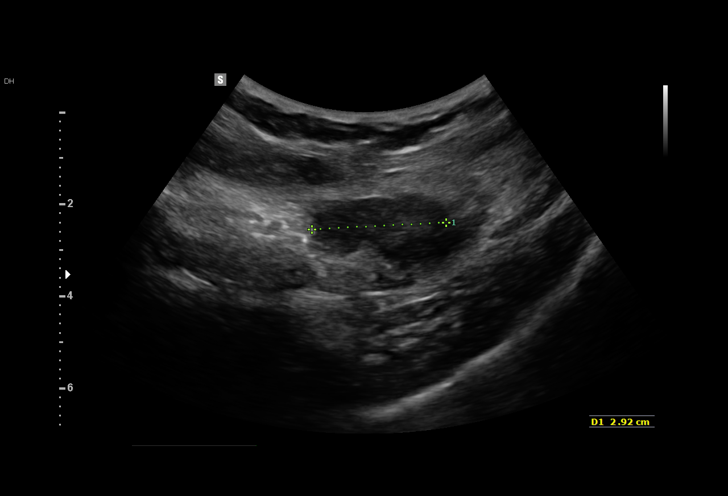
[im 7/28]
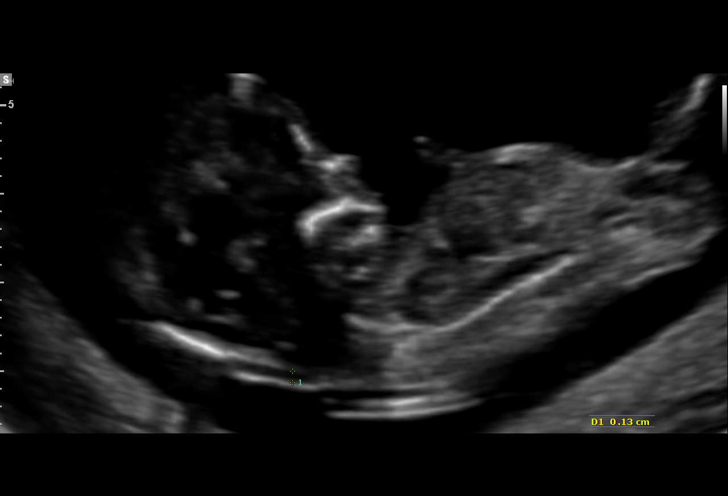
[im 9/28]
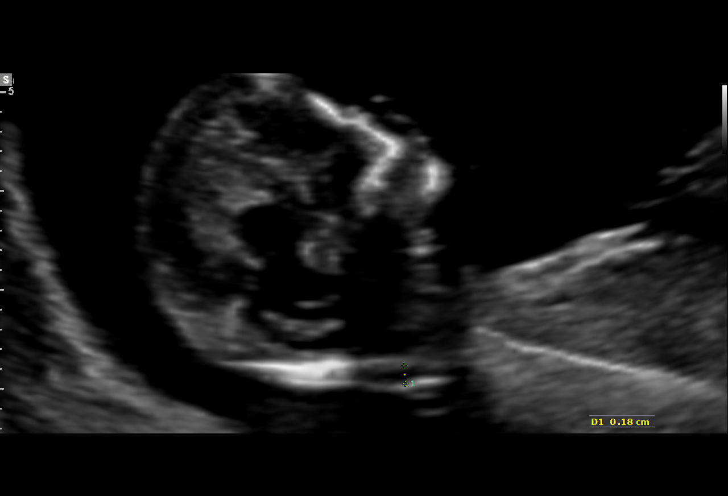
[im 11/28]
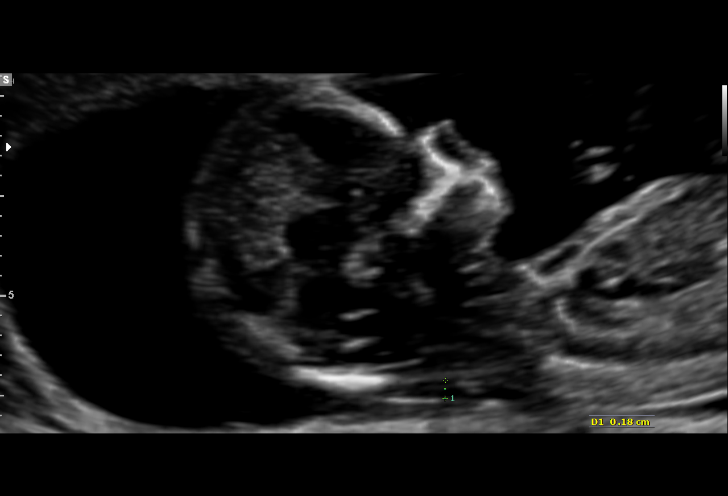
[im 13/28]
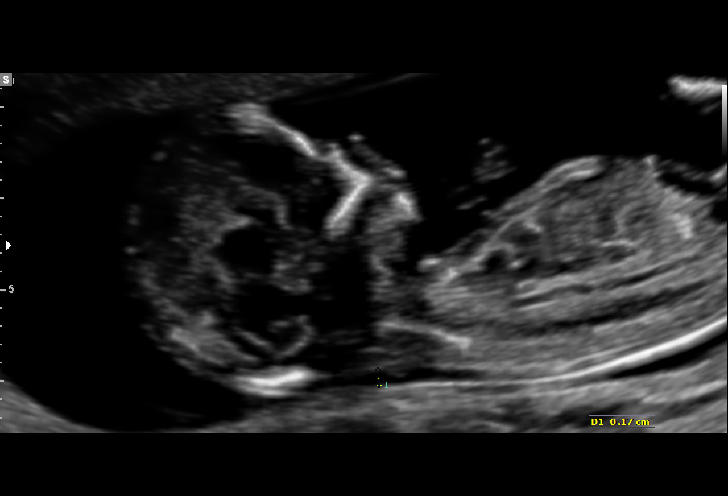
[im 15/28]
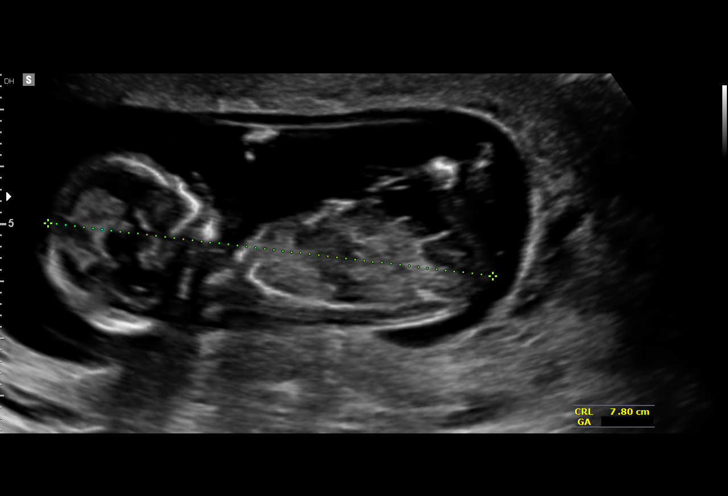
[im 16/28]
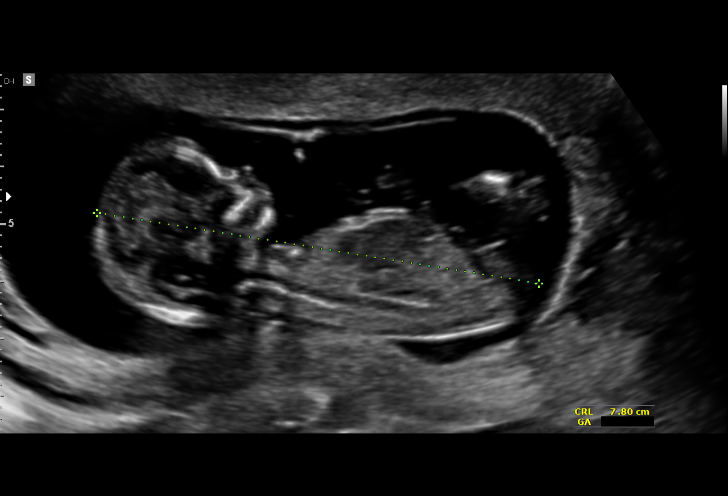
[im 18/28]
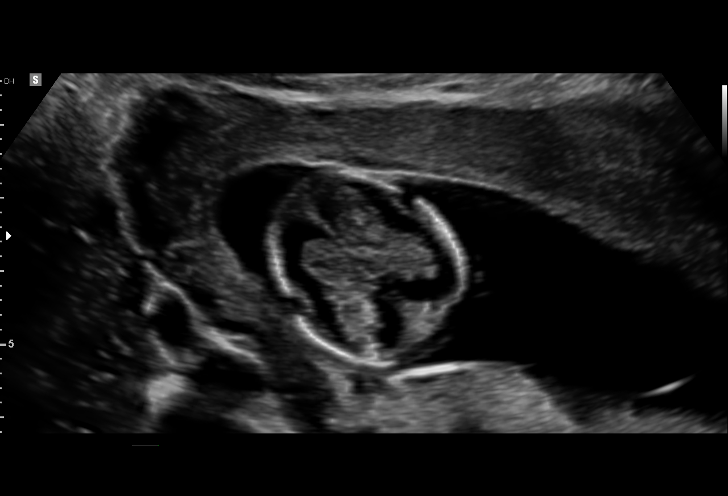
[im 20/28]
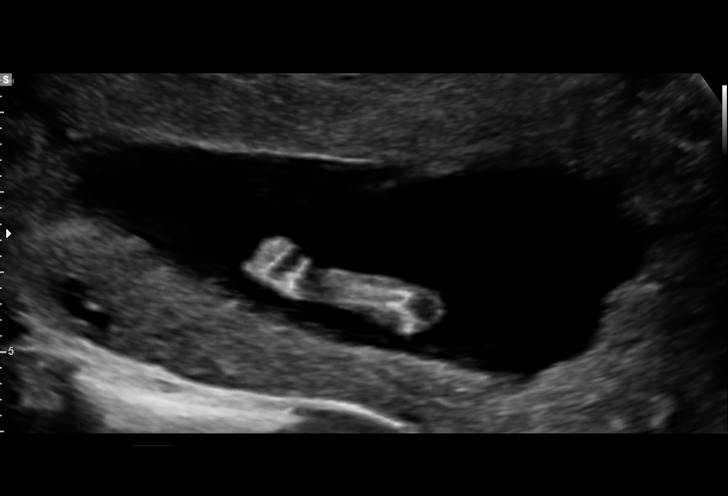
[im 22/28]
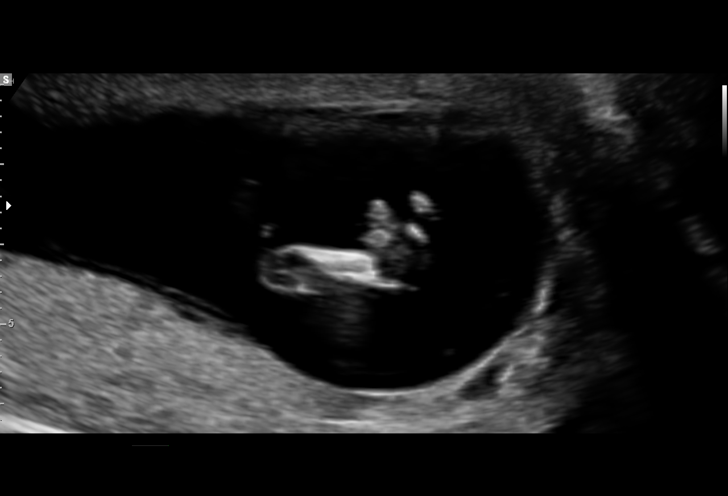
[im 24/28]
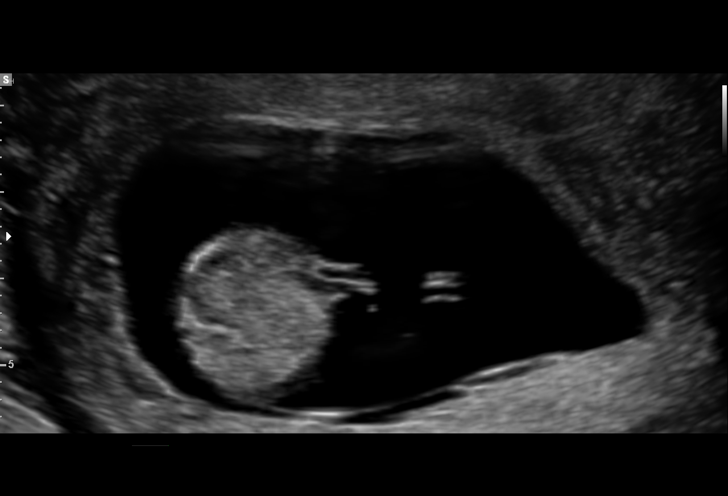
[im 26/28]
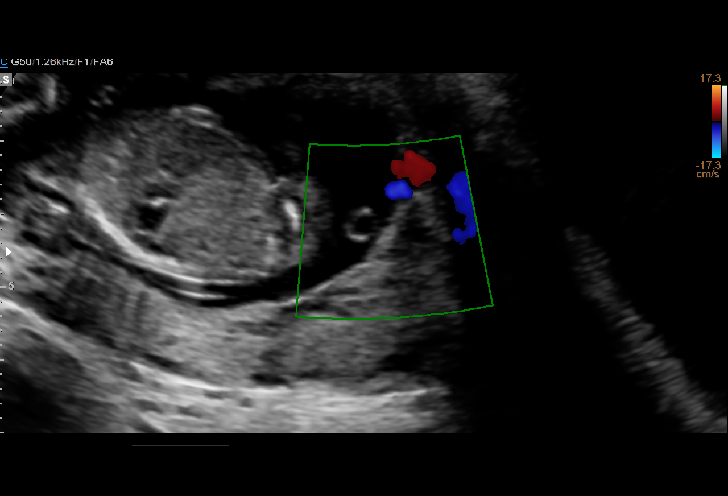
[im 28/28]
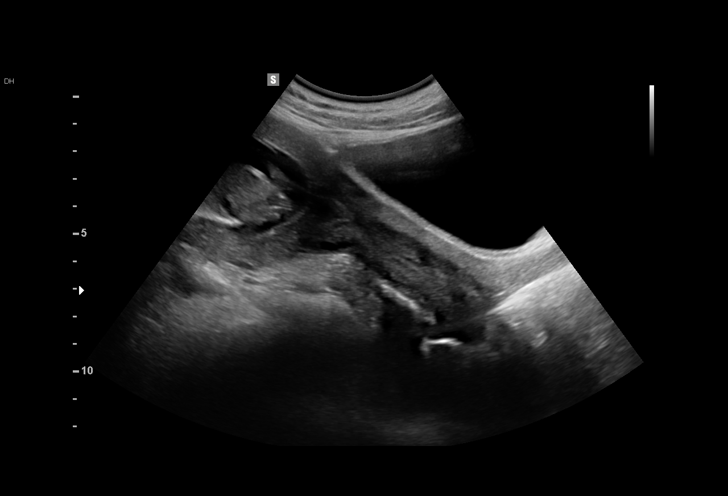

[15 of 28 positions shown; findings below may reference images not displayed]

[REDACTED]-
Faculty Physician

TRANSLUCENCY

1  KRESE INJAC            [id number]      [id number]     [id number]
Indications

12 weeks gestation of pregnancy
First trimester aneuploidy screen (NT)         Z36
OB History

Blood Type:   A+
Gravidity:    3         Term:   1        Prem:   0        SAB:   1
TOP:          0       Ectopic:  0        Living: 1
Fetal Evaluation

Num Of Fetuses:     1
Preg. Location:     Intrauterine
Yolk Sac:           Visualized
Fetal Heart         135
Rate(bpm):
Cardiac Activity:   Observed
Presentation:       Breech
Placenta:           Posterior fundal

Amniotic Fluid
AFI FV:      Subjectively within normal limits
Gestational Age

LMP:           12w 6d       Date:   04/01/15                 EDD:   01/06/16
Best:          12w 6d    Det. By:   LMP  (04/01/15)          EDD:   01/06/16
1st Trimester Genetic Sonogram Screening

CRL:            77.7  mm    G. Age:   13w 4d                 EDD:   01/01/16
Nuc Trans:       1.8  mm
Nasal Bone:                 Present
Anatomy

Cranium:          Appears normal         Abdominal Wall:   Appears nml (cord
insert, abd wall)
Choroid Plexus:   Appears normal         Upper             Visualized
Extremities:
Stomach:          Appears normal, left   Lower             Visualized
sided                  Extremities:
Cervix Uterus Adnexa

Cervix
Normal appearance by transabdominal scan.

Left Ovary
Within normal limits.

Right Ovary
Within normal limits.

Adnexa:       No abnormality visualized.
Impression

SIUP at 12+6 weeks
No gross abnormalities identified
NT measurement was within normal limits for this GA; NB
present
Normal amniotic fluid volume
Measurements consistent with LMP dating
Recommendations

Offer MSAFP in the second trimester for ONTD screening
Offer anatomy U/S by 18 weeks

## 2017-04-25 ENCOUNTER — Encounter: Payer: Self-pay | Admitting: Certified Nurse Midwife

## 2017-04-26 ENCOUNTER — Ambulatory Visit: Payer: Medicaid Other | Admitting: Certified Nurse Midwife

## 2017-05-19 ENCOUNTER — Inpatient Hospital Stay (HOSPITAL_COMMUNITY)
Admission: AD | Admit: 2017-05-19 | Discharge: 2017-05-20 | Disposition: A | Discharge status: Other Acute Inpatient Hospital | Payer: 59 | Source: Ambulatory Visit | Attending: Emergency Medicine | Admitting: Emergency Medicine

## 2017-05-19 ENCOUNTER — Other Ambulatory Visit: Payer: Self-pay

## 2017-05-19 ENCOUNTER — Encounter (HOSPITAL_COMMUNITY): Payer: Self-pay

## 2017-05-19 DIAGNOSIS — F32A Depression, unspecified: Secondary | ICD-10-CM

## 2017-05-19 DIAGNOSIS — R45851 Suicidal ideations: Secondary | ICD-10-CM

## 2017-05-19 DIAGNOSIS — Z0489 Encounter for examination and observation for other specified reasons: Secondary | ICD-10-CM | POA: Insufficient documentation

## 2017-05-19 DIAGNOSIS — Z79899 Other long term (current) drug therapy: Secondary | ICD-10-CM | POA: Diagnosis not present

## 2017-05-19 DIAGNOSIS — F332 Major depressive disorder, recurrent severe without psychotic features: Secondary | ICD-10-CM | POA: Insufficient documentation

## 2017-05-19 DIAGNOSIS — Z87891 Personal history of nicotine dependence: Secondary | ICD-10-CM | POA: Insufficient documentation

## 2017-05-19 DIAGNOSIS — Z008 Encounter for other general examination: Secondary | ICD-10-CM

## 2017-05-19 DIAGNOSIS — R110 Nausea: Secondary | ICD-10-CM | POA: Diagnosis not present

## 2017-05-19 DIAGNOSIS — F329 Major depressive disorder, single episode, unspecified: Secondary | ICD-10-CM

## 2017-05-19 HISTORY — DX: Major depressive disorder, single episode, unspecified: F32.9

## 2017-05-19 HISTORY — DX: Depression, unspecified: F32.A

## 2017-05-19 LAB — URINALYSIS, ROUTINE W REFLEX MICROSCOPIC
Bilirubin Urine: NEGATIVE
Glucose, UA: NEGATIVE mg/dL
Hgb urine dipstick: NEGATIVE
Ketones, ur: NEGATIVE mg/dL
LEUKOCYTES UA: NEGATIVE
NITRITE: NEGATIVE
PH: 6 (ref 5.0–8.0)
Protein, ur: NEGATIVE mg/dL
SPECIFIC GRAVITY, URINE: 1.01 (ref 1.005–1.030)

## 2017-05-19 LAB — COMPREHENSIVE METABOLIC PANEL
ALBUMIN: 4.4 g/dL (ref 3.5–5.0)
ALK PHOS: 39 U/L (ref 38–126)
ALT: 14 U/L (ref 14–54)
AST: 16 U/L (ref 15–41)
Anion gap: 7 (ref 5–15)
BILIRUBIN TOTAL: 0.6 mg/dL (ref 0.3–1.2)
BUN: 10 mg/dL (ref 6–20)
CALCIUM: 9.1 mg/dL (ref 8.9–10.3)
CO2: 27 mmol/L (ref 22–32)
Chloride: 109 mmol/L (ref 101–111)
Creatinine, Ser: 1.05 mg/dL — ABNORMAL HIGH (ref 0.44–1.00)
GFR calc Af Amer: >60 mL/min (ref >60)
GFR calc non Af Amer: >60 mL/min (ref >60)
GLUCOSE: 75 mg/dL (ref 65–99)
Potassium: 4.3 mmol/L (ref 3.5–5.1)
SODIUM: 143 mmol/L (ref 135–145)
TOTAL PROTEIN: 7.3 g/dL (ref 6.5–8.1)

## 2017-05-19 LAB — CBC WITH DIFFERENTIAL/PLATELET
BASOS PCT: 1 %
Basophils Absolute: 0 10*3/uL (ref 0.0–0.1)
EOS ABS: 0.1 10*3/uL (ref 0.0–0.7)
Eosinophils Relative: 1 %
HEMATOCRIT: 37.4 % (ref 36.0–46.0)
HEMOGLOBIN: 12.6 g/dL (ref 12.0–15.0)
LYMPHS ABS: 2.7 10*3/uL (ref 0.7–4.0)
Lymphocytes Relative: 42 %
MCH: 30.4 pg (ref 26.0–34.0)
MCHC: 33.7 g/dL (ref 30.0–36.0)
MCV: 90.1 fL (ref 78.0–100.0)
MONO ABS: 0.6 10*3/uL (ref 0.1–1.0)
MONOS PCT: 9 %
NEUTROS ABS: 3.1 10*3/uL (ref 1.7–7.7)
Neutrophils Relative %: 47 %
Platelets: 257 10*3/uL (ref 150–400)
RBC: 4.15 MIL/uL (ref 3.87–5.11)
RDW: 12.4 % (ref 11.5–15.5)
WBC: 6.5 10*3/uL (ref 4.0–10.5)

## 2017-05-19 LAB — ETHANOL: Alcohol, Ethyl (B): <10 mg/dL (ref <10)

## 2017-05-19 LAB — SALICYLATE LEVEL

## 2017-05-19 LAB — ACETAMINOPHEN LEVEL

## 2017-05-19 LAB — I-STAT BETA HCG BLOOD, ED (MC, WL, AP ONLY): I-stat hCG, quantitative: <5 m[IU]/mL (ref <5)

## 2017-05-19 MED ORDER — ACETAMINOPHEN 325 MG PO TABS
650.0000 mg | ORAL_TABLET | ORAL | Status: DC | PRN
  Administered 2017-05-20: 650 mg via ORAL
  Filled 2017-05-19: qty 2

## 2017-05-19 MED ORDER — ONDANSETRON HCL 4 MG PO TABS
4.0000 mg | ORAL_TABLET | Freq: Three times a day (TID) | ORAL | Status: DC | PRN
  Administered 2017-05-19: 4 mg via ORAL
  Filled 2017-05-19: qty 1

## 2017-05-19 MED ORDER — ALUM & MAG HYDROXIDE-SIMETH 200-200-20 MG/5ML PO SUSP: 30.0000 mL | Freq: Four times a day (QID) | ORAL | Status: DC | PRN

## 2017-05-19 MED ORDER — ZOLPIDEM TARTRATE 5 MG PO TABS
5.0000 mg | ORAL_TABLET | Freq: Every evening | ORAL | Status: DC | PRN
  Administered 2017-05-19: 5 mg via ORAL
  Filled 2017-05-19: qty 1

## 2017-05-19 MED ORDER — ACETAMINOPHEN 500 MG PO TABS
1000.0000 mg | ORAL_TABLET | Freq: Once | ORAL | Status: AC
  Administered 2017-05-19: 1000 mg via ORAL
  Filled 2017-05-19: qty 2

## 2017-05-19 NOTE — B[facility] Counselor (Signed)
Per Rosey BathKelly Southard, RN, [facility]. Bed will be available tomorrow morning, 05/20/17. [facility] morning.

## 2017-05-19 NOTE — ED Notes (Signed)
Bed: [id number] Expected date:  Expected time:  Means of arrival:  Comments[facility]

## 2017-05-19 NOTE — MAU Provider Note (Signed)
Chief Complaint: Dizziness (emotionally disturbed )   First Provider Initiated Contact with Patient 05/19/17 1541      SUBJECTIVE HPI: Laura [name] is a 22 y.o. [id number] who presents to maternity admissions reporting sadness and depression since her miscarriage 3 months ago, and recent thoughts of harming herself. She reports that she does not plan to follow through with her thoughts but these thoughts scare her.  She had some postpartum depression after her first child but it resolved and did not need any treatment. She has no other hx of anxiety depression.  She has good support and her s/o is present today with her in MAU. She just thinks this is getting the best of her and since it started after her miscarriage, she came to MAU to be evaluated. She denies any abdominal pain, vaginal bleeding, or other gyn concerns today.  She has not tried any treatments and there are no other associated symptoms.  HPI  Past Medical History:  Diagnosis Date  . Depression    since 12/18 from MAB   Past Surgical History:  Procedure Laterality Date  . APPENDECTOMY    . CESAREAN SECTION N/A 12/30/2015   Procedure: CESAREAN SECTION;  Surgeon: Adam Phenix, MD;  Location: [facility] BIRTHING SUITES;  Service: Obstetrics;  Laterality: N/A;  . CHOLECYSTECTOMY     Social History   Socioeconomic History  . Marital status: Single    Spouse name: Not on file  . Number of children: Not on file  . Years of education: Not on file  . Highest education level: Not on file  Social Needs  . Financial resource strain: Not on file  . Food insecurity - worry: Not on file  . Food insecurity - inability: Not on file  . Transportation needs - medical: Not on file  . Transportation needs - non-medical: Not on file  Occupational History  . Not on file  Tobacco Use  . Smoking status: Former Games developer  . Smokeless tobacco: Former Engineer, water and Sexual Activity  . Alcohol use: No  . Drug use: No  . Sexual  activity: Yes    Birth control/protection: None  Other Topics Concern  . Not on file  Social History Narrative  . Not on file   No current facility-administered medications on file prior to encounter.    Current Outpatient Medications on File Prior to Encounter  Medication Sig Dispense Refill  . ibuprofen (ADVIL,MOTRIN) 600 MG tablet Take 1 tablet (600 mg total) by mouth every 6 (six) hours as needed. 30 tablet 0  . promethazine (PHENERGAN) 25 MG tablet Take 1 tablet (25 mg total) by mouth every 6 (six) hours as needed for nausea or vomiting. 30 tablet 0   Allergies  Allergen Reactions  . Divalproex Sodium Other (See Comments)    Reaction:  Memory loss   . Demerol [Meperidine] Hives    ROS:  Review of Systems  Constitutional: Negative for chills, fatigue and fever.  Respiratory: Negative for shortness of breath.   Cardiovascular: Negative for chest pain.  Genitourinary: Negative for difficulty urinating, dysuria, flank pain, pelvic pain, vaginal bleeding, vaginal discharge and vaginal pain.  Neurological: Negative for dizziness and headaches.  Psychiatric/Behavioral: Positive for suicidal ideas.     I have reviewed patient's Past Medical Hx, Surgical Hx, Family Hx, Social Hx, medications and allergies.   Physical Exam  No data found. Constitutional: Well-developed, well-nourished female in mild distress.  Cardiovascular: normal rate Respiratory: normal effort GI: Abd soft, non-tender. Pos  BS x 4 MS: Extremities nontender, no edema, normal ROM Neurologic: Alert and oriented x 4.  GU: Neg CVAT.   LAB RESULTS Results for orders placed or performed during the hospital encounter of 05/19/17 (from the past 24 hour(s))  Urinalysis, Routine w reflex microscopic     Status: Abnormal   Collection Time: 05/19/17  2:20 PM  Result Value Ref Range   Color, Urine STRAW (A) YELLOW   APPearance CLEAR CLEAR   Specific Gravity, Urine 1.010 1.005 - 1.030   pH 6.0 5.0 - 8.0    Glucose, UA NEGATIVE NEGATIVE mg/dL   Hgb urine dipstick NEGATIVE NEGATIVE   Bilirubin Urine NEGATIVE NEGATIVE   Ketones, ur NEGATIVE NEGATIVE mg/dL   Protein, ur NEGATIVE NEGATIVE mg/dL   Nitrite NEGATIVE NEGATIVE   Leukocytes, UA NEGATIVE NEGATIVE       IMAGING No results found.  MAU Management/MDM: Pt with depression and thoughts of suicide today.  Consult telepsych for further evaluation.  Pt with sitter in room at all times in MAU.   Telepsych eval completed and recommendation to admit pt for inpatient treatment.  Pt transferred to [facility] for behavioral health holding bed until placement available.  Pt stable at time of transfer.     ASSESSMENT Depression Thoughts of suicide S/P spontaneous abortion 02/2017  PLAN Inpatient admission for psych Transfer to [facility] behavioral health for holding   Sharen Counter Certified Nurse-Midwife 05/19/2017  3:44 PM
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
## 2017-05-19 NOTE — ED Notes (Signed)
Bed: [id number] Expected date:  Expected time:  Means of arrival:  Comments: Women's tx

## 2017-05-19 NOTE — ED Provider Notes (Signed)
**Note De-Identified Laura Obfuscation** [facility] DEPT Provider Note   CSN: [id number] Arrival date & time: 05/19/17  1414     History   Chief Complaint Chief Complaint  Patient presents with  . Depression    emotionally disturbed     HPI Laura [name] is a 22 y.o. female with a PMHx of depression, who presents to the ED [facility] with complaints of worsening depression x3 months and now having SI without a plan.  Per chart review, pt was seen at [facility]U today for depression since her miscarriage 3 months ago, and recently began having thoughts of harming herself.  She was evaluated by TTS there and inpatient placement was recommended, apparently she'll have a room available at [facility] tomorrow morning.  She had a U/A done there which was unremarkable, but otherwise no other labs were done.  She was transferred here for psych monitoring until tomorrow when her bed is available.  She confirms that the above information is accurate, reports SI without a plan.  Denies HI, AVH, illicit drug use, alcohol use, or tobacco use.  She has mild nausea but otherwise has no medical complaints at this time.  LMP was 04/24/17.  She is here voluntarily.  She was only recently prescribed Phenergan after her miscarriage, but historically she's liked zofran more for when she has nausea.  She otherwise has no prescribed medications she takes at home.  She has never seen a therapist, and has never been on any depression/psych medications.    The history is provided by the patient and medical records. No language interpreter was used.  Depression  This is a recurrent problem. The current episode started more than 1 week ago. The problem occurs constantly. The problem has been gradually worsening. Pertinent negatives include no chest pain, no abdominal pain and no shortness of breath. Exacerbated by: recent miscarriage. Nothing relieves the symptoms. She has tried nothing for the symptoms. The treatment provided no relief.     Past Medical History:  Diagnosis Date  . Depression    since 12/18 from [facility]    Patient Active Problem List   Diagnosis Date Noted  . S/P cesarean section 01/01/2016    Past Surgical History:  Procedure Laterality Date  . APPENDECTOMY    . CESAREAN SECTION N/A 12/30/2015   Procedure: CESAREAN SECTION;  Surgeon: Woodroe Mode, MD;  Location: [facility];  Service: Obstetrics;  Laterality: N/A;  . CHOLECYSTECTOMY      OB History    Gravida Para Term Preterm AB Living   '4 2 2   2 2   ' SAB TAB Ectopic Multiple Live Births   2     0 2       Home Medications    Prior to Admission medications   Medication Sig Start Date End Date Taking? Authorizing Provider  ibuprofen (ADVIL,MOTRIN) 600 MG tablet Take 1 tablet (600 mg total) by mouth every 6 (six) hours as needed. 02/22/17  Yes Julianne Handler, CNM  promethazine (PHENERGAN) 25 MG tablet Take 1 tablet (25 mg total) by mouth every 6 (six) hours as needed for nausea or vomiting. 02/22/17  Yes Julianne Handler, CNM    Family History Family History  Problem Relation Age of Onset  . Cancer Maternal Aunt   . Cancer Maternal Uncle   . Diabetes Paternal Aunt   . Diabetes Paternal Uncle   . Diabetes Maternal Grandmother   . Cancer Maternal Grandfather   . Cancer Paternal Grandmother   .  Diabetes Paternal Grandmother   . Diabetes Paternal Grandfather     Social History Social History   Tobacco Use  . Smoking status: Former Research scientist (life sciences)  . Smokeless tobacco: Former Network engineer Use Topics  . Alcohol use: No  . Drug use: No     Allergies   Divalproex sodium and Demerol [meperidine]   Review of Systems Review of Systems  Constitutional: Negative for chills and fever.  Respiratory: Negative for shortness of breath.   Cardiovascular: Negative for chest pain.  Gastrointestinal: Positive for nausea. Negative for abdominal pain, constipation, diarrhea and vomiting.  Genitourinary: Negative for dysuria and  hematuria.  Musculoskeletal: Negative for arthralgias and myalgias.  Skin: Negative for color change.  Allergic/Immunologic: Negative for immunocompromised state.  Neurological: Negative for weakness and numbness.  Psychiatric/Behavioral: Positive for depression and suicidal ideas. Negative for confusion and hallucinations.   All other systems reviewed and are negative for acute change except as noted in the HPI.    Physical Exam Updated Vital Signs BP 112/78 (BP Location: Right Arm)   Pulse 66   Temp 98.2 F (36.8 C)   Resp 20   Ht '5\' 7"'  (1.702 m)   Wt 67.6 kg (149 lb)   SpO2 100%   BMI 23.34 kg/m    Physical Exam  Constitutional: She is oriented to person, place, and time. Vital signs are normal. She appears well-developed and well-nourished.  Non-toxic appearance. No distress.  Afebrile, nontoxic, NAD  HENT:  Head: Normocephalic and atraumatic.  Mouth/Throat: Oropharynx is clear and moist and mucous membranes are normal.  Eyes: Conjunctivae and EOM are normal. Right eye exhibits no discharge. Left eye exhibits no discharge.  Neck: Normal range of motion. Neck supple.  Cardiovascular: Normal rate, regular rhythm, normal heart sounds and intact distal pulses. Exam reveals no gallop and no friction rub.  No murmur heard. Pulmonary/Chest: Effort normal and breath sounds normal. No respiratory distress. She has no decreased breath sounds. She has no wheezes. She has no rhonchi. She has no rales.  Abdominal: Soft. Normal appearance and bowel sounds are normal. She exhibits no distension. There is no tenderness. There is no rigidity, no rebound, no guarding, no CVA tenderness, no tenderness at McBurney's point and negative Murphy's sign.  Soft, NTND, +BS throughout, no r/g/r, neg murphy's, neg mcburney's, no CVA TTP   Musculoskeletal: Normal range of motion.  Neurological: She is alert and oriented to person, place, and time. She has normal strength. No sensory deficit.  Skin: Skin  is warm, dry and intact. No rash noted.  Psychiatric: She is not actively hallucinating. She exhibits a depressed mood. She expresses suicidal ideation. She expresses no homicidal ideation. She expresses no suicidal plans and no homicidal plans.  Depressed affect, somewhat tearful, but pleasant and cooperative. Endorsing SI without a plan, denies HI or AVH, doesn't seem to be responding to internal stimuli.   Nursing note and vitals reviewed.    ED Treatments / Results  Labs (all labs ordered are listed, but only abnormal results are displayed) Labs Reviewed  URINALYSIS, ROUTINE W REFLEX MICROSCOPIC - Abnormal; Notable for the following components:      Result Value   Color, Urine STRAW (*)    All other components within normal limits  COMPREHENSIVE METABOLIC PANEL - Abnormal; Notable for the following components:   Creatinine, Ser 1.05 (*)    All other components within normal limits  ACETAMINOPHEN LEVEL - Abnormal; Notable for the following components:   Acetaminophen (Tylenol), Serum <10 (*)  All other components within normal limits  ETHANOL  CBC WITH DIFFERENTIAL/PLATELET  SALICYLATE LEVEL  RAPID URINE DRUG SCREEN, HOSP PERFORMED  I-STAT BETA HCG BLOOD, ED ([facility], [facility], [facility] ONLY)    EKG  EKG Interpretation None       Radiology No results found.  Procedures Procedures (including critical care time)  Medications Ordered in ED Medications  acetaminophen (TYLENOL) tablet 650 mg (not administered)  zolpidem (AMBIEN) tablet 5 mg (not administered)  ondansetron (ZOFRAN) tablet 4 mg (4 mg Oral Given 05/19/17 2014)  alum & mag hydroxide-simeth (MAALOX/MYLANTA) 200-200-20 MG/5ML suspension 30 mL (not administered)  acetaminophen (TYLENOL) tablet 1,000 mg (1,000 mg Oral Given 05/19/17 1627)     Initial Impression / Assessment and Plan / ED Course  I have reviewed the triage vital signs and the nursing notes.  Pertinent labs & imaging results that were available during my care  of the patient were reviewed by me and considered in my medical decision making (see chart for details).     22 y.o. female here as a transfer from the [facility]U, she went there due to worsening depression and SI since her miscarriage several months ago. Had U/A done there which was unremarkable, TTS consulted on her there and felt she met inpatient criteria, apparently has a bed at [facility] tomorrow morning. Was transferred here to wait for her bed. She reports SI without a plan, denies HI/AVH/drug use/EtOH use and is a nonsmoker. Reports some mild nausea but otherwise has no medical complaints at this time. She is here voluntarily. On exam, no abdominal tenderness, tearful but very pleasant and cooperative, depressed affect. Will get psych clearance labs and reassess shortly.   9:47 PM CBC w/diff unremarkable. CMP with marginally elevated Cr 1.05 but otherwise unremarkable. EtOH level undetectable. Salicylate and acetaminophen levels WNL. BetaHCG negative. UDS pending, but does not interfere with med clearance. Pt medically cleared at this time. Psych hold orders and home med orders placed. Please see TTS/[facility] notes for further documentation of care/dispo. PLEASE NOTE THAT PT IS HERE VOLUNTARILY AT THIS TIME, IF PT TRIES TO LEAVE THEY WOULD NEED IVC PAPERWORK TAKEN OUT. Pt stable at time of med clearance.     Final Clinical Impressions(s) / ED Diagnoses   Final diagnoses:  Suicidal ideation  Depression, unspecified depression type  Nausea  Medical clearance for psychiatric admission    ED Discharge Orders    [address] [place], [place] 05/19/17 2147    Drenda Freeze, MD 05/19/17 [phone]

## 2017-05-19 NOTE — ED Notes (Addendum)
SBAR Report received from previous nurse. Pt receivedvisibly upset and tearful to unit. Pt denies current SI/ HI, A/V H,  or pain at this time. Pt endorses depression 10/10, anxiety 10/10  and appears otherwise stable but tearful. Pt visiting with family at this time.l. Pt reminded of camera surveillance, q 15 min rounds, and rules of the milieu. Will continue to assess.

## 2017-05-19 NOTE — Progress Notes (Signed)
Chaplain paged to support Pt. who miscarried back in Dec 2018. RN reported that the Pt expressed thoughts of depression. Pt said, "I cannot get pass the loss of my last Miscarriage." When chaplain entered to room, Pt was with husband. Husband seems supportive and step out so that the Pt could speak freely. Pt was very tearful and expression feeling for hopelessness. She retold the events of her loss and expressed her fears for the future. Chaplain listened and provided some grief counseling.
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
## 2017-05-19 NOTE — Progress Notes (Signed)
telepsych in progress 

## 2017-05-19 NOTE — MAU Note (Signed)
Per Darl P[facility]Susan in [facility] approved patient to go to bed #42, report given to Estella HuskAshley Strador. [facility] transport called.

## 2017-05-20 ENCOUNTER — Inpatient Hospital Stay (HOSPITAL_COMMUNITY)
Admission: AD | Admit: 2017-05-20 | Discharge: 2017-05-24 | DRG: 885 | Disposition: A | Discharge status: Home / Self Care | Payer: Medicaid Other | Source: Intra-hospital | Attending: Psychiatry | Admitting: Psychiatry

## 2017-05-20 ENCOUNTER — Encounter (HOSPITAL_COMMUNITY): Payer: Self-pay | Admitting: *Deleted

## 2017-05-20 ENCOUNTER — Other Ambulatory Visit: Payer: Self-pay

## 2017-05-20 DIAGNOSIS — F4310 Post-traumatic stress disorder, unspecified: Secondary | ICD-10-CM | POA: Diagnosis present

## 2017-05-20 DIAGNOSIS — F332 Major depressive disorder, recurrent severe without psychotic features: Secondary | ICD-10-CM | POA: Diagnosis not present

## 2017-05-20 DIAGNOSIS — Z888 Allergy status to other drugs, medicaments and biological substances status: Secondary | ICD-10-CM | POA: Diagnosis not present

## 2017-05-20 DIAGNOSIS — Z87891 Personal history of nicotine dependence: Secondary | ICD-10-CM | POA: Diagnosis not present

## 2017-05-20 DIAGNOSIS — R45851 Suicidal ideations: Secondary | ICD-10-CM | POA: Diagnosis present

## 2017-05-20 DIAGNOSIS — R51 Headache: Secondary | ICD-10-CM | POA: Diagnosis present

## 2017-05-20 DIAGNOSIS — Z811 Family history of alcohol abuse and dependence: Secondary | ICD-10-CM | POA: Diagnosis not present

## 2017-05-20 DIAGNOSIS — Z8659 Personal history of other mental and behavioral disorders: Secondary | ICD-10-CM

## 2017-05-20 DIAGNOSIS — R450 Nervousness: Secondary | ICD-10-CM | POA: Diagnosis not present

## 2017-05-20 DIAGNOSIS — Z818 Family history of other mental and behavioral disorders: Secondary | ICD-10-CM

## 2017-05-20 DIAGNOSIS — F322 Major depressive disorder, single episode, severe without psychotic features: Secondary | ICD-10-CM | POA: Diagnosis not present

## 2017-05-20 DIAGNOSIS — K30 Functional dyspepsia: Secondary | ICD-10-CM | POA: Diagnosis present

## 2017-05-20 DIAGNOSIS — F53 Postpartum depression: Secondary | ICD-10-CM | POA: Diagnosis present

## 2017-05-20 DIAGNOSIS — F410 Panic disorder [episodic paroxysmal anxiety] without agoraphobia: Secondary | ICD-10-CM | POA: Diagnosis present

## 2017-05-20 DIAGNOSIS — F419 Anxiety disorder, unspecified: Secondary | ICD-10-CM | POA: Diagnosis not present

## 2017-05-20 DIAGNOSIS — Z885 Allergy status to narcotic agent status: Secondary | ICD-10-CM

## 2017-05-20 DIAGNOSIS — G4700 Insomnia, unspecified: Secondary | ICD-10-CM | POA: Diagnosis not present

## 2017-05-20 DIAGNOSIS — K219 Gastro-esophageal reflux disease without esophagitis: Secondary | ICD-10-CM | POA: Diagnosis not present

## 2017-05-20 LAB — RAPID URINE DRUG SCREEN, HOSP PERFORMED
Amphetamines: NOT DETECTED
Barbiturates: NOT DETECTED
Benzodiazepines: NOT DETECTED
Cocaine: NOT DETECTED
OPIATES: NOT DETECTED
TETRAHYDROCANNABINOL: NOT DETECTED

## 2017-05-20 MED ORDER — ONDANSETRON 4 MG PO TBDP
4.0000 mg | ORAL_TABLET | Freq: Three times a day (TID) | ORAL | Status: DC | PRN
  Administered 2017-05-20 – 2017-05-23 (×5): 4 mg via ORAL
  Filled 2017-05-20 (×5): qty 1

## 2017-05-20 MED ORDER — MAGNESIUM HYDROXIDE 400 MG/5ML PO SUSP: 30.0000 mL | Freq: Every day | ORAL | Status: DC | PRN

## 2017-05-20 MED ORDER — ACETAMINOPHEN 325 MG PO TABS
650.0000 mg | ORAL_TABLET | Freq: Four times a day (QID) | ORAL | Status: DC | PRN
  Administered 2017-05-21 – 2017-05-23 (×3): 650 mg via ORAL
  Filled 2017-05-20 (×3): qty 2

## 2017-05-20 MED ORDER — HYDROXYZINE HCL 25 MG PO TABS
25.0000 mg | ORAL_TABLET | Freq: Three times a day (TID) | ORAL | Status: DC | PRN
  Administered 2017-05-21 – 2017-05-24 (×7): 25 mg via ORAL
  Filled 2017-05-20 (×7): qty 1

## 2017-05-20 MED ORDER — TRAZODONE HCL 50 MG PO TABS
50.0000 mg | ORAL_TABLET | Freq: Every evening | ORAL | Status: DC | PRN
  Administered 2017-05-20 – 2017-05-23 (×4): 50 mg via ORAL
  Filled 2017-05-20 (×4): qty 1

## 2017-05-20 MED ORDER — ALUM & MAG HYDROXIDE-SIMETH 200-200-20 MG/5ML PO SUSP
30.0000 mL | ORAL | Status: DC | PRN
  Administered 2017-05-22: 30 mL via ORAL
  Filled 2017-05-20: qty 30

## 2017-05-20 NOTE — Plan of Care (Signed)
  Progressing Safety: Periods of time without injury will increase 05/20/2017 2231 - Progressing by Arrie Aranhurch, Jsaon Yoo J, RN Note Pt has not harmed self or others tonight.  She denies SI/HI and verbally contracts for safety.

## 2017-05-20 NOTE — Progress Notes (Signed)
Adult Psychoeducational Group Note  Date:  05/20/2017 Time:  9:19 PM  Group Topic/Focus:  Wrap-Up Group:   The focus of this group is to help patients review their daily goal of treatment and discuss progress on daily workbooks.  Participation Level:  Active  Participation Quality:  Appropriate  Affect:  Appropriate  Cognitive:  Appropriate  Insight: Appropriate  Engagement in Group:  Engaged  Modes of Intervention:  Discussion  Additional Comments: Just arived  Octavio Mannshigpen, Aahan Marques Lee 05/20/2017, 9:19 PM

## 2017-05-20 NOTE — Tx Team (Signed)
Initial Treatment Plan 05/20/2017 5:43 PM Laura Sheffieldebecca Rozier WUJ:[id number]    PATIENT STRESSORS: Marital or family conflict Occupational concerns Other: recent miscarriage   PATIENT STRENGTHS: Ability for insight Average or above average intelligence Capable of independent living Communication skills General fund of knowledge Motivation for treatment/growth Supportive family/friends   PATIENT IDENTIFIED PROBLEMS: Depression Anxiety Suicidal thoughts "get my happy back and have no more thoughts of depression"                     DISCHARGE CRITERIA:  Ability to meet basic life and health needs Improved stabilization in mood, thinking, and/or behavior Verbal commitment to aftercare and medication compliance  PRELIMINARY DISCHARGE PLAN: Attend aftercare/continuing care group Return to previous living arrangement  PATIENT/FAMILY INVOLVEMENT: This treatment plan has been presented to and reviewed with the patient, Laura [name], and/or family member, .  The patient and family have been given the opportunity to ask questions and make suggestions.  Jamar Weatherall, [place], [place]RN 05/20/2017, 5:43 PM

## 2017-05-20 NOTE — Progress Notes (Signed)
D: Pt was in the hallway upon initial approach.  Pt presents with anxious affect and mood.  She reports her day was "okay" and her goal is "not keeping to myself, talking, getting to know other people here."  Pt denies SI/HI, denies hallucinations, complains of abdominal cramps of 4/10.  Pt has been visible in milieu interacting with peers and staff appropriately.  Pt attended evening group.    A: Introduced self to pt.  Actively listened to pt and offered support and encouragement.   PRN medication administered for sleep and nausea.  Heat packs provided for pain.  Q15 minute safety checks maintained.  R: Pt is safe on the unit.  Pt is compliant with medications.  Pt verbally contracts for safety.  Will continue to monitor and assess.

## 2017-05-20 NOTE — BH Assessment (Signed)
[facility]  Per Juanetta BeetsJacqueline Norman, DO, this pt requires psychiatric hospitalization at this time.  Malva LimesLinsey Strader, RN, [id number]; she will [facility].  Pt has signed Voluntary Admission and Consent for Treatment, as well as Consent to Release Information to pt's mother and her boyfriend, and signed forms have [facility].  Pt's nurse, Morrie Sheldonshley, has been notified, and agrees to send original paperwork along with pt via [facility] [place], and to call report to [phone].  Doylene Canninghomas Omid Deardorff, [place] Behavioral Health Coordinator [phone]

## 2017-05-20 NOTE — ED Notes (Signed)
Pt transported to [facility] by [facility]. All belongings returned to pt who signed for same. Pt was calm and cooperative.  

## 2017-05-20 NOTE — Progress Notes (Signed)
Laura [name] is a 22 year old female pt admitted on voluntary basis. On admission, Laura [name] presents as anxious and fidgety. She endorses on-going depression and does endorse passive SI but is able to contract for safety while in the hospital. She reports she does not have a PCP and reports she does not have any medical issues. She denies any substance abuse issues and reports that she is not on any medications. She reports that she lives with her mother, boyfriend, and 2 children and reports that she will go back there once she is discharged. Laura [name] was oriented to the unit and safety maintained.

## 2017-05-21 DIAGNOSIS — Z811 Family history of alcohol abuse and dependence: Secondary | ICD-10-CM

## 2017-05-21 DIAGNOSIS — F322 Major depressive disorder, single episode, severe without psychotic features: Principal | ICD-10-CM

## 2017-05-21 DIAGNOSIS — R450 Nervousness: Secondary | ICD-10-CM

## 2017-05-21 DIAGNOSIS — R51 Headache: Secondary | ICD-10-CM

## 2017-05-21 DIAGNOSIS — Z818 Family history of other mental and behavioral disorders: Secondary | ICD-10-CM

## 2017-05-21 DIAGNOSIS — F419 Anxiety disorder, unspecified: Secondary | ICD-10-CM

## 2017-05-21 MED ORDER — CITALOPRAM HYDROBROMIDE 10 MG PO TABS
10.0000 mg | ORAL_TABLET | Freq: Every day | ORAL | Status: DC
  Administered 2017-05-21 – 2017-05-24 (×4): 10 mg via ORAL
  Filled 2017-05-21 (×6): qty 1
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
## 2017-05-21 NOTE — Progress Notes (Signed)
D    Pt is pleasant on approach   She is visible on the milieu and interacts well with staff and peers   Her behavior is appropriate   She continues to endorse anxiety and depression but reports improvement since coming here   Pt denies nausea at this time and said her medication has helped her  A    Verbal support given    Medications administered and effectiveness monitored   Q 15 min checks R   Pt is safe at present time and receptive to verbal support 

## 2017-05-21 NOTE — Progress Notes (Signed)
D: Patient observed up and somewhat restless on unit today. Has become more comfortable as day has progressed. Observed playing cards with peers. Patient states "I am anxious and a little overstimulated being here. I am also nauseated. This has been an ongoing problem and they don't know why." Patient's affect anxious with congruent mood. Per self inventory and discussions with writer, rates depression at a 6/10, hopelessness at a 5/10 and anxiety at a 7/10. Rates sleep as good, appetite as fair, energy as low and concentration as poor.  States goal for today is "being more vocal, more involved. Stay included. Go to groups."  Complaining of menstrual cramps of a 6/10.    A: Medicated per orders, prn tylenol and vistaril given for complaints. Level III obs in place for safety. Emotional support offered and self inventory reviewed. Encouraged completion of Suicide Safety Plan and programming participation. Discussed POC with MD, SW.    R: Patient verbalizes understanding of POC. On reassess, patient reports decreased anxiety and pain at a 2/10. Patient denies SI/HI/AVH and remains safe on level III obs. Will continue to monitor closely and make verbal contact frequently.

## 2017-05-21 NOTE — [facility] Group Notes (Signed)
[facility] Group Notes:  (Nursing/MHT/Case Management/Adjunct)  Date:  05/21/2017  Time:  1515  Type of Therapy:  Nurse Education  - Therapeutic Activity  Participation Level:  Active  Participation Quality:  Attentive  Affect:  Appropriate  Cognitive:  Alert  Insight:  Improving  Engagement in Group:  Engaged  Modes of Intervention:  Activity  Summary of Progress/Problems: Patient attended group and participated in therapeutic ball activity. Shared with peers, brightened in affect and mood.   Lawrence MarseillesFriedman, Any Mcneice Eakes 05/21/2017, 5:10 PM

## 2017-05-21 NOTE — [facility] Group Notes (Signed)
LCSW Group Therapy Note 05/21/2017 12:29 PM  Type of Therapy and Topic: Group Therapy: Avoiding Self-Sabotaging and Enabling Behaviors  Participation Level: Active  Description of Group:  In this group, patients will learn how to identify obstacles, self-sabotaging and enabling behaviors, as well as: what are they, why do we do them and what needs these behaviors meet. Discuss unhealthy relationships and how to have positive healthy boundaries with those that sabotage and enable. Explore aspects of self-sabotage and enabling in yourself and how to limit these self-destructive behaviors in everyday life.  Therapeutic Goals: 1. Patient will identify one obstacle that relates to self-sabotage and enabling behaviors 2. Patient will identify one personal self-sabotaging or enabling behavior they did prior to admission 3. Patient will state a plan to change the above identified behavior 4. Patient will demonstrate ability to communicate their needs through discussion and/or role play.   Summary of Patient Progress:  Laura [name] was engaged and participated throughout the group session. Laura [name] states that self sabotaging behavior is "when you do anything that causes harm to yourself". Laura [name] reports that her self sabotaging behavior is suppressing her feelings and thoughts. Laura [name] states that she would like to have a therapist at discharge so that she can have that support for venting and expressing her feelings in a safe space.     Therapeutic Modalities:  Cognitive Behavioral Therapy Person-Centered Therapy Motivational Interviewing   Baldo DaubJolan Ajna Moors LCSWA Clinical Social Worker

## 2017-05-21 NOTE — Plan of Care (Signed)
Patient verbalizes understanding of information, education provided. 

## 2017-05-22 DIAGNOSIS — Z87891 Personal history of nicotine dependence: Secondary | ICD-10-CM

## 2017-05-22 DIAGNOSIS — G4700 Insomnia, unspecified: Secondary | ICD-10-CM

## 2017-05-22 DIAGNOSIS — F332 Major depressive disorder, recurrent severe without psychotic features: Secondary | ICD-10-CM

## 2017-05-22 DIAGNOSIS — K30 Functional dyspepsia: Secondary | ICD-10-CM | POA: Diagnosis present

## 2017-05-22 LAB — TSH: TSH: 4.791 u[IU]/mL — ABNORMAL HIGH (ref 0.350–4.500)

## 2017-05-22 MED ORDER — FAMOTIDINE 20 MG PO TABS
20.0000 mg | ORAL_TABLET | Freq: Two times a day (BID) | ORAL | Status: DC
  Administered 2017-05-22 – 2017-05-24 (×4): 20 mg via ORAL
  Filled 2017-05-22 (×7): qty 1

## 2017-05-22 NOTE — Progress Notes (Signed)
Adult Psychoeducational Group Note  Date:  05/22/2017 Time:  2:06 AM  Group Topic/Focus:  Wrap-Up Group:   The focus of this group is to help patients review their daily goal of treatment and discuss progress on daily workbooks.  Participation Level:  Active  Participation Quality:  Appropriate and Attentive  Affect:  Appropriate  Cognitive:  Alert and Appropriate  Insight: Appropriate  Engagement in Group:  Engaged  Modes of Intervention:  Discussion, Education, Socialization and Support  Additional Comments:  Pt rated her day at a 7 out of 10. Pt stated her goal was to not shelter herself in her room and to participate in activities throughout the day. Pt shared that she attended all groups today.  Malachy MoanJeffers, Anas Reister S 05/22/2017, 2:06 AM

## 2017-05-22 NOTE — B[facility] Suicide Risk Assessment (Signed)
[facility] INPATIENT:  Family/Significant Other Suicide Prevention Education  Suicide Prevention Education:  Education Completed; Laura DeforestDustin [name], boyfriend, [phone], has been identified by the patient as the family member/significant other with whom the patient will be residing, and identified as the person(s) who will aid the patient in the event of a mental health crisis (suicidal ideations/suicide attempt).  With written consent from the patient, the family member/significant other has been provided the following suicide prevention education, prior to the and/or following the discharge of the patient.  The suicide prevention education provided includes the following:  Suicide risk factors  Suicide prevention and interventions  National Suicide Hotline telephone number  [facility] assessment telephone number  [facility] Emergency Assistance 911  [facility] and/or Residential Mobile Crisis Unit telephone number  Request made of family/significant other to:  Remove weapons (e.g., guns, rifles, knives), all items previously/currently identified as safety concern.  Laura [name] reports he does have a handgun but it is locked in a safe and pt does not have access.  Remove drugs/medications (over-the-counter, prescriptions, illicit drugs), all items previously/currently identified as a safety concern. Laura [name] will check on this as well.    The family member/significant other verbalizes understanding of the suicide prevention education information provided.  The family member/significant other agrees to remove the items of safety concern listed above.  Laura [name] said pt has been crying more and more ever since the miscarriage late last year.  It has gotten to the point that pt says she doesn't even know why she is crying anymore.  Laura [name] is supportive and will continue to be so.  Laura [name], Laura [name] Jon, LCSW 05/22/2017, 10:01 AM

## 2017-05-22 NOTE — Tx Team (Signed)
Interdisciplinary Treatment and Diagnostic Plan Update  05/22/2017 Time of Session: [address] SheffieldRebecca [name] MRN: [id number]  Principal Diagnosis: <principal problem not specified>  Secondary Diagnoses: Active Problems:   MDD (major depressive disorder), single episode, severe , no psychosis (HCC)   Current Medications:  Current Facility-Administered Medications  Medication Dose Route Frequency Provider Last Rate Last Dose  . acetaminophen (TYLENOL) tablet 650 mg  650 mg Oral Q6H PRN Laveda AbbeParks, Laurie Britton, NP   650 mg at 05/22/17 0756  . alum & mag hydroxide-simeth (MAALOX/MYLANTA) 200-200-20 MG/5ML suspension 30 mL  30 mL Oral Q4H PRN Laveda AbbeParks, Laurie Britton, NP      . citalopram (CELEXA) tablet 10 mg  10 mg Oral Daily Cobos, Rockey SituFernando A, MD   10 mg at 05/22/17 0755  . famotidine (PEPCID) tablet 20 mg  20 mg Oral BID Money, Gerlene Burdockravis B, FNP   20 mg at 05/22/17 1138  . hydrOXYzine (ATARAX/VISTARIL) tablet 25 mg  25 mg Oral TID PRN Laveda AbbeParks, Laurie Britton, NP   25 mg at 05/22/17 1026  . magnesium hydroxide (MILK OF MAGNESIA) suspension 30 mL  30 mL Oral Daily PRN Laveda AbbeParks, Laurie Britton, NP      . ondansetron (ZOFRAN-ODT) disintegrating tablet 4 mg  4 mg Oral Q8H PRN Fransisca Kaufmannavis, Laura A, NP   4 mg at 05/22/17 1026  . traZODone (DESYREL) tablet 50 mg  50 mg Oral QHS PRN Laveda AbbeParks, Laurie Britton, NP   50 mg at 05/21/17 2140   PTA Medications: Medications Prior to Admission  Medication Sig Dispense Refill Last Dose  . ibuprofen (ADVIL,MOTRIN) 600 MG tablet Take 1 tablet (600 mg total) by mouth every 6 (six) hours as needed. 30 tablet 0 05/18/2017 at Unknown time  . promethazine (PHENERGAN) 25 MG tablet Take 1 tablet (25 mg total) by mouth every 6 (six) hours as needed for nausea or vomiting. 30 tablet 0 05/18/2017 at Unknown time    Patient Stressors: Marital or family conflict Occupational concerns Other: recent miscarriage  Patient Strengths: Ability for insight Average or above average  intelligence Capable of independent living Communication skills General fund of knowledge Motivation for treatment/growth Supportive family/friends  Treatment Modalities: Medication Management, Group therapy, Case management,  1 to 1 session with clinician, Psychoeducation, Recreational therapy.   Physician Treatment Plan for Primary Diagnosis: <principal problem not specified> Long Term Goal(s): Improvement in symptoms so as ready for discharge Improvement in symptoms so as ready for discharge   Short Term Goals: Ability to identify changes in lifestyle to reduce recurrence of condition will improve Ability to identify and develop effective coping behaviors will improve Ability to maintain clinical measurements within normal limits will improve Ability to identify changes in lifestyle to reduce recurrence of condition will improve Ability to verbalize feelings will improve  Medication Management: Evaluate patient's response, side effects, and tolerance of medication regimen.  Therapeutic Interventions: 1 to 1 sessions, Unit Group sessions and Medication administration.  Evaluation of Outcomes: Progressing  Physician Treatment Plan for Secondary Diagnosis: Active Problems:   MDD (major depressive disorder), single episode, severe , no psychosis (HCC)  Long Term Goal(s): Improvement in symptoms so as ready for discharge Improvement in symptoms so as ready for discharge   Short Term Goals: Ability to identify changes in lifestyle to reduce recurrence of condition will improve Ability to identify and develop effective coping behaviors will improve Ability to maintain clinical measurements within normal limits will improve Ability to identify changes in lifestyle to reduce recurrence of condition will improve Ability  to verbalize feelings will improve     Medication Management: Evaluate patient's response, side effects, and tolerance of medication regimen.  Therapeutic  Interventions: 1 to 1 sessions, Unit Group sessions and Medication administration.  Evaluation of Outcomes: Progressing   RN Treatment Plan for Primary Diagnosis: <principal problem not specified> Long Term Goal(s): Knowledge of disease and therapeutic regimen to maintain health will improve  Short Term Goals: Ability to identify and develop effective coping behaviors will improve and Compliance with prescribed medications will improve  Medication Management: RN will administer medications as ordered by provider, will assess and evaluate patient's response and provide education to patient for prescribed medication. RN will report any adverse and/or side effects to prescribing provider.  Therapeutic Interventions: 1 on 1 counseling sessions, Psychoeducation, Medication administration, Evaluate responses to treatment, Monitor vital signs and CBGs as ordered, Perform/monitor CIWA, COWS, AIMS and Fall Risk screenings as ordered, Perform wound care treatments as ordered.  Evaluation of Outcomes: Progressing   LCSW Treatment Plan for Primary Diagnosis: <principal problem not specified> Long Term Goal(s): Safe transition to appropriate next level of care at discharge, Engage patient in therapeutic group addressing interpersonal concerns.  Short Term Goals: Engage patient in aftercare planning with referrals and resources, Increase social support and Increase skills for wellness and recovery  Therapeutic Interventions: Assess for all discharge needs, 1 to 1 time with Social worker, Explore available resources and support systems, Assess for adequacy in community support network, Educate family and significant other(s) on suicide prevention, Complete Psychosocial Assessment, Interpersonal group therapy.  Evaluation of Outcomes: Progressing   Progress in Treatment: Attending groups: Yes. Participating in groups: Yes. Taking medication as prescribed: Yes. Toleration medication:  Yes. Family/Significant other contact made: Yes, individual(s) contacted:  boyfriend Patient understands diagnosis: Yes. Discussing patient identified problems/goals with staff: Yes. Medical problems stabilized or resolved: Yes. Denies suicidal/homicidal ideation: Yes. Issues/concerns per patient self-inventory: No. Other: none  New problem(s) identified: No, Describe:  none  New Short Term/Long Term Goal(s):Pt goal: stop the crying spells.  "I keep crying and I don't know why."  Discharge Plan or Barriers:   Reason for Continuation of Hospitalization: Depression Medication stabilization  Estimated Length of Stay: 3-5 days.  Attendees: Patient:Laura [name] 05/22/2017   Physician: Dr. Jama Flavors, MD 05/22/2017   Nursing: Quintella Reichert, RN 05/22/2017   RN Care Manager: 05/22/2017   Social Worker: Daleen Squibb, LCSW 05/22/2017   Recreational Therapist:  05/22/2017   Other:  05/22/2017   Other:  05/22/2017   Other: 05/22/2017        Scribe for Treatment Team: Lorri Frederick, LCSW 05/22/2017 11:41 AM

## 2017-05-22 NOTE — Progress Notes (Signed)
D:  Patient's self inventory sheet, patient sleeps good, sleep medication helpful.  Good appetite, normal energy level, good concentration.  Rated depression 8 (had bad dreams), hopeless 3, anxiety 6.  Denied withdrawals.  Denied SI.  Physical problems pain, dizzy, headache, cramps.  Worst pain in past 24 hours is #5, period cramps, headache.  Tylenol not helpful.  Goal is continue doing what I'm doing to better myself.  Stay connected.  Plans to stay out of her room.  Does have discharge plans. A:  Medications administered per MD orders.  Emotional support and encouragement given patient. R:  Patient denied SI and HI, contracts for safety.  Denied A/V hallucinations.  Safety maintained with 15 minute checks.

## 2017-05-22 NOTE — [facility] Group Notes (Signed)
[facility] 05/22/2017 1:15pm  Type of Therapy: Mental Health Association Presentation  Participation Level: Active  Participation Quality: Attentive  Affect: Appropriate  Cognitive: Oriented  Insight: Developing/Improving  Engagement in Therapy: Engaged  Modes of Intervention: Discussion, Education and Socialization  Summary of Progress/Problems: Mental Health Association (MHA) Speaker came to talk about his personal journey with mental health. The pt processed ways by which to relate to the speaker. MHA speaker provided handouts and educational information pertaining to groups and services offered by [facility]. Pt was engaged in speaker's presentation and was receptive to resources provided.    Pulte HomesHeather N Smart, LCSW 05/22/2017 12:51 PM

## 2017-05-22 NOTE — Plan of Care (Signed)
Nurse discussed anxiety, depression, coping skills with patient. 

## 2017-05-22 NOTE — Progress Notes (Addendum)
[facility] MD Progress Note  05/22/2017 1:01 PM Laura [name]  MRN:  [id number]   Subjective:  Patient reports that she is doing better and denies any medication side effects. She reports that she was tearful earlier after group due to the discussion about loses in December and that was the month that she had both of her miscarriages. "It just hit a nerve from my past, but I feel better now. I just really related to the discussion." She denies any SI/HI/AVH and contracts for safety. She rates her depression at 2/10 and no anxiety. She requests to have a 1:1 session with the peer support specialist.  She has complaint of gastric indigestion and takes Pepcid at home and requests something while here.   Objective: Patient's chart and findings reviewed and discussed with treatment team. Patient is pleasant and cooperative. She has been in the day room interacting appropriately and attending groups. She is tolerating the medications well and has shown improvement. Will continue current medications. Will start Pepcid Bid.  Principal Problem: MDD (major depressive disorder), single episode, severe , no psychosis (HCC) Diagnosis:   Patient Active Problem List   Diagnosis Date Noted  . MDD (major depressive disorder), single episode, severe , no psychosis (HCC) [F32.2] 05/20/2017  . S/P cesarean section [Z98.891] 01/01/2016   Total Time spent with patient: 30 minutes  Past Psychiatric History: See H&P  Past Medical History:  Past Medical History:  Diagnosis Date  . Depression    since 12/18 from [facility]    Past Surgical History:  Procedure Laterality Date  . APPENDECTOMY    . CESAREAN SECTION N/A 12/30/2015   Procedure: CESAREAN SECTION;  Surgeon: Adam Phenix, MD;  Location: [facility]ING [facility];  Service: Obstetrics;  Laterality: N/A;  . CHOLECYSTECTOMY     Family History:  Family History  Problem Relation Age of Onset  . Cancer Maternal Aunt   . Cancer Maternal Uncle   . Diabetes Paternal  Aunt   . Diabetes Paternal Uncle   . Diabetes Maternal Grandmother   . Cancer Maternal Grandfather   . Cancer Paternal Grandmother   . Diabetes Paternal Grandmother   . Diabetes Paternal Grandfather    Family Psychiatric  History: See H&P Social History:  Social History   Substance and Sexual Activity  Alcohol Use No     Social History   Substance and Sexual Activity  Drug Use No    Social History   Socioeconomic History  . Marital status: Single    Spouse name: None  . Number of children: None  . Years of education: None  . Highest education level: None  Social Needs  . Financial resource strain: None  . Food insecurity - worry: None  . Food insecurity - inability: None  . Transportation needs - medical: None  . Transportation needs - non-medical: None  Occupational History  . None  Tobacco Use  . Smoking status: Former Games developer  . Smokeless tobacco: Former Engineer, water and Sexual Activity  . Alcohol use: No  . Drug use: No  . Sexual activity: Yes    Birth control/protection: None  Other Topics Concern  . None  Social History Narrative  . None   Additional Social History:                         Sleep: Good  Appetite:  Good  Current Medications: Current Facility-Administered Medications  Medication Dose Route Frequency Provider Last Rate Last Dose  .  acetaminophen (TYLENOL) tablet 650 mg  650 mg Oral Q6H PRN Laveda Abbe, NP   650 mg at 05/22/17 0756  . alum & mag hydroxide-simeth (MAALOX/MYLANTA) 200-200-20 MG/5ML suspension 30 mL  30 mL Oral Q4H PRN Laveda Abbe, NP      . citalopram (CELEXA) tablet 10 mg  10 mg Oral Daily Cobos, Rockey Situ, MD   10 mg at 05/22/17 0755  . famotidine (PEPCID) tablet 20 mg  20 mg Oral BID Money, Gerlene Burdock, FNP   20 mg at 05/22/17 1138  . hydrOXYzine (ATARAX/VISTARIL) tablet 25 mg  25 mg Oral TID PRN Laveda Abbe, NP   25 mg at 05/22/17 1026  . magnesium hydroxide (MILK OF MAGNESIA)  suspension 30 mL  30 mL Oral Daily PRN Laveda Abbe, NP      . ondansetron (ZOFRAN-ODT) disintegrating tablet 4 mg  4 mg Oral Q8H PRN Fransisca Kaufmann A, NP   4 mg at 05/22/17 1026  . traZODone (DESYREL) tablet 50 mg  50 mg Oral QHS PRN Laveda Abbe, NP   50 mg at 05/21/17 2140    Lab Results:  Results for orders placed or performed during the hospital encounter of 05/20/17 (from the past 48 hour(s))  TSH     Status: Abnormal   Collection Time: 05/22/17  7:23 AM  Result Value Ref Range   TSH 4.791 (H) 0.350 - 4.500 uIU/mL    Comment: Performed by a 3rd Generation assay with a functional sensitivity of <=0.01 uIU/mL. Performed at [facility], [address] [place], [place] [address]     Blood Alcohol level:  Lab Results  Component Value Date   ETH <10 05/19/2017    Metabolic Disorder Labs: No results found for: HGBA1C, MPG No results found for: PROLACTIN No results found for: CHOL, TRIG, HDL, CHOLHDL, VLDL, LDLCALC  Physical Findings: AIMS: Facial and Oral Movements Muscles of Facial Expression: None, normal Lips and Perioral Area: None, normal Jaw: None, normal Tongue: None, normal,Extremity Movements Upper (arms, wrists, hands, fingers): None, normal Lower (legs, knees, ankles, toes): None, normal, Trunk Movements Neck, shoulders, hips: None, normal, Overall Severity Severity of abnormal movements (highest score from questions above): None, normal Incapacitation due to abnormal movements: None, normal Patient's awareness of abnormal movements (rate only patient's report): No Awareness, Dental Status Current problems with teeth and/or dentures?: No Does patient usually wear dentures?: No  CIWA:    COWS:     Musculoskeletal: Strength & Muscle Tone: within normal limits Gait & Station: normal Patient leans: N/A  Psychiatric Specialty Exam: Physical Exam  Nursing note and vitals reviewed. Constitutional: She is oriented to person,  place, and time. She appears well-developed and well-nourished.  Cardiovascular: Normal rate.  Respiratory: Effort normal.  Musculoskeletal: Normal range of motion.  Neurological: She is alert and oriented to person, place, and time.  Skin: Skin is warm.    Review of Systems  Constitutional: Negative.   HENT: Negative.   Eyes: Negative.   Respiratory: Negative.   Cardiovascular: Negative.   Gastrointestinal: Negative.   Genitourinary: Negative.   Musculoskeletal: Negative.   Skin: Negative.   Neurological: Negative.   Endo/Heme/Allergies: Negative.   Psychiatric/Behavioral: Positive for depression. Negative for hallucinations and suicidal ideas. The patient is not nervous/anxious.     Blood pressure 104/64, pulse 75, temperature 98.1 F (36.7 C), temperature source Oral, resp. rate 16, height 5\' 7"  (1.702 m), weight 71.2 kg (157 lb), unknown if currently breastfeeding.Body mass index  is 24.59 kg/m.  General Appearance: Casual  Eye Contact:  Good  Speech:  Clear and Coherent and Normal Rate  Volume:  Normal  Mood:  Euthymic  Affect:  Congruent  Thought Process:  Goal Directed and Descriptions of Associations: Intact  Orientation:  Full (Time, Place, and Person)  Thought Content:  WDL  Suicidal Thoughts:  No  Homicidal Thoughts:  No  Memory:  Immediate;   Good Recent;   Good Remote;   Good  Judgement:  Good  Insight:  Good  Psychomotor Activity:  Normal  Concentration:  Concentration: Good and Attention Span: Good  Recall:  Good  Fund of Knowledge:  Good  Language:  Good  Akathisia:  No  Handed:  Right  AIMS (if indicated):     Assets:  Communication Skills Desire for Improvement Financial Resources/Insurance Housing Physical Health Social Support Transportation  ADL's:  Intact  Cognition:  WNL  Sleep:  Number of Hours: 6.75   Problems Addressed: MDD severe Indigestion  Treatment Plan Summary: Daily contact with patient to assess and evaluate symptoms and  progress in treatment, Medication management and Plan is to:  -Continue Celexa 10 mg PO Daily for mood stability -Continue Trazodone 50 mg PO QHS PRN for insomnia -Continue Vistaril 25 mg PO TID PRN for anxiety -Start Pepcid 20 mg BID for indigestion -Encourage group therapy participation  Maryfrances Bunnellravis B Money, FNP 05/22/2017, 1:00 PM   Agree with NP Progress Note

## 2017-05-22 NOTE — [facility] Group Notes (Signed)
[facility] Group Notes:  (Nursing/MHT/Case Management/Adjunct)  Date:  05/22/2017  Time:  1:30 p.m.  Type of Therapy:  Nurse Education  Participation Level:  Active  Participation Quality:  Appropriate  Affect:  Appropriate  Cognitive:  Appropriate  Insight:  Appropriate  Engagement in Group:  Engaged  Modes of Intervention:  Education  Summary of Progress/Problems:  Patient responded appropriately and actively participated in this group.    [name], Baine Decesare Shephard 05/22/2017, 3:32 PM

## 2017-05-22 NOTE — [facility] Group Notes (Signed)
[facility] Group Notes:  (Nursing/MHT/Case Management/Adjunct)  Date:  05/22/2017  Time:  4:15 PM  Type of Therapy:  Psychoeducational Skills  Participation Level:  Active  Participation Quality:  Appropriate and Attentive  Affect:  Appropriate  Cognitive:  Alert and Appropriate  Insight:  Appropriate  Engagement in Group:  Engaged  Modes of Intervention:  Activity, Discussion and Education  Summary of Progress/Problems: Patient attended group and participated. 

## 2017-05-22 NOTE — Progress Notes (Signed)
D    Pt is pleasant on approach   She is visible on the milieu and interacts well with staff and peers   Her behavior is appropriate   She continues to endorse anxiety and depression but reports improvement since coming here   Pt denies nausea at this time and said her medication has helped her  A    Verbal support given    Medications administered and effectiveness monitored   Q 15 min checks R   Pt is safe at present time and receptive to verbal support

## 2017-05-23 DIAGNOSIS — K219 Gastro-esophageal reflux disease without esophagitis: Secondary | ICD-10-CM

## 2017-05-23 NOTE — Progress Notes (Signed)
Nutrition Education Note  Pt attended group focusing on general, healthful nutrition education.  RD emphasized the importance of eating regular meals and snacks throughout the day. Consuming sugar-free beverages and incorporating fruits and vegetables into diet when possible. Provided examples of healthy snacks. Patient encouraged to leave group with a goal to improve nutrition/healthy eating.   Diet Order: Diet regular Room service appropriate? No; Fluid consistency: Thin Pt is also offered choice of unit snacks mid-morning and mid-afternoon.  Pt is eating as desired.   If additional nutrition issues arise, please consult RD.     Aldean Suddeth, MS, RD, LDN, CNSC Inpatient Clinical Dietitian Pager # [phone] After hours/weekend pager # [phone]     

## 2017-05-23 NOTE — Progress Notes (Signed)
D: Pt A & O X4. Denies SI, HI, AVH and pain when assessed. Rates her depression 6/10, hopelessness 4/10 and anxiety 4/10. Pt reports good sleep last night with good appetite with low energy and good concentration level when assessed. Pt's goal is to work on Pharmacologistcoping skills, "work on my personal plan for when I go home".  A: Scheduled and PRN medications administered as ordered with verbal education and effects monitored. Emotional support and encouragement provided to pt throughout this shift. Q 15 minutes safety checks maintained without outburst or self harm gestures thus far.   R: Pt receptive to care. Cooperative with unit routines. Attended one scheduled group and was engaged. Went off unit for meals, returned without issues. POC continues for safety and mood stability.

## 2017-05-23 NOTE — Progress Notes (Signed)
Psychoeducational Group Note  Date:  05/23/2017 Time:  1540  Group Topic/Focus:  Crisis Planning:   The purpose of this group is to help patients create a crisis plan for use upon discharge or in the future, as needed.  Participation Level: Did Not Attend  Participation Quality:  Not Applicable  Affect:  Not Applicable  Cognitive:  Not Applicable  Insight:  Not Applicable  Engagement in Group: Not Applicable  Additional Comments:  Pt did not attend group this morning.  Bria Sparr E 05/23/2017, 3:41 PM

## 2017-05-23 NOTE — [facility] Group Notes (Signed)
Adult Psychoeducational Group Note  Date:  05/23/2017 Time:  12:25 AM  Group Topic/Focus:  Wrap-Up Group:   The focus of this group is to help patients review their daily goal of treatment and discuss progress on daily workbooks.  Participation Level:  Active  Participation Quality:  Appropriate  Affect:  Appropriate  Cognitive:  Alert and Oriented  Insight: Appropriate  Engagement in Group:  Engaged  Modes of Intervention:  Exploration and Support  Additional Comments:  Pt verbalized that she had a really good day.  Pt reported that she had a rough morning. Pt reported that she realized that her medication are working and rated her day a 6.  Pt stated that her goal was getting out of her shell and know when she's going home. Pt verbalized two ways to improve her socialization skills are not being scared to talk and realizing that he doesn't matter what others think.   Jessicaann Overbaugh, Randal Bubaerri Lee 05/23/2017, 12:25 AM

## 2017-05-23 NOTE — Progress Notes (Signed)
D    Pt reports feeling great and having an especially good day   She reports she will be discharged tomorrow and is very excited about same    Her interactions with others and her behavior are all appropriate  And she expresses good insight into ways to stay well A   Verbal support given   Medications administered and effectiveness monitored    Discussed healthy habits and coping skills    Q 15 min checks R   Pt is safe at present time and receptive to verbal support

## 2017-05-23 NOTE — Progress Notes (Addendum)
[facility] MD Progress Note  05/23/2017 4:31 PM Laura [name]  MRN:  [id number]   Subjective:  Patient reports she is feeling better than she did prior to admission. As she improves, she is focusing more on disposition planning options and is looking forward to discharge soon. States she had a good visit from BF yesterday. Denies medication side effects. Denies suicidal ideations.   Objective: I have discussed case with treatment team and have met with patient. Patient presents with improving mood, fuller range of affect, and states she is feeling significantly better. She is visible in milieu, going to groups, interactive with peers. She states she is tolerating medications well, denies side effects at this time, and reports Vistaril PRNs have been helpful for anxiety and not associated with side effects or sedation. She is looking forward to discharging soon. TSH is marginally elevated at 4.791   Principal Problem: MDD (major depressive disorder), single episode, severe , no psychosis ([place]) Diagnosis:   Patient Active Problem List   Diagnosis Date Noted  . Indigestion [K30] 05/22/2017  . MDD (major depressive disorder), single episode, severe , no psychosis ([place]) [F32.2] 05/20/2017  . S/P cesarean section [Z98.891] 01/01/2016   Total Time spent with patient: 15 minutes  Past Psychiatric History: See H&P  Past Medical History:  Past Medical History:  Diagnosis Date  . Depression    since 12/18 from [place]    Past Surgical History:  Procedure Laterality Date  . APPENDECTOMY    . CESAREAN SECTION N/A 12/30/2015   Procedure: CESAREAN SECTION;  Surgeon: Woodroe Mode, MD;  Location: [facility];  Service: Obstetrics;  Laterality: N/A;  . CHOLECYSTECTOMY     Family History:  Family History  Problem Relation Age of Onset  . Cancer Maternal Aunt   . Cancer Maternal Uncle   . Diabetes Paternal Aunt   . Diabetes Paternal Uncle   . Diabetes Maternal Grandmother   . Cancer  Maternal Grandfather   . Cancer Paternal Grandmother   . Diabetes Paternal Grandmother   . Diabetes Paternal Grandfather    Family Psychiatric  History: See H&P Social History:  Social History   Substance and Sexual Activity  Alcohol Use No     Social History   Substance and Sexual Activity  Drug Use No    Social History   Socioeconomic History  . Marital status: Single    Spouse name: None  . Number of children: None  . Years of education: None  . Highest education level: None  Social Needs  . Financial resource strain: None  . Food insecurity - worry: None  . Food insecurity - inability: None  . Transportation needs - medical: None  . Transportation needs - non-medical: None  Occupational History  . None  Tobacco Use  . Smoking status: Former Research scientist (life sciences)  . Smokeless tobacco: Former Network engineer and Sexual Activity  . Alcohol use: No  . Drug use: No  . Sexual activity: Yes    Birth control/protection: None  Other Topics Concern  . None  Social History Narrative  . None   Additional Social History:   Sleep: Good  Appetite:  Good  Current Medications: Current Facility-Administered Medications  Medication Dose Route Frequency Provider Last Rate Last Dose  . acetaminophen (TYLENOL) tablet 650 mg  650 mg Oral Q6H PRN Ethelene Hal, NP   650 mg at 05/23/17 1305  . alum & mag hydroxide-simeth (MAALOX/MYLANTA) 200-200-20 MG/5ML suspension 30 mL  30 mL Oral Q4H  PRN Ethelene Hal, NP   30 mL at 05/22/17 1313  . citalopram (CELEXA) tablet 10 mg  10 mg Oral Daily Jolee Critcher, Myer Peer, MD   10 mg at 05/23/17 0855  . famotidine (PEPCID) tablet 20 mg  20 mg Oral BID Money, Lowry Ram, FNP   20 mg at 05/23/17 0855  . hydrOXYzine (ATARAX/VISTARIL) tablet 25 mg  25 mg Oral TID PRN Ethelene Hal, NP   25 mg at 05/23/17 1444  . magnesium hydroxide (MILK OF MAGNESIA) suspension 30 mL  30 mL Oral Daily PRN Ethelene Hal, NP      . ondansetron  (ZOFRAN-ODT) disintegrating tablet 4 mg  4 mg Oral Q8H PRN Elmarie Shiley A, NP   4 mg at 05/23/17 1304  . traZODone (DESYREL) tablet 50 mg  50 mg Oral QHS PRN Ethelene Hal, NP   50 mg at 05/22/17 2147    Lab Results:  Results for orders placed or performed during the hospital encounter of 05/20/17 (from the past 48 hour(s))  TSH     Status: Abnormal   Collection Time: 05/22/17  7:23 AM  Result Value Ref Range   TSH 4.791 (H) 0.350 - 4.500 uIU/mL    Comment: Performed by a 3rd Generation assay with a functional sensitivity of <=0.01 uIU/mL. Performed at [facility], [place] [address], [place], [place] [address]     Blood Alcohol level:  Lab Results  Component Value Date   ETH <10 51/76/1607    Metabolic Disorder Labs: No results found for: HGBA1C, MPG No results found for: PROLACTIN No results found for: CHOL, TRIG, HDL, CHOLHDL, VLDL, LDLCALC  Physical Findings: AIMS: Facial and Oral Movements Muscles of Facial Expression: None, normal Lips and Perioral Area: None, normal Jaw: None, normal Tongue: None, normal,Extremity Movements Upper (arms, wrists, hands, fingers): None, normal Lower (legs, knees, ankles, toes): None, normal, Trunk Movements Neck, shoulders, hips: None, normal, Overall Severity Severity of abnormal movements (highest score from questions above): None, normal Incapacitation due to abnormal movements: None, normal Patient's awareness of abnormal movements (rate only patient's report): No Awareness, Dental Status Current problems with teeth and/or dentures?: No Does patient usually wear dentures?: No  CIWA:  CIWA-Ar Total: 1 COWS:  COWS Total Score: 1  Musculoskeletal: Strength & Muscle Tone: within normal limits Gait & Station: normal Patient leans: N/A  Psychiatric Specialty Exam: Physical Exam  Nursing note and vitals reviewed. Constitutional: She is oriented to person, place, and time. She appears well-developed and  well-nourished.  Cardiovascular: Normal rate.  Respiratory: Effort normal.  Musculoskeletal: Normal range of motion.  Neurological: She is alert and oriented to person, place, and time.  Skin: Skin is warm.    Review of Systems  Constitutional: Negative.   HENT: Negative.   Eyes: Negative.   Respiratory: Negative.   Cardiovascular: Negative.   Gastrointestinal: Negative.   Genitourinary: Negative.   Musculoskeletal: Negative.   Skin: Negative.   Neurological: Negative.   Endo/Heme/Allergies: Negative.   Psychiatric/Behavioral: Positive for depression. Negative for hallucinations and suicidal ideas. The patient is not nervous/anxious.   denies headache , denies chest pain, reports nausea improved and today does not endorse vomiting   Blood pressure 105/72, pulse 74, temperature 98.1 F (36.7 C), temperature source Oral, resp. rate 18, height 5' 7" (1.702 m), weight 71.2 kg (157 lb), unknown if currently breastfeeding.Body mass index is 24.59 kg/m.  General Appearance: Well Groomed  Eye Contact:  Good  Speech:  Normal Rate  Volume:  Normal  Mood:  improved  Affect:  Appropriate and Full Range  Thought Process:  Linear and Descriptions of Associations: Intact  Orientation:  Other:  fully alert and attentive   Thought Content:  denies hallucinations, no delusions, not internally preoccupied   Suicidal Thoughts:  No denies suicidal or self injurious ideations, denies homicidal or violent ideations   Homicidal Thoughts:  No  Memory:  recent and remote grossly intact   Judgement:  Other:  improving   Insight:  improving   Psychomotor Activity:  Normal  Concentration:  Concentration: Good and Attention Span: Good  Recall:  Good  Fund of Knowledge:  Good  Language:  Good  Akathisia:  No  Handed:  Right  AIMS (if indicated):     Assets:  Communication Skills Desire for Improvement Financial Resources/Insurance Housing Physical Health Social Support Transportation  ADL's:   Intact  Cognition:  WNL  Sleep:  Number of Hours: 6.75   Assessment - patient is presenting with improving mood and range of affect. Currently presents euthymic, with full range of affect, and focused on discharge planning . Had reported some nausea initially, but at this time denies any significant GI symptoms and states she is tolerating Celexa well . TSH minimally elevated, but patient reports significant family history for hypothyroidism and concern that this could be playing role in her symptoms- will follow up with FT3, FT4  No SI.   Treatment Plan Summary: Treatment Plan reviewed as below today 3/7  Daily contact with patient to assess and evaluate symptoms and progress in treatment, Medication management and Plan is to:  -Continue Celexa 10 mg PO Daily for depression, anxiety -Continue Trazodone 50 mg PO QHS PRN for insomnia as needed  -Continue Vistaril 25 mg PO TID PRN for anxiety as needed  -Continue Pepcid 20 mg BID for GERD symptoms, reports helpful and well tolerated  -Encourage group therapy participation to work on Radiographer, therapeutic and symptom reduction -Check FT3, FT4   -Treatment Team working on disposition planning options  Jenne Campus, MD 05/23/2017, 4:31 PM   Patient ID: Laura [name], female   DOB: 09-28-1995, 22 y.o.   MRN: [id number]

## 2017-05-23 NOTE — [facility] Group Notes (Signed)
[facility] LCSW Group Therapy Note  Date/Time: 05/23/17, 1315  Type of Therapy/Topic:  Group Therapy:  Balance in Life  Participation Level:  moderate  Description of Group:    This group will address the concept of balance and how it feels and looks when one is unbalanced. Patients will be encouraged to process areas in their lives that are out of balance, and identify reasons for remaining unbalanced. Facilitators will guide patients utilizing problem- solving interventions to address and correct the stressor making their life unbalanced. Understanding and applying boundaries will be explored and addressed for obtaining  and maintaining a balanced life. Patients will be encouraged to explore ways to assertively make their unbalanced needs known to significant others in their lives, using other group members and facilitator for support and feedback.  Therapeutic Goals: 1. Patient will identify two or more emotions or situations they have that consume much of in their lives. 2. Patient will identify signs/triggers that life has become out of balance:  3. Patient will identify two ways to set boundaries in order to achieve balance in their lives:  4. Patient will demonstrate ability to communicate their needs through discussion and/or role plays  Summary of Patient Progress:Pt shared that physical, mental/emotional and friends are out of balance in her life currently.  Pt did participate in group discussion about ways to address areas of life that are out of balance.  Pt was attentive during the group discussion.          Therapeutic Modalities:   Cognitive Behavioral Therapy Solution-Focused Therapy Assertiveness Training  Greg Courtland Coppa, LCSW 

## 2017-05-23 NOTE — Progress Notes (Signed)
Adult Psychoeducational Group Note  Date:  05/23/2017 Time:  1600  Group Topic/Focus:  Coping With Mental Health Crisis:   The purpose of this group is to help patients identify strategies for coping with mental health crisis.  Group discusses possible causes of crisis and ways to manage them effectively.  Participation Level:  Active  Participation Quality:  Appropriate  Affect:  Appropriate  Cognitive:  Appropriate  Insight: Appropriate  Engagement in Group:  Engaged  Modes of Intervention:  Activity  Additional Comments:    Ceylon Arenson L 05/23/2017, 5:15 PM            

## 2017-05-24 LAB — T4, FREE: FREE T4: 1.26 ng/dL — AB (ref 0.61–1.12)

## 2017-05-24 MED ORDER — CITALOPRAM HYDROBROMIDE 10 MG PO TABS: 10.0000 mg | ORAL_TABLET | Freq: Every day | ORAL | 0 refills | Status: DC

## 2017-05-24 MED ORDER — HYDROXYZINE HCL 25 MG PO TABS: 25.0000 mg | ORAL_TABLET | Freq: Three times a day (TID) | ORAL | 0 refills | Status: DC | PRN

## 2017-05-24 MED ORDER — TRAZODONE HCL 50 MG PO TABS: 50.0000 mg | ORAL_TABLET | Freq: Every evening | ORAL | 0 refills | Status: DC | PRN

## 2017-05-24 NOTE — [facility] Suicide Risk Assessment (Signed)
[facility]   Principal Problem: MDD (major depressive disorder), single episode, severe , no psychosis ([facility]) Discharge Diagnoses:  Patient Active Problem List   Diagnosis Date Noted  . Indigestion [K30] 05/22/2017  . MDD (major depressive disorder), single episode, severe , no psychosis ([facility]) [F32.2] 05/20/2017  . S/P cesarean section [Z98.891] 01/01/2016    Total Time spent with patient: 30 minutes  Musculoskeletal: Strength & Muscle Tone: within normal limits Gait & Station: normal Patient leans: N/A  Psychiatric Specialty Exam: ROS  Denies headache, no chest pain, no shortness of breath, no vomiting   Blood pressure 105/72, pulse 74, temperature 98.1 F (36.7 C), temperature source Oral, resp. rate 18, height 5\' 7"  (1.702 m), weight 71.2 kg (157 lb), unknown if currently breastfeeding.Body mass index is 24.59 kg/m.  General Appearance: Well Groomed  Eye Contact::  Good  Speech:  Normal Rate409  Volume:  Normal  Mood:  Euthymic  Affect:  appropriate, reactive, full in range   Thought Process:  Linear and Descriptions of Associations: Intact  Orientation:  Full (Time, Place, and Person)  Thought Content:  denies hallucinations, no delusions, not internally preoccupied   Suicidal Thoughts:  No denies suicidal ideations, no self injurious ideations, no homicidal or violent ideations  Homicidal Thoughts:  No  Memory:  recent and remote grossly intact   Judgement:  Other:  improving   Insight:  improving   Psychomotor Activity:  Normal  Concentration:  Good  Recall:  Good  Fund of Knowledge:Good  Language: Good  Akathisia:  Negative  Handed:  Right  AIMS (if indicated):     Assets:  Desire for Improvement Resilience  Sleep:  Number of Hours: 6  Cognition: WNL  ADL's:  Intact   Mental Status Per Nursing Assessment::   On Admission:     Demographic Factors:  22 year old single female, lives with mother and boyfriend, has two children,  employed   Loss Factors: Miscarriage ( 12/18)   Historical Factors: Denies history of prior suicide attempts, no history of cutting, history of one prior psychiatric admission at age 89.   Risk Reduction Factors:   Responsible for children under 818 years of age, Employed, Living with another person, especially a relative and Positive coping skills or problem solving skills  Continued Clinical Symptoms:  Currently patient is alert, attentive, well related, pleasant, states she feels better ,and presents euthymic, with full range of affect, no thought disorder, no suicidal or self injurious ideations, no homicidal or violent ideations, no psychotic symptoms. Future oriented . Denies medication side effects. Side effects reviewed.  Behavior on unit calm and in good control, pleasant on approach.   Cognitive Features That Contribute To Risk:  No gross cognitive deficits noted upon discharge. Is alert , attentive, and oriented x 3   Suicide Risk:  Mild:  Suicidal ideation of limited frequency, intensity, duration, and specificity.  There are no identifiable plans, no associated intent, mild dysphoria and related symptoms, good self-control (both objective and subjective assessment), few other risk factors, and identifiable protective factors, including available and accessible social support.  Follow-up Information    Services, [facility]. Go on 05/28/2017.   Why:  Please attend your intake appt on Tuesday, 05/28/17, at 8:00am.  Please bring a copy of your hospital discharge paperwork. Contact information: [address] [place] [address] [phone]           Plan Of Care/Follow-up recommendations:  Activity:  as tolerated  Diet:  Regular Tests:  NA Other:  See below  Patient reports readiness for discharge and is leaving unit in good spirits. Plans to return home Plans to follow up as above .  Craige Cotta, MD 05/24/2017, 7:51 AM
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
## 2017-05-24 NOTE — Progress Notes (Signed)
Adult Psychoeducational Group Note  Date:  05/24/2017 Time:  0830 Group Topic/Focus:  Goals Group:   The focus of this group is to help patients establish daily goals to achieve during treatment and discuss how the patient can incorporate goal setting into their daily lives to aide in recovery. Orientation:   The focus of this group is to educate the patient on the purpose and policies of crisis stabilization and provide a format to answer questions about their admission.  The group details unit policies and expectations of patients while admitted.  Participation Level:  Active  Participation Quality:  Appropriate  Affect:  Appropriate  Cognitive:  Appropriate  Insight: Appropriate  Engagement in Group:  Engaged  Modes of Intervention:  Discussion, Rapport Building and Support  Additional Comments:  Pt attended and participated in group discussion  Gwenevere Ghazili, Amarya Kuehl Patience 05/24/2017, 10:10 AM

## 2017-05-24 NOTE — Progress Notes (Signed)
Recreation Therapy Notes  Date: 05/24/17 Time: 0930 Location: 300 Hall Dayroom  Group Topic: Stress Management  Goal Area(s) Addresses:  Patient will verbalize importance of using healthy stress management.  Patient will identify positive emotions associated with healthy stress management.   Behavioral Response: Engaged  Intervention: Stress Management  Activity :  [employer] Meditation.  LRT played [name] meditation that explained the concept of taking on the characteristics of mountains (resilience, steady, immoveable) in daily life.  Patients were to follow along as the meditation was read to engage in the practice.  Education:  Stress Management, Discharge Planning.   Education Outcome: Acknowledges edcuation/In group clarification offered/Needs additional education  Clinical Observations/Feedback: Pt attended group.    Caroll RancherMarjette Angely [name], LRT/CTRS         Laura [name], Laura [name] 05/24/2017 10:41 AM

## 2017-05-24 NOTE — Progress Notes (Signed)
Patient is prepared for dc by this Clinical research associatewriter as she is given cc of dc instructiosn and these are discussed and reviewed with her ( SRA, AVS, SSP and transition record). SHe asks questions and thenverbaliize understnading and says she will comply with these palns. She is given prescriptions for her meds and all belongings in her locker are returned to [facility]. She completes her daily assessment and on this she writes she deneis SI today and she rates her depression, hopelessness and anxiety " 3/2/4", respectively. She is escorted to bldg entrance and dc'd per MD order.

## 2017-05-24 NOTE — Progress Notes (Signed)
  [facility] :  Will you be returning to the same living situation after discharge:  Yes,  with boyfriend At discharge, do you have transportation home?: Yes,  boyfriend Do you have the ability to pay for your medications: Yes,  mediciad  Release of information consent forms completed and in the chart;  Patient's signature needed at discharge.  Patient to Follow up at: Follow-up Information    [facility]. Go on 05/28/2017.   Why:  Please attend your intake appt on Tuesday, 05/28/17, at 8:00am.  Please bring a copy of your hospital discharge paperwork. Contact information: [address] [place] [id number] [phone]           Next level of care provider [facility] Link:no  Safety Planning and Suicide Prevention discussed: Yes,  with boyfriend  Have you used any form of tobacco in the last 30 days? (Cigarettes, Smokeless Tobacco, Cigars, and/or Pipes): No  Has patient been referred to the Quitline?: N/A patient is not a smoker  Patient has been referred for addiction treatment: Yes  Lorri FrederickWierda, Jessia Kief Jon, LCSW 05/24/2017, 9:15 AM

## 2017-05-25 LAB — T3, FREE: T3, Free: 3.2 pg/mL (ref 2.0–4.4)

## 2017-06-17 ENCOUNTER — Encounter: Payer: Self-pay | Admitting: Certified Nurse Midwife

## 2017-06-17 ENCOUNTER — Other Ambulatory Visit: Payer: Medicaid Other

## 2017-06-17 DIAGNOSIS — Z3200 Encounter for pregnancy test, result unknown: Secondary | ICD-10-CM

## 2017-06-18 ENCOUNTER — Other Ambulatory Visit: Payer: Self-pay | Admitting: *Deleted

## 2017-06-18 DIAGNOSIS — Z3200 Encounter for pregnancy test, result unknown: Secondary | ICD-10-CM

## 2017-06-18 LAB — HCG, QUANTITATIVE, PREGNANCY: HCG, Total, QN: 120 m[IU]/mL

## 2017-06-19 ENCOUNTER — Other Ambulatory Visit: Payer: Medicaid Other

## 2017-06-19 DIAGNOSIS — Z3200 Encounter for pregnancy test, result unknown: Secondary | ICD-10-CM

## 2017-06-20 LAB — HCG, QUANTITATIVE, PREGNANCY: HCG, Total, QN: 289 m[IU]/mL

## 2017-07-02 ENCOUNTER — Ambulatory Visit: Payer: Self-pay | Admitting: Obstetrics & Gynecology

## 2017-07-02 ENCOUNTER — Other Ambulatory Visit (INDEPENDENT_AMBULATORY_CARE_PROVIDER_SITE_OTHER): Payer: 59

## 2017-07-02 DIAGNOSIS — Z3687 Encounter for antenatal screening for uncertain dates: Secondary | ICD-10-CM | POA: Diagnosis not present

## 2017-07-02 DIAGNOSIS — Z3480 Encounter for supervision of other normal pregnancy, unspecified trimester: Secondary | ICD-10-CM

## 2017-07-02 NOTE — Progress Notes (Signed)
Pt here for a viability TVUS GS seen and measures 6.31mmx5.2mm.  Small yolk sac noted.  No fetal pole noted.  Pt request a repeat BHCG.  Will notify pt of results and she is scheduled for NOB appt 07/12/17.  Lt CL noted, no free fluid noted.

## 2017-07-03 LAB — HCG, QUANTITATIVE, PREGNANCY: HCG, TOTAL, QN: 1369 m[IU]/mL

## 2017-07-04 ENCOUNTER — Other Ambulatory Visit: Payer: Self-pay

## 2017-07-04 ENCOUNTER — Other Ambulatory Visit (INDEPENDENT_AMBULATORY_CARE_PROVIDER_SITE_OTHER): Payer: 59

## 2017-07-04 DIAGNOSIS — O209 Hemorrhage in early pregnancy, unspecified: Secondary | ICD-10-CM | POA: Diagnosis not present

## 2017-07-04 LAB — TIQ- MISLABELED: DATE RECEIVED:: 4182019

## 2017-07-04 NOTE — Progress Notes (Signed)
Pt here this AM for a Rpt BHCG.  She states she has started to bleed this morning and started having pelvic cramping.  HCG will be stat.

## 2017-07-06 ENCOUNTER — Telehealth: Payer: Self-pay | Admitting: Family Medicine

## 2017-07-06 LAB — PAT ID TIQ DOC: Test Affected: 19485

## 2017-07-06 LAB — HCG, QUANTITATIVE, PREGNANCY: HCG, TOTAL, QN: 1224 m[IU]/mL

## 2017-07-06 NOTE — Telephone Encounter (Signed)
Patient Name: Laura [name], female   DOB: May 22, 1995, 22 y.o.  MRN: [id number]  Received call from Lab - bHCG drawn  4/18: 1224 4/16: 1369  In reviewing the chart, no outpt note at time of draw. Email note suggests that she has history of miscarriages and wanted to track hormone levels.  Attempted to contact patient - left message on phone.  Levie HeritageJacob J Joandy Burget, DO 07/06/2017 8:48 AM

## 2017-07-08 ENCOUNTER — Encounter (HOSPITAL_COMMUNITY): Payer: Self-pay | Admitting: *Deleted

## 2017-07-08 ENCOUNTER — Inpatient Hospital Stay (HOSPITAL_COMMUNITY)
Admission: AD | Admit: 2017-07-08 | Discharge: 2017-07-08 | Disposition: A | Discharge status: Home / Self Care | Payer: Medicaid Other | Source: Ambulatory Visit | Attending: Obstetrics & Gynecology | Admitting: Obstetrics & Gynecology

## 2017-07-08 ENCOUNTER — Inpatient Hospital Stay (HOSPITAL_COMMUNITY): Payer: Medicaid Other

## 2017-07-08 ENCOUNTER — Ambulatory Visit (INDEPENDENT_AMBULATORY_CARE_PROVIDER_SITE_OTHER): Payer: Medicaid Other | Admitting: Obstetrics & Gynecology

## 2017-07-08 ENCOUNTER — Encounter: Payer: Self-pay | Admitting: Obstetrics & Gynecology

## 2017-07-08 VITALS — BP 115/74 | HR 63 | Wt 159.0 lb

## 2017-07-08 DIAGNOSIS — R1030 Lower abdominal pain, unspecified: Secondary | ICD-10-CM | POA: Diagnosis not present

## 2017-07-08 DIAGNOSIS — Z87891 Personal history of nicotine dependence: Secondary | ICD-10-CM | POA: Diagnosis not present

## 2017-07-08 DIAGNOSIS — Z888 Allergy status to other drugs, medicaments and biological substances status: Secondary | ICD-10-CM | POA: Insufficient documentation

## 2017-07-08 DIAGNOSIS — O26891 Other specified pregnancy related conditions, first trimester: Secondary | ICD-10-CM

## 2017-07-08 DIAGNOSIS — F329 Major depressive disorder, single episode, unspecified: Secondary | ICD-10-CM | POA: Insufficient documentation

## 2017-07-08 DIAGNOSIS — Z885 Allergy status to narcotic agent status: Secondary | ICD-10-CM | POA: Diagnosis not present

## 2017-07-08 DIAGNOSIS — O039 Complete or unspecified spontaneous abortion without complication: Secondary | ICD-10-CM

## 2017-07-08 DIAGNOSIS — Z79899 Other long term (current) drug therapy: Secondary | ICD-10-CM | POA: Diagnosis not present

## 2017-07-08 DIAGNOSIS — Z3A00 Weeks of gestation of pregnancy not specified: Secondary | ICD-10-CM | POA: Insufficient documentation

## 2017-07-08 DIAGNOSIS — R109 Unspecified abdominal pain: Secondary | ICD-10-CM

## 2017-07-08 DIAGNOSIS — O209 Hemorrhage in early pregnancy, unspecified: Secondary | ICD-10-CM

## 2017-07-08 DIAGNOSIS — O208 Other hemorrhage in early pregnancy: Principal | ICD-10-CM

## 2017-07-08 LAB — CBC
HCT: 39.8 % (ref 36.0–46.0)
HEMOGLOBIN: 13.6 g/dL (ref 12.0–15.0)
MCH: 30.3 pg (ref 26.0–34.0)
MCHC: 34.2 g/dL (ref 30.0–36.0)
MCV: 88.6 fL (ref 78.0–100.0)
Platelets: 254 10*3/uL (ref 150–400)
RBC: 4.49 MIL/uL (ref 3.87–5.11)
RDW: 12.5 % (ref 11.5–15.5)
WBC: 5.3 10*3/uL (ref 4.0–10.5)

## 2017-07-08 LAB — HCG, QUANTITATIVE, PREGNANCY: HCG, BETA CHAIN, QUANT, S: 88 m[IU]/mL — AB (ref <5)

## 2017-07-08 NOTE — Progress Notes (Signed)
   Subjective:    Patient ID: Laura [name], female    DOB: 09-05-1995, 22 y.o.   MRN: [id number]  HPI 22 yo single G5P2A3 here for follow up. She was thought to be having another miscarriage but she has been having about 24 hours of fairly intense RLQ pain. She continues to have some vaginal bleeding. Her QBHCG has been falling.  4d ago 6d ago 2wk ago 3wk ago   HCG, Total, QN mIU/mL 1,224  1,369 CM 289 CM 120 CM     Review of Systems     Objective:   Physical Exam  Breathing, conversing, and ambulating normally Well nourished, well hydrated White female, no apparent distress During the bedside [facility]s [facility] with me present, she had distinct tenderness with the ultrasound probe. The uterus appeared to be empty.      Assessment & Plan:  Probable miscarriage, however, I would like her evaluated in the MAU for ruling out an ectopic pregnancy.

## 2017-07-08 NOTE — MAU Note (Signed)
Pt reports she was seen last Tuesday and HCGQ was 1300, u/s showed a gest sac. She states she was bleeding at that time and her quant 2 days later was only 100 lower. Today the pain is worsening

## 2017-07-08 NOTE — MAU Note (Signed)
Urine sent to lab 

## 2017-07-08 NOTE — Discharge Instructions (Signed)

## 2017-07-08 NOTE — MAU Provider Note (Signed)
History     CSN: [id number]  Arrival date and time: 07/08/17 1610   First Provider Initiated Contact with Patient 07/08/17 1639      Chief Complaint  Patient presents with  . Abdominal Pain   HPI Laura [name] is a 22 y.o. [id number] at [redacted]w[redacted]d who presents with abdominal pain. Was sent from the office for evaluation. Had ultrasound in office ([facility]) last week that showed IUGS with YS. Since then she started having bleeding and pain. Vaginal bleeding has resolved but pain has continued. Pain throughout lower abdomen but worse in RLQ. Rates pain 5/10. Has not treated symptoms. Denies n/v/d, dysuria, vaginal discharge, or vaginal bleeding. Had ultrasound in office this afternoon that showed no IUGS. Dr. Marice Potter sent for further evaluation to r/o ectopic pregnancy.   OB History    Gravida  5   Para  2   Term  2   Preterm      AB  2   Living  2     SAB  2   TAB      Ectopic      Multiple  0   Live Births  2           Past Medical History:  Diagnosis Date  . Depression    since 12/18 from [facility]    Past Surgical History:  Procedure Laterality Date  . APPENDECTOMY    . CESAREAN SECTION N/A 12/30/2015   Procedure: CESAREAN SECTION;  Surgeon: Adam Phenix, MD;  Location: [facility] BIRTHING SUITES;  Service: Obstetrics;  Laterality: N/A;  . CHOLECYSTECTOMY      Family History  Problem Relation Age of Onset  . Cancer Maternal Aunt   . Cancer Maternal Uncle   . Diabetes Paternal Aunt   . Diabetes Paternal Uncle   . Diabetes Maternal Grandmother   . Cancer Maternal Grandfather   . Cancer Paternal Grandmother   . Diabetes Paternal Grandmother   . Diabetes Paternal Grandfather     Social History   Tobacco Use  . Smoking status: Former Games developer  . Smokeless tobacco: Former Engineer, water Use Topics  . Alcohol use: No  . Drug use: No    Allergies:  Allergies  Allergen Reactions  . Divalproex Sodium Other (See Comments)    Reaction:  Memory loss   . Demerol  [Meperidine] Hives    Medications Prior to Admission  Medication Sig Dispense Refill Last Dose  . citalopram (CELEXA) 10 MG tablet Take 1 tablet (10 mg total) by mouth daily. For mood control 30 tablet 0 Taking  . hydrOXYzine (ATARAX/VISTARIL) 25 MG tablet Take 1 tablet (25 mg total) by mouth 3 (three) times daily as needed for anxiety. (Patient not taking: Reported on 07/02/2017) 30 tablet 0 Not Taking  . Prenatal Vit-Fe Fumarate-FA (MULTIVITAMIN-PRENATAL) 27-0.8 MG TABS tablet Take 1 tablet by mouth daily at 12 noon.   Taking  . traZODone (DESYREL) 50 MG tablet Take 1 tablet (50 mg total) by mouth at bedtime as needed for sleep. (Patient not taking: Reported on 07/02/2017) 30 tablet 0 Not Taking    Review of Systems  Constitutional: Negative.   Gastrointestinal: Positive for abdominal pain. Negative for constipation, diarrhea, nausea and vomiting.  Genitourinary: Negative.    Physical Exam   Blood pressure 111/72, pulse 66, temperature 98.7 F (37.1 C), resp. rate 17, last menstrual period 12/19/2016, SpO2 99 %, unknown if currently breastfeeding.  Physical Exam  Nursing note and vitals reviewed. Constitutional: She is oriented to  person, place, and time. She appears well-developed and well-nourished. No distress.  HENT:  Head: Normocephalic and atraumatic.  Eyes: Conjunctivae are normal. Right eye exhibits no discharge. Left eye exhibits no discharge. No scleral icterus.  Neck: Normal range of motion.  Respiratory: Effort normal. No respiratory distress.  GI: Soft. There is tenderness in the right lower quadrant. There is no rigidity, no rebound and no guarding.  Neurological: She is alert and oriented to person, place, and time.  Skin: Skin is warm and dry. She is not diaphoretic.  Psychiatric: She has a normal mood and affect. Her behavior is normal. Judgment and thought content normal.    MAU Course  Procedures Results for orders placed or performed during the hospital  encounter of 07/08/17 (from the past 24 hour(s))  CBC     Status: None   Collection Time: 07/08/17  4:56 PM  Result Value Ref Range   WBC 5.3 4.0 - 10.5 K/uL   RBC 4.49 3.87 - 5.11 MIL/uL   Hemoglobin 13.6 12.0 - 15.0 g/dL   HCT 40.939.8 81.136.0 - 91.446.0 %   MCV 88.6 78.0 - 100.0 fL   MCH 30.3 26.0 - 34.0 pg   MCHC 34.2 30.0 - 36.0 g/dL   RDW 78.212.5 95.611.5 - 21.315.5 %   Platelets 254 150 - 400 K/uL  hCG, quantitative, pregnancy     Status: Abnormal   Collection Time: 07/08/17  4:56 PM  Result Value Ref Range   hCG, Beta Chain, Quant, S 88 (H) <5 mIU/mL    MDM CBC, HCG, ultrasound Ultrasound shows no IUP or adnexal mass. HCG has dropped to 88.  Patient reassured by results.  VSS, NAD  Assessment and Plan  A: 1. Miscarriage   2. Abdominal pain during pregnancy in first trimester    P: Discharge home States office is supposed to call her in the morning about follow up appointment Discussed reasons to return to MAU  Judeth HornErin Bently Wyss 07/08/2017, 4:39 PM

## 2017-07-12 ENCOUNTER — Encounter: Payer: Self-pay | Admitting: Advanced Practice Midwife

## 2017-07-15 ENCOUNTER — Ambulatory Visit: Payer: Self-pay | Admitting: Obstetrics & Gynecology

## 2017-07-23 ENCOUNTER — Encounter: Payer: Self-pay | Admitting: Obstetrics & Gynecology

## 2017-07-23 ENCOUNTER — Ambulatory Visit (INDEPENDENT_AMBULATORY_CARE_PROVIDER_SITE_OTHER): Payer: Medicaid Other | Admitting: Obstetrics & Gynecology

## 2017-07-23 VITALS — BP 125/86 | HR 67 | Wt 160.0 lb

## 2017-07-23 DIAGNOSIS — N96 Recurrent pregnancy loss: Secondary | ICD-10-CM

## 2017-07-23 MED ORDER — NORGESTREL-ETHINYL ESTRADIOL 0.3-30 MG-MCG PO TABS: 1.0000 | ORAL_TABLET | Freq: Every day | ORAL | 11 refills | Status: DC

## 2017-07-23 NOTE — Progress Notes (Signed)
   Subjective:    Patient ID: Laura [name], female    DOB: 08/22/1995, 22 y.o.   MRN: [id number]  HPI 22 yo single G5P2A3(spontaneous)- 24 yo and 44 month old kids- same dads. The last 2 miscarriages are with a different man.   Review of Systems She was started on Celexa by her psychiatrist 3/19. She notices some improvement. Endometriosis is common in her family.     Objective:   Physical Exam Breathing, conversing, and ambulating normally Well nourished, well hydrated White female, no apparent distress      Assessment & Plan:  Recurrent miscarriage- order work up labs Repeat [facility] She wants to start OCPs until work up is completed. Lo ovral prescribed. She is aware that this can worsen depression, so if she notices that it is getting worse, she will stop OCPs and use condoms instead.  She will start OCPs with first day of NMP and use back up for another week.

## 2017-07-23 NOTE — Addendum Note (Signed)
Addended by: Kathie Dike on: 07/23/2017 03:21 PM   Modules accepted: Orders

## 2017-07-24 LAB — HCG, QUANTITATIVE, PREGNANCY: HCG, Total, QN: <2 m[IU]/mL

## 2017-07-26 LAB — ANTI-NUCLEAR AB-TITER (ANA TITER): ANA Titer 1: 1:80 {titer} — ABNORMAL HIGH

## 2017-07-26 LAB — ANA, IFA COMPREHENSIVE PANEL
ANA: POSITIVE — AB
ENA SM AB SER-ACNC: NEGATIVE AI
SM/RNP: NEGATIVE AI
SSA (Ro) (ENA) Antibody, IgG: <1 AI
SSB (La) (ENA) Antibody, IgG: <1 AI
Scleroderma (Scl-70) (ENA) Antibody, IgG: <1 AI

## 2017-07-26 LAB — FACTOR 5 LEIDEN

## 2017-07-26 LAB — CARDIOLIPIN ANTIBODIES, IGG, IGM, IGA: Anticardiolipin IgM: <12 [MPL'U]

## 2017-08-01 ENCOUNTER — Encounter: Payer: Self-pay | Admitting: Obstetrics & Gynecology

## 2017-08-05 ENCOUNTER — Encounter: Payer: Self-pay | Admitting: Obstetrics & Gynecology

## 2017-08-05 ENCOUNTER — Ambulatory Visit (INDEPENDENT_AMBULATORY_CARE_PROVIDER_SITE_OTHER): Payer: Medicaid Other | Admitting: Obstetrics & Gynecology

## 2017-08-05 VITALS — BP 111/75 | HR 88 | Wt 158.0 lb

## 2017-08-05 DIAGNOSIS — Z3481 Encounter for supervision of other normal pregnancy, first trimester: Secondary | ICD-10-CM

## 2017-08-05 DIAGNOSIS — Z3490 Encounter for supervision of normal pregnancy, unspecified, unspecified trimester: Secondary | ICD-10-CM

## 2017-08-05 MED ORDER — ASPIRIN 81 MG PO CHEW: 81.0000 mg | CHEWABLE_TABLET | Freq: Every day | ORAL | 3 refills | Status: DC

## 2017-08-05 MED ORDER — PROGESTERONE MICRONIZED 200 MG PO CAPS: ORAL_CAPSULE | ORAL | 3 refills | Status: DC

## 2017-08-05 NOTE — Progress Notes (Signed)
PT had positive pregnancy test this morning

## 2017-08-05 NOTE — Progress Notes (Signed)
   Subjective:    Patient ID: Laura [name], female    DOB: 03-12-1996, 22 y.o.   MRN: [id number]  HPI 22 yo G6 P2 here with a + UPT at home. She had not yet picked up her OCPs. She has had 3 first trimester losses. Her ANA was + recently.  Review of Systems Her maternal GM has lupus.    Objective:   Physical Exam Breathing, conversing, and ambulating normally Well nourished, well hydrated White female, no apparent distress     Assessment & Plan:  Early pregnancy-h/o 3 first trimester losses (2 in a row with new FOB). Start baby asa daily along with prometrium 200 mcg PV qhs for 2 months Come back 4 weeks/prn sooner

## 2017-08-07 ENCOUNTER — Encounter: Payer: Self-pay | Admitting: *Deleted

## 2017-08-14 ENCOUNTER — Other Ambulatory Visit: Payer: Medicaid Other

## 2017-08-14 ENCOUNTER — Encounter: Payer: Self-pay | Admitting: Obstetrics & Gynecology

## 2017-08-14 DIAGNOSIS — Z3200 Encounter for pregnancy test, result unknown: Secondary | ICD-10-CM

## 2017-08-14 NOTE — Addendum Note (Signed)
Addended by: Kathie Dike on: 08/14/2017 03:51 PM   Modules accepted: Orders

## 2017-08-15 LAB — HCG, QUANTITATIVE, PREGNANCY: HCG, TOTAL, QN: 32647 m[IU]/mL

## 2017-08-17 LAB — HCG, QUANTITATIVE, PREGNANCY: HCG, Total, QN: 48737 m[IU]/mL

## 2017-08-20 ENCOUNTER — Encounter: Payer: Self-pay | Admitting: Obstetrics & Gynecology

## 2017-08-21 ENCOUNTER — Ambulatory Visit (INDEPENDENT_AMBULATORY_CARE_PROVIDER_SITE_OTHER): Payer: Medicaid Other | Admitting: Obstetrics & Gynecology

## 2017-08-21 DIAGNOSIS — O209 Hemorrhage in early pregnancy, unspecified: Secondary | ICD-10-CM

## 2017-08-21 DIAGNOSIS — N96 Recurrent pregnancy loss: Secondary | ICD-10-CM | POA: Diagnosis not present

## 2017-08-21 NOTE — Progress Notes (Signed)
   Subjective:    Patient ID: Laura [name], female    DOB: Aug 06, 1995, 22 y.o.   MRN: [id number]  HPI 22 yo G6P2 at about [redacted] weeks EGA here because of some post coital spotting. She is very nervous because of her 3 previous miscarriages. She is taking baby asa due to + ANA and is using vaginal progesterone nightly.   Review of Systems     Objective:   Physical Exam Breathing, conversing, and ambulating normally Well nourished, well hydrated White female, no apparent distress Bedside u/s reveals a normal FHR and a 6 week 5 day fetus     Assessment & Plan:  Early pregnancy with h/o 3 losses- reassurance given Rec pelvic rest Rec NOB at about [redacted] weeks EGA

## 2017-08-26 ENCOUNTER — Telehealth: Payer: Self-pay

## 2017-08-26 DIAGNOSIS — F32A Depression, unspecified: Secondary | ICD-10-CM

## 2017-08-26 DIAGNOSIS — F329 Major depressive disorder, single episode, unspecified: Secondary | ICD-10-CM

## 2017-08-26 MED ORDER — CITALOPRAM HYDROBROMIDE 10 MG PO TABS: 10.0000 mg | ORAL_TABLET | Freq: Every day | ORAL | 6 refills | Status: DC

## 2017-08-26 NOTE — Telephone Encounter (Signed)
PT called this morning stating that she originally got a Rx for Celexa from her psychiatrist but, she doesn't see her anymore and she requests a refill from Dr.Dove. Refill sent per Dr.Dove.

## 2017-09-02 ENCOUNTER — Encounter: Payer: Self-pay | Admitting: Obstetrics & Gynecology

## 2017-09-02 ENCOUNTER — Ambulatory Visit (INDEPENDENT_AMBULATORY_CARE_PROVIDER_SITE_OTHER): Payer: Medicaid Other | Admitting: Obstetrics & Gynecology

## 2017-09-02 ENCOUNTER — Other Ambulatory Visit (HOSPITAL_COMMUNITY)
Admission: RE | Admit: 2017-09-02 | Discharge: 2017-09-02 | Disposition: A | Discharge status: Home / Self Care | Payer: Medicaid Other | Source: Ambulatory Visit | Attending: Obstetrics & Gynecology | Admitting: Obstetrics & Gynecology

## 2017-09-02 DIAGNOSIS — Z3687 Encounter for antenatal screening for uncertain dates: Secondary | ICD-10-CM

## 2017-09-02 DIAGNOSIS — Z3480 Encounter for supervision of other normal pregnancy, unspecified trimester: Secondary | ICD-10-CM

## 2017-09-02 DIAGNOSIS — Z3481 Encounter for supervision of other normal pregnancy, first trimester: Secondary | ICD-10-CM

## 2017-09-02 NOTE — Progress Notes (Signed)
  Subjective:    Laura SheffieldRebecca Bochicchio is being seen today for her first obstetrical visit.  This is a planned pregnancy. She is at 7466w2d gestation. Her obstetrical history is significant for h/o recurrent miscarriages. Relationship with FOB: significant other, living together. Patient does intend to breast feed. Pregnancy history fully reviewed.  Patient reports no complaints.  Review of Systems:   Review of Systems  Objective:     LMP 12/19/2016  Physical Exam  Exam Breathing, conversing, and ambulating normally Well nourished, well hydrated White female, no apparent distress Heart- rrr Lungs- CTAB Abd- benign Bedside u/s shows normal FHR   Assessment:    Pregnancy: [id number] Patient Active Problem List   Diagnosis Date Noted  . 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 Indigestion 05/22/2017  . MDD (major depressive disorder), single episode, severe , no psychosis (HCC) 05/20/2017  . Supervision of other normal pregnancy, antepartum 02/22/2017  . S/P cesarean section 01/01/2016       Plan:     Initial labs drawn. Prenatal vitamins. Problem list reviewed and updated. NIPS at next week Follow up in 4 weeks.   Allie BossierMyra C Dezzie Badilla 09/02/2017

## 2017-09-02 NOTE — Progress Notes (Signed)
Bedside U/S shows single IUP with FHt of 173 BPM and CRL 20.728mm  [id number]

## 2017-09-05 LAB — CULTURE, OB URINE

## 2017-09-05 LAB — CYTOLOGY - PAP
Adequacy: ABSENT — AB
CHLAMYDIA, DNA PROBE: NEGATIVE
Neisseria Gonorrhea: NEGATIVE

## 2017-09-05 LAB — URINE CULTURE, OB REFLEX

## 2017-09-16 ENCOUNTER — Ambulatory Visit (INDEPENDENT_AMBULATORY_CARE_PROVIDER_SITE_OTHER): Payer: Medicaid Other | Admitting: Obstetrics & Gynecology

## 2017-09-16 ENCOUNTER — Encounter: Payer: Self-pay | Admitting: Obstetrics & Gynecology

## 2017-09-16 VITALS — BP 102/62 | HR 66 | Ht 67.0 in | Wt 167.8 lb

## 2017-09-16 DIAGNOSIS — R87612 Low grade squamous intraepithelial lesion on cytologic smear of cervix (LGSIL): Secondary | ICD-10-CM

## 2017-09-16 DIAGNOSIS — Z3480 Encounter for supervision of other normal pregnancy, unspecified trimester: Secondary | ICD-10-CM

## 2017-09-16 NOTE — Progress Notes (Signed)
   Subjective:    Patient ID: Laura [name], female    DOB: 17-Dec-1995, 22 y.o.   MRN: [id number]  HPI  22 yo single G1P0 here for a colpo due to LGSIL pap.   Review of Systems     Objective:   Physical Exam        Assessment & Plan:  Because she is only 22 yo, she will need a repeat pap in 12 months

## 2017-09-26 ENCOUNTER — Encounter: Payer: Self-pay | Admitting: Advanced Practice Midwife

## 2017-09-30 ENCOUNTER — Encounter: Payer: Self-pay | Admitting: Advanced Practice Midwife

## 2017-09-30 ENCOUNTER — Ambulatory Visit (INDEPENDENT_AMBULATORY_CARE_PROVIDER_SITE_OTHER): Payer: Medicaid Other | Admitting: Advanced Practice Midwife

## 2017-09-30 VITALS — BP 116/71 | HR 68 | Wt 170.0 lb

## 2017-09-30 DIAGNOSIS — Z3480 Encounter for supervision of other normal pregnancy, unspecified trimester: Secondary | ICD-10-CM | POA: Diagnosis not present

## 2017-09-30 DIAGNOSIS — Z3481 Encounter for supervision of other normal pregnancy, first trimester: Secondary | ICD-10-CM

## 2017-09-30 NOTE — Progress Notes (Deleted)
   PRENATAL VISIT NOTE  Subjective:  Laura [name] is a 22 y.o. [phone] at 5233w2d being seen today for ongoing prenatal care.  She is currently monitored for the following issues for this {Blank single:19197::"high-risk","low-risk"} pregnancy and has S/P cesarean section; Supervision of other normal pregnancy, antepartum; MDD (major depressive disorder), single episode, severe , no psychosis (HCC); Indigestion; and Recurrent pregnancy loss on their problem list.  Patient reports {sx:14538}.   . Vag. Bleeding: None.  Movement: Absent. Denies leaking of fluid.   The following portions of the patient's history were reviewed and updated as appropriate: allergies, current medications, past family history, past medical history, past social history, past surgical history and problem list. Problem list updated.  Objective:   Vitals:   09/30/17 1015  BP: 116/71  Pulse: 68  Weight: 170 lb (77.1 kg)    Fetal Status: Fetal Heart Rate (bpm): 134   Movement: Absent     General:  Alert, oriented and cooperative. Patient is in no acute distress.  Skin: Skin is warm and dry. No rash noted.   Cardiovascular: Normal heart rate noted  Respiratory: Normal respiratory effort, no problems with respiration noted  Abdomen: Soft, gravid, appropriate for gestational age.  Pain/Pressure: Absent     Pelvic: {Blank single:19197::"Cervical exam performed","Cervical exam deferred"}        Extremities: Normal range of motion.  Edema: None  Mental Status: Normal mood and affect. Normal behavior. Normal judgment and thought content.   Assessment and Plan:  Pregnancy: [id number] at 6733w2d  1. Supervision of other normal pregnancy, antepartum *** - Genetic Screening  {Blank single:19197::"Term","Preterm"} labor symptoms and general obstetric precautions including but not limited to vaginal bleeding, contractions, leaking of fluid and fetal movement were reviewed in detail with the patient. Please refer to After  Visit Summary for other counseling recommendations.  No follow-ups on file.  Future Appointments  Date Time Provider Department Center  10/28/2017 10:00 AM Donette LarryBhambri, Melanie, CNM [facility]    Wynelle BourgeoisMarie Ben Sanz, CNM

## 2017-09-30 NOTE — Patient Instructions (Signed)
Second Trimester of Pregnancy The second trimester is from week 13 through week 28, month 4 through 6. This is often the time in pregnancy that you feel your best. Often times, morning sickness has lessened or quit. You may have more energy, and you may get hungry more often. Your unborn baby (fetus) is growing rapidly. At the end of the sixth month, he or she is about 9 inches long and weighs about 1 pounds. You will likely feel the baby move (quickening) between 18 and 20 weeks of pregnancy. Follow these instructions at home:  Avoid all smoking, herbs, and alcohol. Avoid drugs not approved by your doctor.  Do not use any tobacco products, including cigarettes, chewing tobacco, and electronic cigarettes. If you need help quitting, ask your doctor. You may get counseling or other support to help you quit.  Only take medicine as told by your doctor. Some medicines are safe and some are not during pregnancy.  Exercise only as told by your doctor. Stop exercising if you start having cramps.  Eat regular, healthy meals.  Wear a good support bra if your breasts are tender.  Do not use hot tubs, steam rooms, or saunas.  Wear your seat belt when driving.  Avoid raw meat, uncooked cheese, and liter boxes and soil used by cats.  Take your prenatal vitamins.  Take 1500-2000 milligrams of calcium daily starting at the 20th week of pregnancy until you deliver your baby.  Try taking medicine that helps you poop (stool softener) as needed, and if your doctor approves. Eat more fiber by eating fresh fruit, vegetables, and whole grains. Drink enough fluids to keep your pee (urine) clear or pale yellow.  Take warm water baths (sitz baths) to soothe pain or discomfort caused by hemorrhoids. Use hemorrhoid cream if your doctor approves.  If you have puffy, bulging veins (varicose veins), wear support hose. Raise (elevate) your feet for 15 minutes, 3-4 times a day. Limit salt in your diet.  Avoid heavy  lifting, wear low heals, and sit up straight.  Rest with your legs raised if you have leg cramps or low back pain.  Visit your dentist if you have not gone during your pregnancy. Use a soft toothbrush to brush your teeth. Be gentle when you floss.  You can have sex (intercourse) unless your doctor tells you not to.  Go to your doctor visits. Get help if:  You feel dizzy.  You have mild cramps or pressure in your lower belly (abdomen).  You have a nagging pain in your belly area.  You continue to feel sick to your stomach (nauseous), throw up (vomit), or have watery poop (diarrhea).  You have bad smelling fluid coming from your vagina.  You have pain with peeing (urination). Get help right away if:  You have a fever.  You are leaking fluid from your vagina.  You have spotting or bleeding from your vagina.  You have severe belly cramping or pain.  You lose or gain weight rapidly.  You have trouble catching your breath and have chest pain.  You notice sudden or extreme puffiness (swelling) of your face, hands, ankles, feet, or legs.  You have not felt the baby move in over an hour.  You have severe headaches that do not go away with medicine.  You have vision changes. This information is not intended to replace advice given to you by your health care provider. Make sure you discuss any questions you have with your health care   provider. Document Released: 05/30/2009 Document Revised: 08/11/2015 Document Reviewed: 05/06/2012 Elsevier Interactive Patient Education  2017 Elsevier Inc.  

## 2017-09-30 NOTE — Progress Notes (Signed)
   PRENATAL VISIT NOTE  Subjective:  Laura [name] is a 22 y.o. [phone] at 4042w2d being seen today for ongoing prenatal care.  She is currently monitored for the following issues for this high-risk pregnancy and has S/P cesarean section; Supervision of other normal pregnancy, antepartum; MDD (major depressive disorder), single episode, severe , no psychosis (HCC); Indigestion; and Recurrent pregnancy loss on their problem list.  Patient reports no complaints.   . Vag. Bleeding: None.  Movement: Absent. Denies leaking of fluid.   The following portions of the patient's history were reviewed and updated as appropriate: allergies, current medications, past family history, past medical history, past social history, past surgical history and problem list. Problem list updated.  Objective:   Vitals:   09/30/17 1015  BP: 116/71  Pulse: 68  Weight: 170 lb (77.1 kg)    Fetal Status: Fetal Heart Rate (bpm): 134   Movement: Absent     General:  Alert, oriented and cooperative. Patient is in no acute distress.  Skin: Skin is warm and dry. No rash noted.   Cardiovascular: Normal heart rate noted  Respiratory: Normal respiratory effort, no problems with respiration noted  Abdomen: Soft, gravid, appropriate for gestational age.  Pain/Pressure: Absent     Pelvic: Cervical exam deferred        Extremities: Normal range of motion.  Edema: None  Mental Status: Normal mood and affect. Normal behavior. Normal judgment and thought content.   Assessment and Plan:  Pregnancy: [id number] at 4142w2d  1. Supervision of other normal pregnancy, antepartum      Wants to start making birth plan.  Wants low intervention . Would like to labor in water, discussed depends on monitoring needs.  Knows she can't do waterbirth       - Genetic Screening  Preterm labor symptoms and general obstetric precautions including but not limited to vaginal bleeding, contractions, leaking of fluid and fetal movement were  reviewed in detail with the patient. Please refer to After Visit Summary for other counseling recommendations.    Future Appointments  Date Time Provider Department Center  10/28/2017 10:00 AM Donette LarryBhambri, Melanie, CNM [facility]    Laura [name], CNM

## 2017-10-01 LAB — OBSTETRIC PANEL
Antibody Screen: NOT DETECTED
BASOS PCT: 0.7 %
Basophils Absolute: 41 cells/uL (ref 0–200)
EOS ABS: 29 {cells}/uL (ref 15–500)
Eosinophils Relative: 0.5 %
HCT: 41.2 % (ref 35.0–45.0)
Hemoglobin: 13.7 g/dL (ref 11.7–15.5)
Hepatitis B Surface Ag: NONREACTIVE
Lymphs Abs: 1612 cells/uL (ref 850–3900)
MCH: 30.9 pg (ref 27.0–33.0)
MCHC: 33.3 g/dL (ref 32.0–36.0)
MCV: 93 fL (ref 80.0–100.0)
MPV: 10.7 fL (ref 7.5–12.5)
Monocytes Relative: 5 %
Neutro Abs: 3828 cells/uL (ref 1500–7800)
Neutrophils Relative %: 66 %
Platelets: 245 10*3/uL (ref 140–400)
RBC: 4.43 10*6/uL (ref 3.80–5.10)
RDW: 12.2 % (ref 11.0–15.0)
RPR: NONREACTIVE
Rubella: 3.36 index
Total Lymphocyte: 27.8 %
WBC: 5.8 10*3/uL (ref 3.8–10.8)
WBCMIX: 290 {cells}/uL (ref 200–950)

## 2017-10-01 LAB — HIV ANTIBODY (ROUTINE TESTING W REFLEX): HIV: NONREACTIVE

## 2017-10-10 ENCOUNTER — Encounter: Payer: Self-pay | Admitting: *Deleted

## 2017-10-10 DIAGNOSIS — Z3480 Encounter for supervision of other normal pregnancy, unspecified trimester: Secondary | ICD-10-CM

## 2017-10-28 ENCOUNTER — Ambulatory Visit (INDEPENDENT_AMBULATORY_CARE_PROVIDER_SITE_OTHER): Payer: Medicaid Other | Admitting: Certified Nurse Midwife

## 2017-10-28 VITALS — BP 102/65 | HR 74 | Wt 177.0 lb

## 2017-10-28 DIAGNOSIS — Z98891 History of uterine scar from previous surgery: Secondary | ICD-10-CM

## 2017-10-28 DIAGNOSIS — Z3480 Encounter for supervision of other normal pregnancy, unspecified trimester: Secondary | ICD-10-CM | POA: Diagnosis not present

## 2017-10-28 DIAGNOSIS — M549 Dorsalgia, unspecified: Secondary | ICD-10-CM

## 2017-10-28 DIAGNOSIS — O9989 Other specified diseases and conditions complicating pregnancy, childbirth and the puerperium: Secondary | ICD-10-CM

## 2017-10-28 DIAGNOSIS — O99891 Other specified diseases and conditions complicating pregnancy: Secondary | ICD-10-CM | POA: Insufficient documentation

## 2017-10-28 DIAGNOSIS — O34219 Maternal care for unspecified type scar from previous cesarean delivery: Secondary | ICD-10-CM | POA: Insufficient documentation

## 2017-10-28 DIAGNOSIS — Z8659 Personal history of other mental and behavioral disorders: Secondary | ICD-10-CM

## 2017-10-28 MED ORDER — COMFORT FIT MATERNITY SUPP MED MISC: 1.0000 | Freq: Every day | 0 refills | Status: DC

## 2017-10-28 MED ORDER — CYCLOBENZAPRINE HCL 10 MG PO TABS: 10.0000 mg | ORAL_TABLET | Freq: Three times a day (TID) | ORAL | 0 refills | Status: DC | PRN

## 2017-10-28 MED ORDER — CITALOPRAM HYDROBROMIDE 20 MG PO TABS: 20.0000 mg | ORAL_TABLET | Freq: Every day | ORAL | 3 refills | Status: DC

## 2017-10-28 NOTE — Progress Notes (Signed)
Subjective:  Purvis SheffieldRebecca Boger is a 22 y.o. [phone] at 403w2d being seen today for ongoing prenatal care.  She is currently monitored for the following issues for this low-risk pregnancy and has S/P cesarean section; Supervision of other normal pregnancy, antepartum; MDD (major depressive disorder), single episode, severe , no psychosis (HCC); Indigestion; Recurrent pregnancy loss; and History of postpartum depression, currently pregnant on their problem list.  Patient reports backache and dizzy.  Contractions: Not present. Vag. Bleeding: None.  Movement: Absent. Denies leaking of fluid.   Having low back pain at the end of the day, works at [employer], on feet. Feeling dizzy at times. The following portions of the patient's history were reviewed and updated as appropriate: allergies, current medications, past family history, past medical history, past social history, past surgical history and problem list. Problem list updated.  Objective:   Vitals:   10/28/17 1007  BP: 102/65  Pulse: 74  Weight: 80.3 kg    Fetal Status: Fetal Heart Rate (bpm): 128 Fundal Height: 16 cm Movement: Absent     General:  Alert, oriented and cooperative. Patient is in no acute distress.  Skin: Skin is warm and dry. No rash noted.   Cardiovascular: Normal heart rate noted  Respiratory: Normal respiratory effort, no problems with respiration noted  Abdomen: Soft, gravid, appropriate for gestational age. Pain/Pressure: Absent     Pelvic: Vag. Bleeding: None Vag D/C Character: Thin   Cervical exam deferred        Extremities: Normal range of motion.  Edema: None  Mental Status: Normal mood and affect. Normal behavior. Normal judgment and thought content.   Urinalysis:      Assessment and Plan:  Pregnancy: [id number] at 543w2d  1. Supervision of other normal pregnancy, antepartum - Alpha fetoprotein, maternal - POC Urinalysis Dipstick OB - US MFM OB COMP + 14 WK; Future  2. History of postpartum depression,  currently pregnant - follow closely postpartum, continue Celexa  3. S/P cesarean section - desires TOLAC  4. Back pain affecting pregnancy in second trimester - heat, maternity belt, Flexeril  Preterm labor symptoms and general obstetric precautions including but not limited to vaginal bleeding, contractions, leaking of fluid and fetal movement were reviewed in detail with the patient. Please refer to After Visit Summary for other counseling recommendations.  Return in about 4 weeks (around 11/25/2017).   Donette LarryBhambri, Loyda Costin, CNM

## 2017-10-28 NOTE — Patient Instructions (Signed)
Psychiatric Services °[facility]  °[address], [place], [place] °[phone] ° °[facility] °[address], [place], [place] °Helpline 336-832-9700 or 1-800-711-2635 °[email or web address] ° °[facility] °Walk-in Mon-Fri, 8:30-5:00 °[address], [place], [place] °[phone] °www.[email or web address]  °*Bring your own interpreter at 1st visit ° °[facility] °[address], [place], [place] °[phone] °www.[email or web address]  ° °[facility]  °[address], [place], [place] °[phone] ° °[facility] °Walk-in Mon-Fri, 8am-3pm °[address], [place], [place] °[phone] °www.[email or web address] ° °Therapy/Counseling ° °[facility] °[address], [place], [place] °[phone] ° °[facility] °[address], [place], [place] °[phone] ° °[facility] °[address], [place], [place] °[phone] °*pacientes que hablen espanol, favor comunicarse con el Sr. [place], extension 2244 o la [address], extension 3331 para hacer una cita.  ° °[facility] °[address] [place]” °[phone] (Habla Espanol) ° °[facility] °[address], [place], [place] °[phone] ° °[facility] °[address], [place], [place] °[phone] °  °[facility]  °[address], [place], [place] °[phone] °www.[email or web address].org  °*Uninsured and underinsured, ages 19-64 ° °[facility] °[address], [place], [place] °[phone] (Habla Espanol) ° °[facility] °[address], [place], [place] °[phone] (Habla Espanol) ° °[facility] °[address], [place], [place] °[phone] (Habla  Espanol) ° °[facility] °Mon-Thurs 8:30am-8:00pm/ Fri 8:30-7:00pm °[address], [place], [place] °(3rd floor, located at corner of [address]) °Call [phone] to schedule an appointment °[email or web address] ° °[facility] °[address], [place], [place]  °336-542-2884 ° °[facility]  °[address], [place], [place] °336-375-8333 ° °Social Support °[facility] °(336) 373-1402 or www.[facility].org °[address], [place], [place] [address] °* Recovery support and educational programs, including recovery skills classes, support groups, and one-on-one sessions with [place] Certified Peer Support Specialists.  ° ° °[facility] ([employer] [facility]) [place] °[facility]MI Helpline: (336) 370-4264 °* Family and Friends Support Group/  Contact Jack Glenn at [phone] for more information °* Family to Family Education Program and Basics Class : enroll online or email Mary Pennington at [email or web address]  °* Monthly educational meetings, contact Mitch McGee at [phone] °[email or web address]  ° ° °24- Hour Availability:  °*[facility] 336-832-9700 or 1-800-711-2635 ° °* [facility] Crisis (Domestic Violence, Rape, Victim Assistance) Line 336-273-7273 ° °* [facility] 1-855-788-8787 or 336-676-6840 ° °* [facility] [place] [facility]  °336-899-1505(8am-4pm only) °1-866-261-5769(after hours) ° °*[facility] °1-877-626-1772 ° °*[place] National Suicide Hotline °1-800-273-8255 ° ° °

## 2017-10-28 NOTE — Progress Notes (Signed)
Pt c/o back pain, feeling dizzy and eye twitching constantly. Pt also needs Rx for Celexa fixed.

## 2017-10-29 LAB — ALPHA FETOPROTEIN, MATERNAL
AFP MOM: 0.83
AFP, SERUM: 25.7 ng/mL
Calc'd Gestational Age: 16.3 weeks
Maternal Wt: 170 [lb_av]
Risk for ONTD: <1
Twins-AFP: 1

## 2017-10-30 ENCOUNTER — Inpatient Hospital Stay (HOSPITAL_COMMUNITY)
Admission: AD | Admit: 2017-10-30 | Discharge: 2017-10-30 | Disposition: A | Discharge status: Home / Self Care | Payer: Medicaid Other | Source: Ambulatory Visit | Attending: Obstetrics and Gynecology | Admitting: Obstetrics and Gynecology

## 2017-10-30 ENCOUNTER — Encounter (HOSPITAL_COMMUNITY): Payer: Self-pay

## 2017-10-30 ENCOUNTER — Other Ambulatory Visit: Payer: Self-pay

## 2017-10-30 DIAGNOSIS — O26892 Other specified pregnancy related conditions, second trimester: Secondary | ICD-10-CM | POA: Diagnosis not present

## 2017-10-30 DIAGNOSIS — G44209 Tension-type headache, unspecified, not intractable: Secondary | ICD-10-CM | POA: Insufficient documentation

## 2017-10-30 DIAGNOSIS — Z3492 Encounter for supervision of normal pregnancy, unspecified, second trimester: Secondary | ICD-10-CM

## 2017-10-30 DIAGNOSIS — F329 Major depressive disorder, single episode, unspecified: Secondary | ICD-10-CM | POA: Insufficient documentation

## 2017-10-30 DIAGNOSIS — O99342 Other mental disorders complicating pregnancy, second trimester: Secondary | ICD-10-CM | POA: Diagnosis not present

## 2017-10-30 DIAGNOSIS — F339 Major depressive disorder, recurrent, unspecified: Secondary | ICD-10-CM | POA: Diagnosis not present

## 2017-10-30 DIAGNOSIS — Z7982 Long term (current) use of aspirin: Secondary | ICD-10-CM | POA: Insufficient documentation

## 2017-10-30 DIAGNOSIS — Z8659 Personal history of other mental and behavioral disorders: Secondary | ICD-10-CM

## 2017-10-30 DIAGNOSIS — O9989 Other specified diseases and conditions complicating pregnancy, childbirth and the puerperium: Secondary | ICD-10-CM

## 2017-10-30 DIAGNOSIS — O34219 Maternal care for unspecified type scar from previous cesarean delivery: Secondary | ICD-10-CM

## 2017-10-30 DIAGNOSIS — Z87891 Personal history of nicotine dependence: Secondary | ICD-10-CM | POA: Diagnosis not present

## 2017-10-30 DIAGNOSIS — Z3A16 16 weeks gestation of pregnancy: Secondary | ICD-10-CM | POA: Diagnosis not present

## 2017-10-30 HISTORY — DX: Post-traumatic stress disorder, unspecified: F43.10

## 2017-10-30 HISTORY — DX: Anxiety disorder, unspecified: F41.9

## 2017-10-30 MED ORDER — ACETAMINOPHEN 500 MG PO TABS
1000.0000 mg | ORAL_TABLET | Freq: Once | ORAL | Status: AC
  Administered 2017-10-30: 1000 mg via ORAL
  Filled 2017-10-30: qty 2

## 2017-10-30 NOTE — Consult Note (Signed)
  Tele Assessment   Laura Sheffieldebecca Tinoco, 22 y.o., female patient seen via telepsych by TTS and this provider; chart reviewed and consulted with Dr. Lucianne MussKumar on 10/30/17.  On evaluation Laura [name] reports that she is having worsening depression and is seeking outpatient psychiatric services.  Patient denies suicidal/self-harm/homicidal ideation, psychosis, and paranoia.  Patient states that her OB provider is also aware of her depression and she has an appointment tomorrow.   During evaluation Laura SheffieldRebecca Faddis is alert/oriented x 4; calm/cooperative; and mood congruent with affect.  She does not appear to be responding to internal/external stimuli or delusional thoughts.  Patient denies suicidal/self-harm/homicidal ideation, psychosis, and paranoia.  Patient answered question appropriately.     For detailed note see TTS tele assessment note   Recommendations:  Outpatient psychiatric services  Disposition:  Patient psychiatrically cleared No evidence of imminent risk to self or others at present.   Patient does not meet criteria for psychiatric inpatient admission. Supportive therapy provided about ongoing stressors. Discussed crisis plan, support from social network, calling 911, coming to the Emergency Department, and calling Suicide Hotline.    Shuvon B. Rankin, NP

## 2017-10-30 NOTE — MAU Provider Note (Addendum)
History     CSN: [id number]  Arrival date and time: 10/30/17 1428   First Provider Initiated Contact with Patient 10/30/17 1532      Chief Complaint  Patient presents with  . Depression   HPI Purvis SheffieldRebecca Troost is a 22 y.o. [id number] Caucasian female at 4850w4d with history of MDD with admission, PPD, PTSD, and anxiety complaining of worsening depression. She reports that over the last week she "can feel the bad depression coming back." She reports she ran out of Celexa one week ago and had not been able to get a new prescription until this week.   [facility] for suicidal ideation and states she had been feeling back to normal since then until this week. Today, she adamantly denies suicidal or homicidal ideation and wants to get help before it gets worse. She has a scheduled counseling session tomorrow which she plans to attend. She is open to telepsych, social work, or readmission into [facility].   She is concerned for price of counseling following pregnancy due to only having pregnancy medicare. 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 She denies an unsafe living situation.   She reports a mild HA for which she requests Tylenol. Pain is frontal, bilateral, 1/10, does not radiate. She denies any other complaints, including abdominal pain, vision changes, vaginal bleeding, discharge, contractions, or SOB.  OB History    Gravida  6   Para  2   Term  2   Preterm      AB  2   Living  2     SAB  2   TAB      Ectopic      Multiple  0   Live Births  2           Past Medical History:  Diagnosis Date  . Anxiety   . Depression    since 12/18 from [facility]  . PTSD (post-traumatic stress disorder) 2019    Past Surgical History:  Procedure Laterality Date  . APPENDECTOMY    . CESAREAN SECTION N/A 12/30/2015   Procedure: CESAREAN SECTION;   Surgeon: Adam PhenixJames G Arnold, MD;  [facility];  Service: Obstetrics;  Laterality: N/A;  . CHOLECYSTECTOMY      Family History  Problem Relation Age of Onset  . Cancer Maternal Aunt   . Cancer Maternal Uncle   . Diabetes Paternal Aunt   . Diabetes Paternal Uncle   . Diabetes Maternal Grandmother   . Cancer Maternal Grandfather   . Cancer Paternal Grandmother   . Diabetes Paternal Grandmother   . Diabetes Paternal Grandfather     Social History   Tobacco Use  . Smoking status: Former Games developermoker  . Smokeless tobacco: Former Engineer, waterUser  Substance Use Topics  . Alcohol use: No  . Drug use: No    Allergies:  Allergies  Allergen Reactions  . Divalproex Sodium Other (See Comments)    Reaction:  Memory loss   . Demerol [Meperidine] Hives    Medications Prior to Admission  Medication Sig Dispense Refill Last Dose  . aspirin 81 MG chewable tablet Chew 1 tablet (81 mg total) by mouth daily. 60 tablet 3 10/29/2017 at Unknown time  . citalopram (CELEXA) 20 MG tablet Take 1 tablet (20 mg total) by mouth daily. 30 tablet 3 10/29/2017 at Unknown time  . cyclobenzaprine (FLEXERIL)  10 MG tablet Take 1 tablet (10 mg total) by mouth every 8 (eight) hours as needed for muscle spasms. 30 tablet 0 Past Week at Unknown time  . Prenatal Vit-Fe Fumarate-FA (MULTIVITAMIN-PRENATAL) 27-0.8 MG TABS tablet Take 1 tablet by mouth daily at 12 noon.   10/29/2017 at Unknown time  . citalopram (CELEXA) 10 MG tablet Take 1 tablet (10 mg total) by mouth daily. For mood control 30 tablet 6 Taking  . Elastic Bandages & Supports (COMFORT FIT MATERNITY SUPP MED) MISC 1 Device by Does not apply route daily. 1 each 0     Review of Systems  Constitutional: Negative for activity change, chills and fever.  Eyes: Negative for visual disturbance.  Respiratory: Negative for chest tightness and shortness of breath.   Cardiovascular: Negative for chest pain, palpitations and leg swelling.  Gastrointestinal: Negative  for abdominal distention, abdominal pain, nausea and vomiting.  Genitourinary: Negative for vaginal bleeding and vaginal discharge.  Neurological: Positive for headaches.  Psychiatric/Behavioral: Positive for dysphoric mood. Negative for self-injury and suicidal ideas.   Physical Exam   Blood pressure 122/74, pulse 86, temperature 98.6 F (37 C), temperature source Oral, resp. rate 18, weight 81.3 kg, last menstrual period 12/19/2016, SpO2 98 %, unknown if currently breastfeeding.  Physical Exam  Constitutional: She is oriented to person, place, and time. She appears well-developed and well-nourished. She appears distressed.  HENT:  Head: Normocephalic and atraumatic.  Cardiovascular: Normal rate and regular rhythm.  Respiratory: Effort normal and breath sounds normal. No respiratory distress.  GI: Soft. Bowel sounds are normal. She exhibits no distension and no mass. There is no tenderness.  Musculoskeletal: Normal range of motion.  Neurological: She is alert and oriented to person, place, and time.  Skin: Skin is warm and dry.  Psychiatric: Her behavior is normal. Judgment and thought content normal.  Depressed mood. Tearful but very pleasant and cooperative.    MAU Course   MDM Patient open to multiple routes of treatment, including Celexa, telepsych, counseling, and readmission [facility]. She does not foresee any difficulty filling the prescription, getting to her appointments, or getting to [facility].  Headache relieved by Tylenol given in MAU  Assessment and Plan  1. Major depressive disorder  - Telepsych consult  - Patient will continue Celexa.  - Patient plans [facility]  - Instructed to return for worsening or new symptoms or SI/HI  2. Tension headache  - Tylenol, 1000mg , PO  Margarita RanaHarrison D Rose 10/30/2017, 4:30 PM   STUDENT ADDENDUM I have separately seen and examined the patient. I have discussed the findings  and exam with the medical student and agree with the above note. I helped develop the management plan that is described in the student's note, and I agree with the content.  Patient states multiple times that she does not have thoughts of harming self or others, no thoughts of suicide. [facility] regarding plan for outpatient management. Conference with patient, her boyfriend and her mother to discuss normal fluctuation of emotions and importance of being responsive to patient statements about needing care immediately.    FHT [id number]  Samantha Weinhold, [place] 10/30/17  7:55 PM

## 2017-10-30 NOTE — MAU Note (Signed)
Suffering from depression, not having any bad thoughts, but doesn't want it to get to that.  Dosage was wrong on meds, so was doubling and ran out. Having a tough time this month, this was when her child was due.  Called office, requesting additional help- was told to come in .  Is to meet with counselor tomorrow.

## 2017-10-30 NOTE — Addendum Note (Signed)
Addended by: Kathie DikeSOLA, Ivett Luebbe J on: 10/30/2017 10:33 AM   Modules accepted: Orders

## 2017-10-30 NOTE — Discharge Instructions (Signed)
Perinatal Anxiety  When a woman feels excessive tension or worry (anxiety) during pregnancy or during the first 12 months after she gives birth, she has a condition called perinatal anxiety. Anxiety can interfere with work, school, relationships, and other everyday activities. If it is not managed properly, it can also cause problems in the mother and her baby.   If you are pregnant and you have symptoms of an anxiety disorder, it is important to talk with your health care provider.  What are the causes?  The exact cause of this condition is not known. Hormonal changes during and after pregnancy may play a role in causing perinatal anxiety.  What increases the risk?  You are more likely to develop this condition if:   You have a personal or family history of depression, anxiety, or mood disorders.   You experience a stressful life event during pregnancy, such as the death of a loved one.   You have a lot of regular life stress, such as being a single parent.   You have thyroid problems.    What are the signs or symptoms?  Perinatal anxiety can be different for everyone. It may include:   Panic attacks (panic disorder). These are intense episodes of fear or discomfort that may also cause sweating, nausea, shortness of breath, or fear of dying. They usually last 5-15 minutes.   Reliving an upsetting (traumatic) event through distressing thoughts, dreams, or flashbacks (post-traumatic stress disorder, or PTSD).   Excessive worry about multiple problems (generalized anxiety disorder).   Fear and stress about leaving certain people or loved ones (separation anxiety).   Performing repetitive tasks (compulsions) to relieve stress or worry (obsessive compulsive disorder, or OCD).   Fear of certain objects or situations (phobias).   Excessive worrying, such as a constant feeling that something bad is going to happen.   Inability to relax.   Difficulty concentrating.   Sleep problems.   Frequent nightmares or  disturbing thoughts.    How is this diagnosed?  This condition is diagnosed based on a physical exam and mental evaluation. In some cases, your health care provider may use an anxiety screening tool. These tools include a list of questions that can help a health care provider diagnose anxiety. Your health care provider may refer you to a mental health expert who specializes in anxiety.  How is this treated?  This condition may be treated with:   Medicines. Your health care provider will only give you medicines that have been proven safe for pregnancy and breastfeeding.   Talk therapy with a mental health professional to help change your patterns of thinking (cognitive behavioral therapy).   Mindfulness-based stress reduction.   Other relaxation therapies, such as deep breathing or guided muscle relaxation.   Support groups.    Follow these instructions at home:  Lifestyle   Do not use any products that contain nicotine or tobacco, such as cigarettes and e-cigarettes. If you need help quitting, ask your health care provider.   Do not use alcohol when you are pregnant. After your baby is born, limit alcohol intake to no more than 1 drink a day. One drink equals 12 oz of beer, 5 oz of wine, or 1 oz of hard liquor.   Consider joining a support group for new mothers. Ask your health care provider for recommendations.   Take good care of yourself. Make sure you:  ? Get plenty of sleep. If you are having trouble sleeping, talk with your health   care provider.  ? Eat a healthy diet. This includes plenty of fruits and vegetables, whole grains, and lean proteins.  ? Exercise regularly, as told by your health care provider. Ask your health care provider what exercises are safe for you.  General instructions   Take over-the-counter and prescription medicines only as told by your health care provider.   Talk with your partner or family members about your feelings during pregnancy. Share any concerns or fears that you  may have.   Ask for help with tasks or chores when you need it. Ask friends and family members to provide meals, watch your children, or help with cleaning.   Keep all follow-up visits as told by your health care provider. This is important.  Contact a health care provider if:   You (or people close to you) notice that you have any symptoms of anxiety or depression.   You have anxiety and your symptoms get worse.   You experience side effects from medicines, such as nausea or sleep problems.  Get help right away if:   You feel like hurting yourself, your baby, or someone else.  If you ever feel like you may hurt yourself or others, or have thoughts about taking your own life, get help right away. You can go to your nearest emergency department or call:   Your local emergency services (911 in the [place]   A suicide crisis helpline, such as the National Suicide Prevention Lifeline at 1-800-273-8255. This is open 24 hours a day.    Summary   Perinatal anxiety is when a woman feels excessive tension or worry during pregnancy or during the first 12 months after she gives birth.   Perinatal anxiety may include panic attacks, post-traumatic stress disorder, separation anxiety, phobias, or generalized anxiety.   Perinatal anxiety can cause physical health problems in the mother and baby if not properly managed.   This condition is treated with medicines, talk therapy, stress reduction therapies, or a combination of two or more treatments.   Talk with your partner or family members about your concerns or fears. Do not be afraid to ask for help.  This information is not intended to replace advice given to you by your health care provider. Make sure you discuss any questions you have with your health care provider.  Document Released: 05/02/2016 Document Revised: 05/02/2016 Document Reviewed: 05/02/2016  Elsevier Interactive Patient Education  2018 Elsevier Inc.

## 2017-10-30 NOTE — BH Assessment (Signed)
Tele Assessment Note   Patient Name: Laura [name] MRN: [id number] Referring Physician: Calvert Cantor, CNM Location of Patient: [facility] Location of Provider: Behavioral Health TTS Department  Laura [name] is an 22 y.o. female. Pt denies SI/HI and AVH. Pt denied previous SI attempts. Pt reports worsening depression. Pt states she's had depression since childhood but it has worsened the last 2 months. The Pt states she is not receiving outpatient treatment, and she would like to see a therapist and psychiatrist. Pt denies previous inpatient treatment. Per Pt she is currently prescribed Celexa. Pt denies SA.   Shuvon, NP recommends D/C and outpatient resources. Outpatient resources faxed.  Diagnosis:  F32.9 Unspecified depressive disorder  Past Medical History:  Past Medical History:  Diagnosis Date  . Anxiety   . Depression    since 12/18 from [facility]  . PTSD (post-traumatic stress disorder) 2019    Past Surgical History:  Procedure Laterality Date  . APPENDECTOMY    . CESAREAN SECTION N/A 12/30/2015   Procedure: CESAREAN SECTION;  Surgeon: Adam Phenix, MD;  Location: [facility] BIRTHING SUIT[facility];  Service: Obstetrics;  Laterality: N/A;  . CHOLECYSTECTOMY      Family History:  Family History  Problem Relation Age of Onset  . Cancer Maternal Aunt   . Cancer Maternal Uncle   . Diabetes Paternal Aunt   . Diabetes Paternal Uncle   . Diabetes Maternal Grandmother   . Cancer Maternal Grandfather   . Cancer Paternal Grandmother   . Diabetes Paternal Grandmother   . Diabetes Paternal Grandfather     Social History:  reports that she has quit smoking. She has quit using smokeless tobacco. She reports that she does not drink alcohol or use drugs.  Additional Social History:  Alcohol / Drug Use Pain Medications: please see mar Prescriptions: please see mar Over the Counter: please see mar History of alcohol / drug use?: No history of alcohol / drug  abuse Longest period of sobriety (when/how long): NA  CIWA: CIWA-Ar BP: 122/74 Pulse Rate: 86 COWS:    Allergies:  Allergies  Allergen Reactions  . Divalproex Sodium Other (See Comments)    Reaction:  Memory loss   . Demerol [Meperidine] Hives    Home Medications:  Medications Prior to Admission  Medication Sig Dispense Refill  . aspirin 81 MG chewable tablet Chew 1 tablet (81 mg total) by mouth daily. 60 tablet 3  . citalopram (CELEXA) 20 MG tablet Take 1 tablet (20 mg total) by mouth daily. 30 tablet 3  . cyclobenzaprine (FLEXERIL) 10 MG tablet Take 1 tablet (10 mg total) by mouth every 8 (eight) hours as needed for muscle spasms. 30 tablet 0  . Prenatal Vit-Fe Fumarate-FA (MULTIVITAMIN-PRENATAL) 27-0.8 MG TABS tablet Take 1 tablet by mouth daily at 12 noon.    . citalopram (CELEXA) 10 MG tablet Take 1 tablet (10 mg total) by mouth daily. For mood control 30 tablet 6  . Elastic Bandages & Supports (COMFORT FIT MATERNITY SUPP MED) MISC 1 Device by Does not apply route daily. 1 each 0    OB/GYN Status:  Patient's last menstrual period was 12/19/2016.  General Assessment Data Location of Assessment: [facility] TTS Assessment: In system Is this a Tele or Face-to-Face Assessment?: Tele Assessment Is this an Initial Assessment or a Re-assessment for this encounter?: Initial Assessment Marital status: Single Maiden name: NA Is patient pregnant?: Yes Pregnancy Status: Yes (Comment: include estimated delivery date) Living Arrangements: Parent, Spouse/significant other Can pt return to current  living arrangement?: Yes Admission Status: Voluntary Is patient capable of signing voluntary admission?: Yes Referral Source: Self/Family/Friend Insurance type: Medicaid     Crisis Care Plan Living Arrangements: Parent, Spouse/significant other Legal Guardian: Other:(self) Name of Psychiatrist: NA Name of Therapist: NA  Education Status Is patient currently in school?: No Is the  patient employed, unemployed or receiving disability?: Employed  Risk to self with the past 6 months Suicidal Ideation: No Has patient been a risk to self within the past 6 months prior to admission? : No Suicidal Intent: No Has patient had any suicidal intent within the past 6 months prior to admission? : No Is patient at risk for suicide?: No Suicidal Plan?: No Has patient had any suicidal plan within the past 6 months prior to admission? : No Access to Means: No What has been your use of drugs/alcohol within the last 12 months?: NA Previous Attempts/Gestures: No How many times?: 0 Other Self Harm Risks: NA Triggers for Past Attempts: None known Intentional Self Injurious Behavior: None Family Suicide History: No Recent stressful life event(s): Trauma (Comment) Persecutory voices/beliefs?: No Depression: Yes Depression Symptoms: Tearfulness, Isolating, Feeling worthless/self pity, Feeling angry/irritable Substance abuse history and/or treatment for substance abuse?: No Suicide prevention information given to non-admitted patients: Not applicable  Risk to Others within the past 6 months Homicidal Ideation: No Does patient have any lifetime risk of violence toward others beyond the six months prior to admission? : No Thoughts of Harm to Others: No Current Homicidal Intent: No Current Homicidal Plan: No Access to Homicidal Means: No Identified Victim: NA History of harm to others?: No Assessment of Violence: None Noted Violent Behavior Description: NA Does patient have access to weapons?: No Criminal Charges Pending?: No Does patient have a court date: No Is patient on probation?: No  Psychosis Hallucinations: None noted Delusions: None noted  Mental Status Report Appearance/Hygiene: Unremarkable Eye Contact: Fair Motor Activity: Freedom of movement Speech: Logical/coherent Level of Consciousness: Alert Mood: Depressed Affect: Depressed Anxiety Level:  Minimal Thought Processes: Coherent, Relevant Judgement: Unimpaired Orientation: Person, Place, Time, Situation Obsessive Compulsive Thoughts/Behaviors: None  Cognitive Functioning Concentration: Normal Memory: Recent Intact, Remote Intact Is patient IDD: No Is patient DD?: No Insight: Fair Impulse Control: Fair Appetite: Fair Have you had any weight changes? : No Change Sleep: No Change Total Hours of Sleep: 7 Vegetative Symptoms: None  ADLScreening [facility] Assessment Services) Patient's cognitive ability adequate to safely complete daily activities?: Yes Patient able to express need for assistance with ADLs?: Yes Independently performs ADLs?: Yes (appropriate for developmental age)  Prior Inpatient Therapy Prior Inpatient Therapy: No  Prior Outpatient Therapy Prior Outpatient Therapy: No  ADL Screening (condition at time of admission) Patient's cognitive ability adequate to safely complete daily activities?: Yes Is the patient deaf or have difficulty hearing?: No Does the patient have difficulty seeing, even when wearing glasses/contacts?: No Does the patient have difficulty concentrating, remembering, or making decisions?: No Patient able to express need for assistance with ADLs?: Yes Does the patient have difficulty dressing or bathing?: No Independently performs ADLs?: Yes (appropriate for developmental age) Does the patient have difficulty walking or climbing stairs?: No Weakness of Legs: Both Weakness of Arms/Hands: None  Home Assistive Devices/Equipment Home Assistive Devices/Equipment: None  Therapy Consults (therapy consults require a physician order) PT Evaluation Needed: No OT Evalulation Needed: No Abuse/Neglect Assessment (Assessment to be complete while patient is alone) Abuse/Neglect Assessment Can Be Completed: Yes Physical Abuse: Denies Verbal Abuse: Denies Sexual Abuse: Denies Exploitation of  patient/patient's resources: Denies Self-Neglect:  Denies Values / Beliefs Cultural Requests During Hospitalization: None Spiritual Requests During Hospitalization: None Consults Spiritual Care Consult Needed: No Social Work Consult Needed: No Merchant navy officerAdvance Directives (For Healthcare) Does Patient Have a Medical Advance Directive?: No Would patient like information on creating a medical advance directive?: No - Patient declined Nutrition Screen- [facility] Adult/WL/AP Patient's home diet: Regular  Additional Information 1:1 In Past 12 Months?: No CIRT Risk: No Elopement Risk: No Does patient have medical clearance?: No     Disposition:  Disposition Initial Assessment Completed for this Encounter: Yes Disposition of Patient: Discharge Patient refused recommended treatment: No  This service was provided via telemedicine using a 2-way, interactive audio and video technology.  Names of all persons participating in this telemedicine service and their role in this encounter. Name: Assunta FoundShuvon Rankin Role: NP  Name:  Role:   Name:  Role:   Name:  Role:     Emmit PomfretLevette,Jassmin Kemmerer D 10/30/2017 6:07 PM

## 2017-10-31 ENCOUNTER — Ambulatory Visit (INDEPENDENT_AMBULATORY_CARE_PROVIDER_SITE_OTHER): Payer: Medicaid Other | Admitting: Clinical

## 2017-10-31 ENCOUNTER — Encounter: Payer: Self-pay | Admitting: Family Medicine

## 2017-10-31 ENCOUNTER — Institutional Professional Consult (permissible substitution): Payer: Self-pay

## 2017-10-31 DIAGNOSIS — F419 Anxiety disorder, unspecified: Secondary | ICD-10-CM | POA: Diagnosis not present
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
## 2017-11-08 ENCOUNTER — Telehealth: Payer: Self-pay | Admitting: Clinical

## 2017-11-08 ENCOUNTER — Encounter (HOSPITAL_COMMUNITY): Payer: Self-pay

## 2017-11-08 NOTE — Telephone Encounter (Signed)
Left HIPPA-compliant message to call back Jamie from [facility]  at [phone].  

## 2017-11-15 ENCOUNTER — Other Ambulatory Visit: Payer: Self-pay | Admitting: Certified Nurse Midwife

## 2017-11-15 ENCOUNTER — Ambulatory Visit (HOSPITAL_COMMUNITY)
Admission: RE | Admit: 2017-11-15 | Discharge: 2017-11-15 | Disposition: A | Discharge status: Home / Self Care | Payer: Medicaid Other | Source: Ambulatory Visit | Attending: Certified Nurse Midwife | Admitting: Certified Nurse Midwife

## 2017-11-15 DIAGNOSIS — Z3A25 25 weeks gestation of pregnancy: Secondary | ICD-10-CM | POA: Diagnosis not present

## 2017-11-15 DIAGNOSIS — O34219 Maternal care for unspecified type scar from previous cesarean delivery: Secondary | ICD-10-CM

## 2017-11-15 DIAGNOSIS — Z363 Encounter for antenatal screening for malformations: Secondary | ICD-10-CM | POA: Diagnosis not present

## 2017-11-15 DIAGNOSIS — Z3480 Encounter for supervision of other normal pregnancy, unspecified trimester: Secondary | ICD-10-CM

## 2017-11-21 ENCOUNTER — Encounter (HOSPITAL_COMMUNITY): Payer: Self-pay | Admitting: *Deleted

## 2017-11-21 ENCOUNTER — Inpatient Hospital Stay (HOSPITAL_COMMUNITY)
Admission: AD | Admit: 2017-11-21 | Discharge: 2017-11-21 | Disposition: A | Discharge status: Home / Self Care | Payer: Medicaid Other | Source: Ambulatory Visit | Attending: Obstetrics and Gynecology | Admitting: Obstetrics and Gynecology

## 2017-11-21 ENCOUNTER — Other Ambulatory Visit: Payer: Self-pay

## 2017-11-21 DIAGNOSIS — O26892 Other specified pregnancy related conditions, second trimester: Secondary | ICD-10-CM | POA: Diagnosis not present

## 2017-11-21 DIAGNOSIS — Z87891 Personal history of nicotine dependence: Secondary | ICD-10-CM | POA: Diagnosis not present

## 2017-11-21 DIAGNOSIS — Z833 Family history of diabetes mellitus: Secondary | ICD-10-CM | POA: Diagnosis not present

## 2017-11-21 DIAGNOSIS — Z9049 Acquired absence of other specified parts of digestive tract: Secondary | ICD-10-CM | POA: Insufficient documentation

## 2017-11-21 DIAGNOSIS — Z885 Allergy status to narcotic agent status: Secondary | ICD-10-CM | POA: Diagnosis not present

## 2017-11-21 DIAGNOSIS — R109 Unspecified abdominal pain: Secondary | ICD-10-CM

## 2017-11-21 DIAGNOSIS — Z7982 Long term (current) use of aspirin: Secondary | ICD-10-CM | POA: Insufficient documentation

## 2017-11-21 DIAGNOSIS — F4310 Post-traumatic stress disorder, unspecified: Secondary | ICD-10-CM | POA: Insufficient documentation

## 2017-11-21 DIAGNOSIS — O34219 Maternal care for unspecified type scar from previous cesarean delivery: Secondary | ICD-10-CM | POA: Diagnosis not present

## 2017-11-21 DIAGNOSIS — Z888 Allergy status to other drugs, medicaments and biological substances status: Secondary | ICD-10-CM | POA: Insufficient documentation

## 2017-11-21 DIAGNOSIS — M549 Dorsalgia, unspecified: Secondary | ICD-10-CM | POA: Insufficient documentation

## 2017-11-21 DIAGNOSIS — F329 Major depressive disorder, single episode, unspecified: Secondary | ICD-10-CM | POA: Diagnosis not present

## 2017-11-21 DIAGNOSIS — Z9889 Other specified postprocedural states: Secondary | ICD-10-CM | POA: Diagnosis not present

## 2017-11-21 DIAGNOSIS — O209 Hemorrhage in early pregnancy, unspecified: Secondary | ICD-10-CM | POA: Diagnosis not present

## 2017-11-21 DIAGNOSIS — Z809 Family history of malignant neoplasm, unspecified: Secondary | ICD-10-CM | POA: Diagnosis not present

## 2017-11-21 DIAGNOSIS — Z79899 Other long term (current) drug therapy: Secondary | ICD-10-CM | POA: Diagnosis not present

## 2017-11-21 DIAGNOSIS — O99342 Other mental disorders complicating pregnancy, second trimester: Secondary | ICD-10-CM | POA: Diagnosis not present

## 2017-11-21 DIAGNOSIS — Z3A19 19 weeks gestation of pregnancy: Secondary | ICD-10-CM | POA: Diagnosis not present

## 2017-11-21 LAB — URINALYSIS, ROUTINE W REFLEX MICROSCOPIC
Bilirubin Urine: NEGATIVE
Glucose, UA: NEGATIVE mg/dL
HGB URINE DIPSTICK: NEGATIVE
Ketones, ur: NEGATIVE mg/dL
Leukocytes, UA: NEGATIVE
NITRITE: NEGATIVE
PH: 7 (ref 5.0–8.0)
Protein, ur: NEGATIVE mg/dL
SPECIFIC GRAVITY, URINE: 1.016 (ref 1.005–1.030)

## 2017-11-21 NOTE — Discharge Instructions (Signed)
Your abdominal pain is likely caused by some muscle strain. Take your flexeril and tylenol at home. Drink at least 8 8-oz glasses of water every day. Return here if you have worsening pain and vaginal bleeding. Keep your scheduled appointments in the office.  Abdominal Pain During Pregnancy Abdominal pain is common in pregnancy. Most of the time, it does not cause harm. There are many causes of abdominal pain. Some causes are more serious than others and sometimes the cause is not known. Abdominal pain can be a sign that something is very wrong with the pregnancy or the pain may have nothing to do with the pregnancy. Always tell your health care provider if you have any abdominal pain. Follow these instructions at home:  Do not have sex or put anything in your vagina until your symptoms go away completely.  Watch your abdominal pain for any changes.  Get plenty of rest until your pain improves.  Drink enough fluid to keep your urine clear or pale yellow.  Take over-the-counter or prescription medicines only as told by your health care provider.  Keep all follow-up visits as told by your health care provider. This is important. Contact a health care provider if:  You have a fever.  Your pain gets worse or you have cramping.  Your pain continues after resting. Get help right away if:  You are bleeding, leaking fluid, or passing tissue from the vagina.  You have vomiting or diarrhea that does not go away.  You have painful or bloody urination.  You notice a decrease in your baby's movements.  You feel very weak or faint.  You have shortness of breath.  You develop a severe headache with abdominal pain.  You have abnormal vaginal discharge with abdominal pain. This information is not intended to replace advice given to you by your health care provider. Make sure you discuss any questions you have with your health care provider. Document Released: 03/05/2005 Document Revised:  12/15/2015 Document Reviewed: 10/02/2012 Elsevier Interactive Patient Education  [employer].

## 2017-11-21 NOTE — MAU Note (Signed)
Pt presents to MAU with complaints of vaginal spotting this morning with sharp pains in her abdomen. Pt states she picked up a large dog at work and started having pain afterwards.

## 2017-11-21 NOTE — MAU Provider Note (Signed)
History     CSN: [id number]  Arrival date and time: 11/21/17 1121   First Provider Initiated Contact with Patient 11/21/17 1213      Chief Complaint  Patient presents with  . Vaginal Bleeding  . Abdominal Pain   HPI  Laura [name] 22 y.o. [redacted]w[redacted]d Assisted in lifting a 100 pound dog at work and then had sharp abdominal pains and noticed some pink blood on the toilet tissue after voiding.  Came to make sure the baby is OK.  Had anatomy scan yesterday.  Feels like pain is similar to round ligament pain on left side.  Pain is more continuous on low right side.  No bleeding here in MAU.  Heard FHT via doppler and was reassured.  Is feeling the baby move well.  Is nervous as she has had 3 miscarriages although these were all around 7-[redacted] weeks gestation.  Has back pain also but she is not worried about the back pain as she has been noticing it during the pregnancy.  OB History    Gravida  6   Para  2   Term  2   Preterm      AB  2   Living  2     SAB  2   TAB      Ectopic      Multiple  0   Live Births  2           Past Medical History:  Diagnosis Date  . Anxiety   . Depression    since 12/18 from MAB  . PTSD (post-traumatic stress disorder) 2019    Past Surgical History:  Procedure Laterality Date  . APPENDECTOMY    . CESAREAN SECTION N/A 12/30/2015   Procedure: CESAREAN SECTION;  Surgeon: Adam Phenix, MD;  Location: [facility];  Service: Obstetrics;  Laterality: N/A;  . CHOLECYSTECTOMY      Family History  Problem Relation Age of Onset  . Cancer Maternal Aunt   . Cancer Maternal Uncle   . Diabetes Paternal Aunt   . Diabetes Paternal Uncle   . Diabetes Maternal Grandmother   . Cancer Maternal Grandfather   . Cancer Paternal Grandmother   . Diabetes Paternal Grandmother   . Diabetes Paternal Grandfather     Social History   Tobacco Use  . Smoking status: Former Games developer  . Smokeless tobacco: Former Engineer, water Use Topics  .  Alcohol use: No  . Drug use: No    Allergies:  Allergies  Allergen Reactions  . Divalproex Sodium Other (See Comments)    Reaction:  Memory loss   . Demerol [Meperidine] Hives    Medications Prior to Admission  Medication Sig Dispense Refill Last Dose  . aspirin 81 MG chewable tablet Chew 1 tablet (81 mg total) by mouth daily. 60 tablet 3 11/20/2017 at Unknown time  . citalopram (CELEXA) 20 MG tablet Take 1 tablet (20 mg total) by mouth daily. 30 tablet 3 11/20/2017 at Unknown time  . cyclobenzaprine (FLEXERIL) 10 MG tablet Take 1 tablet (10 mg total) by mouth every 8 (eight) hours as needed for muscle spasms. 30 tablet 0 Past Week at Unknown time  . Elastic Bandages & Supports (COMFORT FIT MATERNITY SUPP MED) MISC 1 Device by Does not apply route daily. 1 each 0 11/20/2017 at Unknown time  . Prenatal Vit-Fe Fumarate-FA (MULTIVITAMIN-PRENATAL) 27-0.8 MG TABS tablet Take 1 tablet by mouth daily at 12 noon.   11/20/2017 at Unknown time  .  citalopram (CELEXA) 10 MG tablet Take 1 tablet (10 mg total) by mouth daily. For mood control 30 tablet 6 Taking    Review of Systems  Constitutional: Negative for fever.  Gastrointestinal: Positive for abdominal pain. Negative for diarrhea, nausea and vomiting.  Genitourinary: Negative for vaginal discharge.       Small amount of bleeding noted on tissue when wiping - pink in color  Musculoskeletal: Positive for back pain.   Physical Exam   Blood pressure 114/75, pulse 74, temperature 98.1 F (36.7 C), resp. rate 16, height 5\' 7"  (1.702 m), weight 84.8 kg, last menstrual period 12/19/2016, unknown if currently breastfeeding.  Physical Exam  Nursing note and vitals reviewed. Constitutional: She is oriented to person, place, and time. She appears well-developed and well-nourished.  HENT:  Head: Normocephalic.  Eyes: EOM are normal.  Neck: Neck supple.  GI: Soft. There is tenderness. There is no rebound and no guarding.  FHT 154 with doppler - feeling  the baby move well.  Genitourinary:  Genitourinary Comments: Speculum exam: Vulva - no blood Vagina - Small amount of white discharge, no blood at all in vagina, no odor Cervix - No contact bleeding Bimanual exam: Cervix closed and thick Chaperone present for exam.   Musculoskeletal: Normal range of motion.  Neurological: She is alert and oriented to person, place, and time.  Skin: Skin is warm and dry.  Psychiatric: She has a normal mood and affect.    MAU Course  Procedures Results for orders placed or performed during the hospital encounter of 11/21/17 (from the past 24 hour(s))  Urinalysis, Routine w reflex microscopic     Status: None   Collection Time: 11/21/17 11:34 AM  Result Value Ref Range   Color, Urine YELLOW YELLOW   APPearance CLEAR CLEAR   Specific Gravity, Urine 1.016 1.005 - 1.030   pH 7.0 5.0 - 8.0   Glucose, UA NEGATIVE NEGATIVE mg/dL   Hgb urine dipstick NEGATIVE NEGATIVE   Bilirubin Urine NEGATIVE NEGATIVE   Ketones, ur NEGATIVE NEGATIVE mg/dL   Protein, ur NEGATIVE NEGATIVE mg/dL   Nitrite NEGATIVE NEGATIVE   Leukocytes, UA NEGATIVE NEGATIVE    MDM Discussed that she may have muscle strain due to hormones in pregnancy making her joints more flexible and more susceptible to injury with heavy lifting.  Client is agreeable to going home to use her flexeril today.  Precautions given to return asap if no fetal movement or if bleeding is worsening.  Client has no vaginal bleeding on exam and had anatomy ultrasound yesterday.  Client plans to change job locations next week and will not need to lift at work.  Advised no lifting before changing locations.  Client is agreeable to the plan and left with no further questions.  Assessment and Plan  Abdominal pain in pregnancy due to Muscle strain due to lifting at work   Plan The abdominal pain is likely caused by some muscle strain.  FHT heard by doppler. Take your flexeril and tylenol at home. Drink at least 8  8-oz glasses of water every day. Return here if you have worsening pain and vaginal bleeding. Keep your scheduled appointments in the office.  Anika Shore L Mylinh Cragg 11/21/2017, 12:19 PM

## 2017-11-25 ENCOUNTER — Ambulatory Visit (INDEPENDENT_AMBULATORY_CARE_PROVIDER_SITE_OTHER): Payer: Medicaid Other | Admitting: Certified Nurse Midwife

## 2017-11-25 VITALS — BP 101/66 | HR 77 | Wt 186.0 lb

## 2017-11-25 DIAGNOSIS — Z23 Encounter for immunization: Secondary | ICD-10-CM | POA: Diagnosis not present

## 2017-11-25 DIAGNOSIS — O9989 Other specified diseases and conditions complicating pregnancy, childbirth and the puerperium: Secondary | ICD-10-CM

## 2017-11-25 DIAGNOSIS — Z8659 Personal history of other mental and behavioral disorders: Secondary | ICD-10-CM

## 2017-11-25 DIAGNOSIS — Z3480 Encounter for supervision of other normal pregnancy, unspecified trimester: Secondary | ICD-10-CM

## 2017-11-25 DIAGNOSIS — O34219 Maternal care for unspecified type scar from previous cesarean delivery: Secondary | ICD-10-CM

## 2017-11-25 NOTE — Progress Notes (Signed)
Subjective:  Laura [name] is a 22 y.o. [id number] at [redacted]w[redacted]d being seen today for ongoing prenatal care.  She is currently monitored for the following issues for this low-risk pregnancy and has S/P cesarean section; Supervision of other normal pregnancy, antepartum; MDD (major depressive disorder), single episode, severe , no psychosis (HCC); Indigestion; Recurrent pregnancy loss; History of postpartum depression, currently pregnant; and History of cesarean delivery, antepartum on their problem list.  Patient reports no complaints.  Contractions: Not present. Vag. Bleeding: Scant.  Movement: Absent. Denies leaking of fluid.   The following portions of the patient's history were reviewed and updated as appropriate: allergies, current medications, past family history, past medical history, past social history, past surgical history and problem list. Problem list updated.  Objective:   Vitals:   11/25/17 1014  BP: 101/66  Pulse: 77  Weight: 84.4 kg    Fetal Status: Fetal Heart Rate (bpm): 127 Fundal Height: 20 cm Movement: Absent     General:  Alert, oriented and cooperative. Patient is in no acute distress.  Skin: Skin is warm and dry.  Cardiovascular: Normal heart rate noted  Respiratory: Normal respiratory effort and rate  Abdomen: Soft, appropriate for gestational age. Pain/Pressure: Absent     Pelvic: Vag. Bleeding: Scant Vag D/C Character: Thick   Cervical exam deferred        Extremities: Normal range of motion.  Edema: None  Mental Status: Normal mood and affect. Normal behavior. Normal judgment and thought content.   Urinalysis:      Assessment and Plan:  Pregnancy: [id number] at [redacted]w[redacted]d  1. Supervision of other normal pregnancy, antepartum  2. History of cesarean delivery, antepartum - considering TOLAC, discussed consent, will think about it  3. History of postpartum depression, currently pregnant - stable, doing well on Celexa, looking for new therapist  4. Immunization  due - Flu Vaccine QUAD 36+ mos IM (Fluarix, Quad PF)  Preterm labor symptoms and general obstetric precautions including but not limited to vaginal bleeding, contractions, leaking of fluid and fetal movement were reviewed in detail with the patient. Please refer to After Visit Summary for other counseling recommendations.  Return in about 4 weeks (around 12/23/2017).   Donette Larry, CNM

## 2017-12-23 ENCOUNTER — Ambulatory Visit (INDEPENDENT_AMBULATORY_CARE_PROVIDER_SITE_OTHER): Payer: Medicaid Other | Admitting: Obstetrics & Gynecology

## 2017-12-23 VITALS — BP 108/67 | HR 75 | Wt 191.0 lb

## 2017-12-23 DIAGNOSIS — O34219 Maternal care for unspecified type scar from previous cesarean delivery: Secondary | ICD-10-CM

## 2017-12-23 DIAGNOSIS — Z3480 Encounter for supervision of other normal pregnancy, unspecified trimester: Secondary | ICD-10-CM

## 2017-12-23 NOTE — Progress Notes (Signed)
   PRENATAL VISIT NOTE  Subjective:  Laura [name] is a 22 y.o. [phone] at [redacted]w[redacted]d being seen today for ongoing prenatal care.  She is currently monitored for the following issues for this low-risk pregnancy and has S/P cesarean section; Supervision of other normal pregnancy, antepartum; MDD (major depressive disorder), single episode, severe , no psychosis (HCC); Indigestion; Recurrent pregnancy loss; History of postpartum depression, currently pregnant; and History of cesarean delivery, antepartum on their problem list.  Patient reports no complaints.  Contractions: Not present. Vag. Bleeding: None.  Movement: Present. Denies leaking of fluid.   The following portions of the patient's history were reviewed and updated as appropriate: allergies, current medications, past family history, past medical history, past social history, past surgical history and problem list. Problem list updated.  Objective:   Vitals:   12/23/17 1110  BP: 108/67  Pulse: 75  Weight: 191 lb (86.6 kg)    Fetal Status: Fetal Heart Rate (bpm): 128   Movement: Present     General:  Alert, oriented and cooperative. Patient is in no acute distress.  Skin: Skin is warm and dry. No rash noted.   Cardiovascular: Normal heart rate noted  Respiratory: Normal respiratory effort, no problems with respiration noted  Abdomen: Soft, gravid, appropriate for gestational age.  Pain/Pressure: Absent     Pelvic: Cervical exam deferred        Extremities: Normal range of motion.  Edema: None  Mental Status: Normal mood and affect. Normal behavior. Normal judgment and thought content.   Assessment and Plan:  Pregnancy: [id number] at [redacted]w[redacted]d  1. History of cesarean delivery, antepartum   2. Supervision of other normal pregnancy, antepartum   Preterm labor symptoms and general obstetric precautions including but not limited to vaginal bleeding, contractions, leaking of fluid and fetal movement were reviewed in detail with the  patient. Please refer to After Visit Summary for other counseling recommendations.  Return in about 4 weeks (around 01/20/2018) for 2 hour GTT at next visit.  No future appointments.  Allie Bossier, MD

## 2017-12-28 ENCOUNTER — Encounter (HOSPITAL_COMMUNITY): Payer: Self-pay | Admitting: *Deleted

## 2017-12-28 ENCOUNTER — Inpatient Hospital Stay (HOSPITAL_COMMUNITY)
Admission: AD | Admit: 2017-12-28 | Discharge: 2017-12-28 | Disposition: A | Discharge status: Home / Self Care | Payer: Medicaid Other | Source: Ambulatory Visit | Attending: Family Medicine | Admitting: Family Medicine

## 2017-12-28 DIAGNOSIS — Z885 Allergy status to narcotic agent status: Secondary | ICD-10-CM | POA: Diagnosis not present

## 2017-12-28 DIAGNOSIS — Z7982 Long term (current) use of aspirin: Secondary | ICD-10-CM | POA: Diagnosis not present

## 2017-12-28 DIAGNOSIS — Z3A25 25 weeks gestation of pregnancy: Secondary | ICD-10-CM | POA: Insufficient documentation

## 2017-12-28 DIAGNOSIS — O4702 False labor before 37 completed weeks of gestation, second trimester: Secondary | ICD-10-CM | POA: Diagnosis not present

## 2017-12-28 DIAGNOSIS — R197 Diarrhea, unspecified: Secondary | ICD-10-CM | POA: Diagnosis not present

## 2017-12-28 DIAGNOSIS — O9989 Other specified diseases and conditions complicating pregnancy, childbirth and the puerperium: Secondary | ICD-10-CM | POA: Insufficient documentation

## 2017-12-28 DIAGNOSIS — Z87891 Personal history of nicotine dependence: Secondary | ICD-10-CM | POA: Diagnosis not present

## 2017-12-28 LAB — URINALYSIS, ROUTINE W REFLEX MICROSCOPIC
Bilirubin Urine: NEGATIVE
Glucose, UA: NEGATIVE mg/dL
HGB URINE DIPSTICK: NEGATIVE
Ketones, ur: NEGATIVE mg/dL
LEUKOCYTES UA: NEGATIVE
NITRITE: NEGATIVE
PROTEIN: NEGATIVE mg/dL
Specific Gravity, Urine: 1.01 (ref 1.005–1.030)
pH: 7 (ref 5.0–8.0)

## 2017-12-28 LAB — FETAL FIBRONECTIN: FETAL FIBRONECTIN: NEGATIVE

## 2017-12-28 MED ORDER — LOPERAMIDE HCL 2 MG PO CAPS
4.0000 mg | ORAL_CAPSULE | Freq: Once | ORAL | Status: AC
  Administered 2017-12-28: 4 mg via ORAL
  Filled 2017-12-28: qty 2

## 2017-12-28 NOTE — MAU Provider Note (Signed)
Chief Complaint:  Contractions and Diarrhea   First Provider Initiated Contact with Patient 12/28/17 1506      HPI: Laura [name] is a 22 y.o. [id number] at [redacted]w[redacted]d who presents to maternity admissions reporting Contractions x 1 week and diarrhea x 24 hours x 10.   Modifying factors: No improvement with p.o. hydration.  Has not taken anything for the diarrhea. Associated signs and symptoms: Positive for diarrhea, mild, occasional nausea and seeing a little bit of right red blood in stools yesterday.  Attributes it to her hemorrhoids.  Negative for fever, chills, vomiting, vaginal bleeding, vaginal discharge, leaking of fluid. Good fetal movement.   Works with Physicist, medical.  Has some concern that she could have contracted something from them.  Pregnancy Course: Uncomplicated.  History of C-section x1 in 2017  Past Medical History:  Diagnosis Date  . Anxiety   . Depression    since 12/18 from MAB  . PTSD (post-traumatic stress disorder) 2019   OB History  Gravida Para Term Preterm AB Living  6 2 2   3 2   SAB TAB Ectopic Multiple Live Births  3     0 2    # Outcome Date GA Lbr Len/2nd Weight Sex Delivery Anes PTL Lv  6 Current           5 SAB 2019          4 SAB 2018          3 Term 12/30/15 [redacted]w[redacted]d  4205 g M CS-LTranv Spinal  LIV  2 SAB 2016          1 Term 06/12/13     Vag-Spont   LIV   Past Surgical History:  Procedure Laterality Date  . APPENDECTOMY    . CESAREAN SECTION N/A 12/30/2015   Procedure: CESAREAN SECTION;  Surgeon: Adam Phenix, MD;  Location: [facility] BIRTHING SUIT[facility];  Service: Obstetrics;  Laterality: N/A;  . CHOLECYSTECTOMY     Family History  Problem Relation Age of Onset  . Cancer Maternal Aunt   . Cancer Maternal Uncle   . Diabetes Paternal Aunt   . Diabetes Paternal Uncle   . Diabetes Maternal Grandmother   . Cancer Maternal Grandfather   . Cancer Paternal Grandmother   . Diabetes Paternal Grandmother   . Diabetes Paternal Grandfather    Social History    Tobacco Use  . Smoking status: Former Games developer  . Smokeless tobacco: Former Engineer, water Use Topics  . Alcohol use: No  . Drug use: No   Allergies  Allergen Reactions  . Divalproex Sodium Other (See Comments)    Reaction:  Memory loss   . Demerol [Meperidine] Hives   Medications Prior to Admission  Medication Sig Dispense Refill Last Dose  . aspirin 81 MG chewable tablet Chew 1 tablet (81 mg total) by mouth daily. 60 tablet 3 Taking  . citalopram (CELEXA) 20 MG tablet Take 1 tablet (20 mg total) by mouth daily. 30 tablet 3 Taking  . cyclobenzaprine (FLEXERIL) 10 MG tablet Take 1 tablet (10 mg total) by mouth every 8 (eight) hours as needed for muscle spasms. 30 tablet 0 Taking  . Elastic Bandages & Supports (COMFORT FIT MATERNITY SUPP MED) MISC 1 Device by Does not apply route daily. 1 each 0 Taking  . Prenatal Vit-Fe Fumarate-FA (MULTIVITAMIN-PRENATAL) 27-0.8 MG TABS tablet Take 1 tablet by mouth daily at 12 noon.   Taking    I have reviewed patient's Past Medical Hx, Surgical Hx, Family Hx,  Social Hx, medications and allergies.   ROS:  Review of Systems  Constitutional: Negative for chills and fever.  Gastrointestinal: Positive for abdominal pain (Contractions only), anal bleeding, diarrhea and nausea. Negative for blood in stool and vomiting.  Genitourinary: Negative for dysuria, flank pain, frequency, hematuria, vaginal bleeding and vaginal discharge.    Physical Exam   Patient Vitals for the past 24 hrs:  BP Temp Temp src Pulse Resp SpO2  12/28/17 1616 105/66 - - 63 - -  12/28/17 1400 105/66 98.1 F (36.7 C) Oral 67 18 100 %   Constitutional: Well-developed, well-nourished female in no acute distress.  Cardiovascular: normal rate Respiratory: normal effort GI: Abd soft, non-tender, gravid appropriate for gestational age.  MS: Extremities nontender, no edema, normal ROM Neurologic: Alert and oriented x 4.  GU:  Pelvic: NEFG, physiologic discharge, no blood,  cervix clean. No CMT  Dilation: Closed Effacement (%): Thick Cervical Position: Middle Exam by:: Dorathy Kinsman, CNM  FHT:  Baseline 120 , moderate variability, accelerations present, no decelerations Contractions: Irregular, mild   Labs: Results for orders placed or performed during the hospital encounter of 12/28/17 (from the past 24 hour(s))  Urinalysis, Routine w reflex microscopic     Status: None   Collection Time: 12/28/17  2:21 PM  Result Value Ref Range   Color, Urine YELLOW YELLOW   APPearance CLEAR CLEAR   Specific Gravity, Urine 1.010 1.005 - 1.030   pH 7.0 5.0 - 8.0   Glucose, UA NEGATIVE NEGATIVE mg/dL   Hgb urine dipstick NEGATIVE NEGATIVE   Bilirubin Urine NEGATIVE NEGATIVE   Ketones, ur NEGATIVE NEGATIVE mg/dL   Protein, ur NEGATIVE NEGATIVE mg/dL   Nitrite NEGATIVE NEGATIVE   Leukocytes, UA NEGATIVE NEGATIVE  Fetal fibronectin     Status: None   Collection Time: 12/28/17  3:16 PM  Result Value Ref Range   Fetal Fibronectin NEGATIVE NEGATIVE   Imaging:  No results found.  MAU Course: Orders Placed This Encounter  Procedures  . Urinalysis, Routine w reflex microscopic  . Fetal fibronectin  . Encourage fluids  . Discharge patient   Meds ordered this encounter  Medications  . loperamide (IMODIUM) capsule 4 mg    MDM: -Preterm contractions without evidence of active preterm labor.  Cervix closed and fetal fibronectin negative.  Contractions likely worsened due to diarrhea.  Will treat diarrhea with Imodium to help avoid stimulation of uterine contractions.  -Acute diarrhea, presumed infectious.  Scant bright red bleeding yesterday likely due to hemorrhoids being irritated by frequent bowel movements.  Explained to patient that most diarrheal illnesses are viral and will run their course.  If no resolution after 4 days patient may need stool cultures.  No risk factors for C. difficile.  Assessment: 1. Preterm uterine contractions in second trimester,  antepartum   2. Acute diarrhea     Plan: Discharge home in stable condition.  Preterm labor precautions and fetal kick counts Push fluids Discussed importance of hand hygiene Follow-up Information    [facility] Follow up.   Specialty:  Obstetrics and Gynecology Why:  As scheduled or sooner as needed if symptoms worsen Contact information: [address] [place] [address], [address] [place] [address] [phone]       [facility] Follow up.   Why:  As needed in pregnancy emergencies Contact information: [address] [id number] mc [place] [address] [phone]        Push fluids. Recommend soap and water  handwashing at home. Allergies as of 12/28/2017      Reactions   Divalproex Sodium Other (See Comments)   Reaction:  Memory loss    Demerol [meperidine] Hives      Medication List    TAKE these medications   aspirin 81 MG chewable tablet Chew 1 tablet (81 mg total) by mouth daily.   citalopram 20 MG tablet Commonly known as:  CELEXA Take 1 tablet (20 mg total) by mouth daily.   COMFORT FIT MATERNITY SUPP MED Misc 1 Device by Does not apply route daily.   cyclobenzaprine 10 MG tablet Commonly known as:  FLEXERIL Take 1 tablet (10 mg total) by mouth every 8 (eight) hours as needed for muscle spasms.   multivitamin-prenatal 27-0.8 MG Tabs tablet Take 1 tablet by mouth daily at 12 noon.       Katrinka Blazing, [place], CNM 12/28/2017 4:32 PM

## 2017-12-28 NOTE — Discharge Instructions (Signed)
If your diarrhea does not resolve after 4 days you may need stool cultures.  Call the office if you have any questions.  Preterm Labor and Birth Information The normal length of a pregnancy is 39-41 weeks. Preterm labor is when labor starts before 37 completed weeks of pregnancy. What are the risk factors for preterm labor? Preterm labor is more likely to occur in women who:  Have certain infections during pregnancy such as a bladder infection, sexually transmitted infection, or infection inside the uterus (chorioamnionitis).  Have a shorter-than-normal cervix.  Have gone into preterm labor before.  Have had surgery on their cervix.  Are younger than age 45 or older than age 69.  Are African American.  Are pregnant with twins or multiple babies (multiple gestation).  Take street drugs or smoke while pregnant.  Do not gain enough weight while pregnant.  Became pregnant shortly after having been pregnant.  What are the symptoms of preterm labor? Symptoms of preterm labor include:  Cramps similar to those that can happen during a menstrual period. The cramps may happen with diarrhea.  Pain in the abdomen or lower back.  Regular uterine contractions that may feel like tightening of the abdomen.  A feeling of increased pressure in the pelvis.  Increased watery or bloody mucus discharge from the vagina.  Water breaking (ruptured amniotic sac).  Why is it important to recognize signs of preterm labor? It is important to recognize signs of preterm labor because babies who are born prematurely may not be fully developed. This can put them at an increased risk for:  Long-term (chronic) heart and lung problems.  Difficulty immediately after birth with regulating body systems, including blood sugar, body temperature, heart rate, and breathing rate.  Bleeding in the brain.  Cerebral palsy.  Learning difficulties.  Death.  These risks are highest for babies who are born  before 34 weeks of pregnancy. How is preterm labor treated? Treatment depends on the length of your pregnancy, your condition, and the health of your baby. It may involve:  Having a stitch (suture) placed in your cervix to prevent your cervix from opening too early (cerclage).  Taking or being given medicines, such as: ? Hormone medicines. These may be given early in pregnancy to help support the pregnancy. ? Medicine to stop contractions. ? Medicines to help mature the babys lungs. These may be prescribed if the risk of delivery is high. ? Medicines to prevent your baby from developing cerebral palsy.  If the labor happens before 34 weeks of pregnancy, you may need to stay in the hospital. What should I do if I think I am in preterm labor? If you think that you are going into preterm labor, call your health care provider right away. How can I prevent preterm labor in future pregnancies? To increase your chance of having a full-term pregnancy:  Do not use any tobacco products, such as cigarettes, chewing tobacco, and e-cigarettes. If you need help quitting, ask your health care provider.  Do not use street drugs or medicines that have not been prescribed to you during your pregnancy.  Talk with your health care provider before taking any herbal supplements, even if you have been taking them regularly.  Make sure you gain a healthy amount of weight during your pregnancy.  Watch for infection. If you think that you might have an infection, get it checked right away.  Make sure to tell your health care provider if you have gone into preterm labor  before.  This information is not intended to replace advice given to you by your health care provider. Make sure you discuss any questions you have with your health care provider. Document Released: 05/26/2003 Document Revised: 08/16/2015 Document Reviewed: 07/27/2015 Elsevier Interactive Patient Education  2018 [employer].   Diarrhea,  Adult Diarrhea is frequent loose and watery bowel movements. Diarrhea can make you feel weak and cause you to become dehydrated. Dehydration can make you tired and thirsty, cause you to have a dry mouth, and decrease how often you urinate. Diarrhea typically lasts 2-3 days. However, it can last longer if it is a sign of something more serious. It is important to treat your diarrhea as told by your health care provider. Follow these instructions at home: Eating and drinking  Follow these recommendations as told by your health care provider:  Take an oral rehydration solution (ORS). This is a drink that is sold at pharmacies and retail stores.  Drink clear fluids, such as water, ice chips, diluted fruit juice, and low-calorie sports drinks.  Eat bland, easy-to-digest foods in small amounts as you are able. These foods include bananas, applesauce, rice, lean meats, toast, and crackers.  Avoid drinking fluids that contain a lot of sugar or caffeine, such as energy drinks, sports drinks, and soda.  Avoid alcohol.  Avoid spicy or fatty foods.  General instructions  Drink enough fluid to keep your urine clear or pale yellow.  Wash your hands often. If soap and water are not available, use hand sanitizer.  Make sure that all people in your household wash their hands well and often.  Take over-the-counter and prescription medicines only as told by your health care provider.  Rest at home while you recover.  Watch your condition for any changes.  Take a warm bath to relieve any burning or pain from frequent diarrhea episodes.  Keep all follow-up visits as told by your health care provider. This is important. Contact a health care provider if:  You have a fever.  Your diarrhea gets worse.  You have new symptoms.  You cannot keep fluids down.  You feel light-headed or dizzy.  You have a headache  You have muscle cramps. Get help right away if:  You have chest pain.  You  feel extremely weak or you faint.  You have bloody or black stools or stools that look like tar.  You have severe pain, cramping, or bloating in your abdomen.  You have trouble breathing or you are breathing very quickly.  Your heart is beating very quickly.  Your skin feels cold and clammy.  You feel confused.  You have signs of dehydration, such as: ? Dark urine, very little urine, or no urine. ? Cracked lips. ? Dry mouth. ? Sunken eyes. ? Sleepiness. ? Weakness. This information is not intended to replace advice given to you by your health care provider. Make sure you discuss any questions you have with your health care provider. Document Released: 02/23/2002 Document Revised: 07/14/2015 Document Reviewed: 11/09/2014 Elsevier Interactive Patient Education  [employer].

## 2017-12-28 NOTE — MAU Note (Signed)
Pt states she has been having braxton hicks contractions the last couple of weeks but the last couple of days had 5 painful ones a day.  It woke her up last night with back pain and som period cramping.  Started having diarrhea yesterday morning, about 10 times in the last 24 hours.  Some nausea but no vomiting. Denies vb/lof.

## 2017-12-31 ENCOUNTER — Inpatient Hospital Stay (HOSPITAL_COMMUNITY)
Admission: AD | Admit: 2017-12-31 | Discharge: 2017-12-31 | Disposition: A | Discharge status: Home / Self Care | Payer: Medicaid Other | Source: Ambulatory Visit | Attending: Obstetrics and Gynecology | Admitting: Obstetrics and Gynecology

## 2017-12-31 ENCOUNTER — Encounter (HOSPITAL_COMMUNITY): Payer: Self-pay

## 2017-12-31 DIAGNOSIS — Z7982 Long term (current) use of aspirin: Secondary | ICD-10-CM | POA: Insufficient documentation

## 2017-12-31 DIAGNOSIS — R197 Diarrhea, unspecified: Secondary | ICD-10-CM | POA: Diagnosis not present

## 2017-12-31 DIAGNOSIS — R1031 Right lower quadrant pain: Secondary | ICD-10-CM | POA: Insufficient documentation

## 2017-12-31 DIAGNOSIS — Z87891 Personal history of nicotine dependence: Secondary | ICD-10-CM | POA: Insufficient documentation

## 2017-12-31 DIAGNOSIS — O34219 Maternal care for unspecified type scar from previous cesarean delivery: Secondary | ICD-10-CM

## 2017-12-31 DIAGNOSIS — O26892 Other specified pregnancy related conditions, second trimester: Secondary | ICD-10-CM | POA: Diagnosis not present

## 2017-12-31 DIAGNOSIS — Z3A25 25 weeks gestation of pregnancy: Secondary | ICD-10-CM | POA: Insufficient documentation

## 2017-12-31 DIAGNOSIS — Z8659 Personal history of other mental and behavioral disorders: Secondary | ICD-10-CM

## 2017-12-31 DIAGNOSIS — O9989 Other specified diseases and conditions complicating pregnancy, childbirth and the puerperium: Secondary | ICD-10-CM

## 2017-12-31 DIAGNOSIS — R1084 Generalized abdominal pain: Secondary | ICD-10-CM | POA: Diagnosis not present

## 2017-12-31 DIAGNOSIS — M7918 Myalgia, other site: Secondary | ICD-10-CM

## 2017-12-31 DIAGNOSIS — R109 Unspecified abdominal pain: Secondary | ICD-10-CM | POA: Diagnosis not present

## 2017-12-31 LAB — CBC
HCT: 34.8 % — ABNORMAL LOW (ref 36.0–46.0)
HEMOGLOBIN: 12.1 g/dL (ref 12.0–15.0)
MCH: 31.2 pg (ref 26.0–34.0)
MCHC: 34.8 g/dL (ref 30.0–36.0)
MCV: 89.7 fL (ref 80.0–100.0)
Platelets: 228 10*3/uL (ref 150–400)
RBC: 3.88 MIL/uL (ref 3.87–5.11)
RDW: 12.9 % (ref 11.5–15.5)
WBC: 6.7 10*3/uL (ref 4.0–10.5)
nRBC: 0 % (ref 0.0–0.2)

## 2017-12-31 LAB — URINALYSIS, ROUTINE W REFLEX MICROSCOPIC
Bilirubin Urine: NEGATIVE
Glucose, UA: NEGATIVE mg/dL
Hgb urine dipstick: NEGATIVE
Ketones, ur: NEGATIVE mg/dL
LEUKOCYTES UA: NEGATIVE
NITRITE: NEGATIVE
PROTEIN: NEGATIVE mg/dL
Specific Gravity, Urine: 1.005 (ref 1.005–1.030)
pH: 8 (ref 5.0–8.0)

## 2017-12-31 MED ORDER — LACTATED RINGERS IV BOLUS
1000.0000 mL | Freq: Once | INTRAVENOUS | Status: AC
  Administered 2017-12-31: 1000 mL via INTRAVENOUS

## 2017-12-31 MED ORDER — SODIUM CHLORIDE 0.9 % IV SOLN
8.0000 mg | Freq: Once | INTRAVENOUS | Status: AC
  Administered 2017-12-31: 8 mg via INTRAVENOUS
  Filled 2017-12-31: qty 4

## 2017-12-31 MED ORDER — OXYCODONE-ACETAMINOPHEN 5-325 MG PO TABS
1.0000 | ORAL_TABLET | Freq: Once | ORAL | Status: AC
  Administered 2017-12-31: 1 via ORAL
  Filled 2017-12-31: qty 1

## 2017-12-31 NOTE — Discharge Instructions (Signed)
Muscle Pain, Adult Muscle pain (myalgia) may be mild or severe. In most cases, the pain lasts only a short time and it goes away without treatment. It is normal to feel some muscle pain after starting a workout program. Muscles that have not been used often will be sore at first. Muscle pain may also be caused by many other things, including:  Overuse or muscle strain, especially if you are not in shape. This is the most common cause of muscle pain.  Injury.  Bruises.  Viruses, such as the flu.  Infectious diseases.  A chronic condition that causes muscle tenderness, fatigue, and headache (fibromyalgia).  A condition, such as lupus, in which the bodys disease-fighting system attacks other organs in the body (autoimmune or rheumatologic diseases).  Certain drugs, including ACE inhibitors and statins.  To diagnose the cause of your muscle pain, your health care provider will do a physical exam and ask questions about the pain and when it began. If you have not had muscle pain for very long, your health care provider may want to wait before doing much testing. If your muscle pain has lasted a long time, your health care provider may want to run tests right away. In some cases, this may include tests to rule out certain conditions or illnesses. Treatment for muscle pain depends on the cause. Home care is often enough to relieve muscle pain. Your health care provider may also prescribe anti-inflammatory medicine. Follow these instructions at home: Activity  If overuse is causing your muscle pain: ? Slow down your activities until the pain goes away. ? Do regular, gentle exercises if you are not usually active. ? Warm up before exercising. Stretch before and after exercising. This can help lower the risk of muscle pain.  Do not continue working out if the pain is very bad. Bad pain could mean that you have injured a muscle. Managing pain and discomfort   If directed, apply ice to the  sore muscle: ? Put ice in a plastic bag. ? Place a towel between your skin and the bag. ? Leave the ice on for 20 minutes, 2-3 times a day.  You may also alternate between applying ice and applying heat as told by your health care provider. To apply heat, use the heat source that your health care provider recommends, such as a moist heat pack or a heating pad. ? Place a towel between your skin and the heat source. ? Leave the heat on for 20-30 minutes. ? Remove the heat if your skin turns bright red. This is especially important if you are unable to feel pain, heat, or cold. You may have a greater risk of getting burned. Medicines  Take over-the-counter and prescription medicines only as told by your health care provider.  Do not drive or use heavy machinery while taking prescription pain medicine. Contact a health care provider if:  Your muscle pain gets worse and medicines do not help.  You have muscle pain that lasts longer than 3 days.  You have a rash or fever along with muscle pain.  You have muscle pain after a tick bite.  You have muscle pain while working out, even though you are in good physical condition.  You have redness, soreness, or swelling along with muscle pain.  You have muscle pain after starting a new medicine or changing the dose of a medicine. Get help right away if:  You have trouble breathing.  You have trouble swallowing.  You have  muscle pain along with a stiff neck, fever, and vomiting.  You have severe muscle weakness or cannot move part of your body. This information is not intended to replace advice given to you by your health care provider. Make sure you discuss any questions you have with your health care provider. Document Released: 01/25/2006 Document Revised: 09/23/2015 Document Reviewed: 07/26/2015 Elsevier Interactive Patient Education  2018 [employer]. Round Ligament Pain The round ligament is a cord of muscle and tissue that helps  to support the uterus. It can become a source of pain during pregnancy if it becomes stretched or twisted as the baby grows. The pain usually begins in the second trimester of pregnancy, and it can come and go until the baby is delivered. It is not a serious problem, and it does not cause harm to the baby. Round ligament pain is usually a short, sharp, and pinching pain, but it can also be a dull, lingering, and aching pain. The pain is felt in the lower side of the abdomen or in the groin. It usually starts deep in the groin and moves up to the outside of the hip area. Pain can occur with:  A sudden change in position.  Rolling over in bed.  Coughing or sneezing.  Physical activity.  Follow these instructions at home: Watch your condition for any changes. Take these steps to help with your pain:  When the pain starts, relax. Then try: ? Sitting down. ? Flexing your knees up to your abdomen. ? Lying on your side with one pillow under your abdomen and another pillow between your legs. ? Sitting in a warm bath for 15-20 minutes or until the pain goes away.  Take over-the-counter and prescription medicines only as told by your health care provider.  Move slowly when you sit and stand.  Avoid long walks if they cause pain.  Stop or lessen your physical activities if they cause pain.  Contact a health care provider if:  Your pain does not go away with treatment.  You feel pain in your back that you did not have before.  Your medicine is not helping. Get help right away if:  You develop a fever or chills.  You develop uterine contractions.  You develop vaginal bleeding.  You develop nausea or vomiting.  You develop diarrhea.  You have pain when you urinate. This information is not intended to replace advice given to you by your health care provider. Make sure you discuss any questions you have with your health care provider. Document Released: 12/13/2007 Document Revised:  08/11/2015 Document Reviewed: 05/12/2014 Elsevier Interactive Patient Education  [employer].

## 2017-12-31 NOTE — MAU Note (Signed)
Pt had been in previously with diarrhea and has gotten better since. Pt had tarry stool this morning with worsening abdominal pain. Pain is 8/10. No bleeding no LOF.

## 2017-12-31 NOTE — MAU Provider Note (Addendum)
History     CSN: [id number]  Arrival date and time: 12/31/17 1138   First Provider Initiated Contact with Patient 12/31/17 1222      Chief Complaint  Patient presents with  . Abdominal Pain   [id number] @25 .3 wks here with RLQ pain. Pain started around 10am. Describes as sharp and constant. Rates 7/10. Has not taken anything for it. Denies fevers. Associated sx are nausea, no vomiting. Had a hard stool yesterday and a tarry stool today. Was having diarrhea about 3 days ago. No VB, LOF, or ctx. Good FM. 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 She also admit to lifting her son (30 lbs) this am before the pain started. Denies falls or injury.   OB History    Gravida  6   Para  2   Term  2   Preterm      AB  3   Living  2     SAB  3   TAB      Ectopic      Multiple  0   Live Births  2           Past Medical History:  Diagnosis Date  . Anxiety   . Depression    since 12/18 from [facility]AB  . PTSD (post-traumatic stress disorder) 2019    Past Surgical History:  Procedure Laterality Date  . APPENDECTOMY    . CESAREAN SECTION N/A 12/30/2015   Procedure: CESAREAN SECTION;  Surgeon: Adam Phenix, MD;  Location: [facility];  Service: Obstetrics;  Laterality: N/A;  . CHOLECYSTECTOMY      Family History  Problem Relation Age of Onset  . Cancer Maternal Aunt   . Cancer Maternal Uncle   . Diabetes Paternal Aunt   . Diabetes Paternal Uncle   . Diabetes Maternal Grandmother   . Cancer Maternal Grandfather   . Cancer Paternal Grandmother   . Diabetes Paternal Grandmother   . Diabetes Paternal Grandfather     Social History   Tobacco Use  . Smoking status: Former Games developer  . Smokeless tobacco: Former Engineer, water Use Topics  . Alcohol use: No  . Drug use: No    Allergies:  Allergies  Allergen Reactions  . Divalproex Sodium Other (See Comments)    Reaction:  Memory loss   . Demerol [Meperidine]  Hives    Medications Prior to Admission  Medication Sig Dispense Refill Last Dose  . aspirin 81 MG chewable tablet Chew 1 tablet (81 mg total) by mouth daily. 60 tablet 3 Taking  . citalopram (CELEXA) 20 MG tablet Take 1 tablet (20 mg total) by mouth daily. 30 tablet 3 Taking  . cyclobenzaprine (FLEXERIL) 10 MG tablet Take 1 tablet (10 mg total) by mouth every 8 (eight) hours as needed for muscle spasms. 30 tablet 0 Taking  . Elastic Bandages & Supports (COMFORT FIT MATERNITY SUPP MED) MISC 1 Device by Does not apply route daily. 1 each 0 Taking  . Prenatal Vit-Fe Fumarate-FA (MULTIVITAMIN-PRENATAL) 27-0.8 MG TABS tablet Take 1 tablet by mouth daily at 12 noon.   Taking    Review of Systems  Constitutional: Negative for chills and fever.  Gastrointestinal: Positive for abdominal pain and nausea. Negative for constipation, diarrhea and vomiting.  Genitourinary: Negative for dysuria, hematuria, urgency, vaginal bleeding and vaginal discharge.  Musculoskeletal: Positive for back pain.   Physical Exam   Blood pressure 109/72, pulse 66, temperature 98  F (36.7 C), temperature source Oral, last menstrual period 12/19/2016, SpO2 99 %, unknown if currently breastfeeding.  Physical Exam  Nursing note and vitals reviewed. Constitutional: She is oriented to person, place, and time. She appears well-developed and well-nourished. No distress.  HENT:  Head: Normocephalic and atraumatic.  Neck: Normal range of motion.  Cardiovascular: Normal rate.  Respiratory: Effort normal. No respiratory distress.  GI: Soft. She exhibits no distension and no mass. There is tenderness. There is rebound and CVA tenderness. There is no rigidity and no guarding.  gravid  Genitourinary:  Genitourinary Comments: VE: closed/long  Musculoskeletal: Normal range of motion.  Neurological: She is alert and oriented to person, place, and time.  Skin: Skin is warm and dry.  Psychiatric: She has a normal mood and affect.   EFM: 115 bpm, mod variability, + accels, no decels Toco: none  Results for orders placed or performed during the hospital encounter of 12/31/17 (from the past 24 hour(s))  Urinalysis, Routine w reflex microscopic     Status: Abnormal   Collection Time: 12/31/17 12:27 PM  Result Value Ref Range   Color, Urine STRAW (A) YELLOW   APPearance CLEAR CLEAR   Specific Gravity, Urine 1.005 1.005 - 1.030   pH 8.0 5.0 - 8.0   Glucose, UA NEGATIVE NEGATIVE mg/dL   Hgb urine dipstick NEGATIVE NEGATIVE   Bilirubin Urine NEGATIVE NEGATIVE   Ketones, ur NEGATIVE NEGATIVE mg/dL   Protein, ur NEGATIVE NEGATIVE mg/dL   Nitrite NEGATIVE NEGATIVE   Leukocytes, UA NEGATIVE NEGATIVE  CBC     Status: Abnormal   Collection Time: 12/31/17 12:41 PM  Result Value Ref Range   WBC 6.7 4.0 - 10.5 K/uL   RBC 3.88 3.87 - 5.11 MIL/uL   Hemoglobin 12.1 12.0 - 15.0 g/dL   HCT 16.1 (L) 09.6 - 04.5 %   MCV 89.7 80.0 - 100.0 fL   MCH 31.2 26.0 - 34.0 pg   MCHC 34.8 30.0 - 36.0 g/dL   RDW 40.9 81.1 - 91.4 %   Platelets 228 150 - 400 K/uL   nRBC 0.0 0.0 - 0.2 %   MAU Course  Procedures LR Percocet Zofran  MDM Labs ordered and reviewed. No evidence of acute abdominal process. Nausea improved. Pain improved. Likely MSK. Stable for discharge home.   Assessment and Plan   1. [redacted] weeks gestation of pregnancy   2. History of postpartum depression, currently pregnant   3. History of cesarean delivery, antepartum   4. Muscular abdominal pain in right lower quadrant    Discharge home [facility] as [facility] PTL precautions Return for worsening pain OOW today and tomorrow  Allergies as of 12/31/2017      Reactions   Divalproex Sodium Other (See Comments)   Reaction:  Memory loss    Demerol [meperidine] Hives      Medication List    TAKE these medications   aspirin 81 MG chewable tablet Chew 1 tablet (81 mg total) by mouth daily.   citalopram 20 MG tablet Commonly known as:  CELEXA Take 1  tablet (20 mg total) by mouth daily.   COMFORT FIT MATERNITY SUPP MED Misc 1 Device by Does not apply route daily.   cyclobenzaprine 10 MG tablet Commonly known as:  FLEXERIL Take 1 tablet (10 mg total) by mouth every 8 (eight) hours as needed for muscle spasms.   multivitamin-prenatal 27-0.8 MG Tabs tablet Take 1 tablet by mouth daily at 12 noon.  Donette Larry, CNM 12/31/2017, 12:33 PM

## 2018-01-20 ENCOUNTER — Ambulatory Visit (INDEPENDENT_AMBULATORY_CARE_PROVIDER_SITE_OTHER): Payer: Medicaid Other | Admitting: Obstetrics & Gynecology

## 2018-01-20 VITALS — BP 106/64 | HR 70 | Wt 195.0 lb

## 2018-01-20 DIAGNOSIS — Z3480 Encounter for supervision of other normal pregnancy, unspecified trimester: Secondary | ICD-10-CM | POA: Diagnosis not present

## 2018-01-20 DIAGNOSIS — Z3483 Encounter for supervision of other normal pregnancy, third trimester: Secondary | ICD-10-CM

## 2018-01-20 DIAGNOSIS — Z23 Encounter for immunization: Secondary | ICD-10-CM

## 2018-01-20 DIAGNOSIS — O9989 Other specified diseases and conditions complicating pregnancy, childbirth and the puerperium: Secondary | ICD-10-CM

## 2018-01-20 DIAGNOSIS — Z8659 Personal history of other mental and behavioral disorders: Secondary | ICD-10-CM

## 2018-01-20 NOTE — Patient Instructions (Signed)
Contraception Choices Contraception, also called birth control, refers to methods or devices that prevent pregnancy. Hormonal methods Contraceptive implant A contraceptive implant is a thin, plastic tube that contains a hormone. It is inserted into the upper part of the arm. It can remain in place for up to 3 years. Progestin-only injections Progestin-only injections are injections of progestin, a synthetic form of the hormone progesterone. They are given every 3 months by a health care provider. Birth control pills Birth control pills are pills that contain hormones that prevent pregnancy. They must be taken once a day, preferably at the same time each day. Birth control patch The birth control patch contains hormones that prevent pregnancy. It is placed on the skin and must be changed once a week for three weeks and removed on the fourth week. A prescription is needed to use this method of contraception. Vaginal ring A vaginal ring contains hormones that prevent pregnancy. It is placed in the vagina for three weeks and removed on the fourth week. After that, the process is repeated with a new ring. A prescription is needed to use this method of contraception. Emergency contraceptive Emergency contraceptives prevent pregnancy after unprotected sex. They come in pill form and can be taken up to 5 days after sex. They work best the sooner they are taken after having sex. Most emergency contraceptives are available without a prescription. This method should not be used as your only form of birth control. Barrier methods Female condom A female condom is a thin sheath that is worn over the penis during sex. Condoms keep sperm from going inside a woman's body. They can be used with a spermicide to increase their effectiveness. They should be disposed after a single use. Female condom A female condom is a soft, loose-fitting sheath that is put into the vagina before sex. The condom keeps sperm from going  inside a woman's body. They should be disposed after a single use. Diaphragm A diaphragm is a soft, dome-shaped barrier. It is inserted into the vagina before sex, along with a spermicide. The diaphragm blocks sperm from entering the uterus, and the spermicide kills sperm. A diaphragm should be left in the vagina for 6-8 hours after sex and removed within 24 hours. A diaphragm is prescribed and fitted by a health care provider. A diaphragm should be replaced every 1-2 years, after giving birth, after gaining more than 15 lb (6.8 kg), and after pelvic surgery. Cervical cap A cervical cap is a round, soft latex or plastic cup that fits over the cervix. It is inserted into the vagina before sex, along with spermicide. It blocks sperm from entering the uterus. The cap should be left in place for 6-8 hours after sex and removed within 48 hours. A cervical cap must be prescribed and fitted by a health care provider. It should be replaced every 2 years. Sponge A sponge is a soft, circular piece of polyurethane foam with spermicide on it. The sponge helps block sperm from entering the uterus, and the spermicide kills sperm. To use it, you make it wet and then insert it into the vagina. It should be inserted before sex, left in for at least 6 hours after sex, and removed and thrown away within 30 hours. Spermicides Spermicides are chemicals that kill or block sperm from entering the cervix and uterus. They can come as a cream, jelly, suppository, foam, or tablet. A spermicide should be inserted into the vagina with an applicator at least 10-15 minutes before   sex to allow time for it to work. The process must be repeated every time you have sex. Spermicides do not require a prescription. Intrauterine contraception Intrauterine device (IUD) An IUD is a T-shaped device that is put in a woman's uterus. There are two types:  Hormone IUD.This type contains progestin, a synthetic form of the hormone progesterone. This  type can stay in place for 3-5 years.  Copper IUD.This type is wrapped in copper wire. It can stay in place for 10 years.  Permanent methods of contraception Female tubal ligation In this method, a woman's fallopian tubes are sealed, tied, or blocked during surgery to prevent eggs from traveling to the uterus. Hysteroscopic sterilization In this method, a small, flexible insert is placed into each fallopian tube. The inserts cause scar tissue to form in the fallopian tubes and block them, so sperm cannot reach an egg. The procedure takes about 3 months to be effective. Another form of birth control must be used during those 3 months. Female sterilization This is a procedure to tie off the tubes that carry sperm (vasectomy). After the procedure, the man can still ejaculate fluid (semen). Natural planning methods Natural family planning In this method, a couple does not have sex on days when the woman could become pregnant. Calendar method This means keeping track of the length of each menstrual cycle, identifying the days when pregnancy can happen, and not having sex on those days. Ovulation method In this method, a couple avoids sex during ovulation. Symptothermal method This method involves not having sex during ovulation. The woman typically checks for ovulation by watching changes in her temperature and in the consistency of cervical mucus. Post-ovulation method In this method, a couple waits to have sex until after ovulation. Summary  Contraception, also called birth control, means methods or devices that prevent pregnancy.  Hormonal methods of contraception include implants, injections, pills, patches, vaginal rings, and emergency contraceptives.  Barrier methods of contraception can include female condoms, female condoms, diaphragms, cervical caps, sponges, and spermicides.  There are two types of IUDs (intrauterine devices). An IUD can be put in a woman's uterus to prevent pregnancy  for 3-5 years.  Permanent sterilization can be done through a procedure for males, females, or both.  Natural family planning methods involve not having sex on days when the woman could become pregnant. This information is not intended to replace advice given to you by your health care provider. Make sure you discuss any questions you have with your health care provider. Document Released: 03/05/2005 Document Revised: 04/07/2016 Document Reviewed: 04/07/2016 Elsevier Interactive Patient Education  2018 [employer]. Levonorgestrel intrauterine device (IUD) What is this medicine? LEVONORGESTREL IUD (LEE voe nor jes trel) is a contraceptive (birth control) device. The device is placed inside the uterus by a healthcare professional. It is used to prevent pregnancy. This device can also be used to treat heavy bleeding that occurs during your period. This medicine may be used for other purposes; ask your health care provider or pharmacist if you have questions. COMMON BRAND NAME(S): Minette Headland What should I tell my health care provider before I take this medicine? They need to know if you have any of these conditions: -abnormal Pap smear -cancer of the breast, uterus, or cervix -diabetes -endometritis -genital or pelvic infection now or in the past -have more than one sexual partner or your partner has more than one partner -heart disease -history of an ectopic or tubal pregnancy -immune system problems -IUD  in place -liver disease or tumor -problems with blood clots or take blood-thinners -seizures -use intravenous drugs -uterus of unusual shape -vaginal bleeding that has not been explained -an unusual or allergic reaction to levonorgestrel, other hormones, silicone, or polyethylene, medicines, foods, dyes, or preservatives -pregnant or trying to get pregnant -breast-feeding How should I use this medicine? This device is placed inside the uterus by a health care  professional. Talk to your pediatrician regarding the use of this medicine in children. Special care may be needed. Overdosage: If you think you have taken too much of this medicine contact a poison control center or emergency room at once. NOTE: This medicine is only for you. Do not share this medicine with others. What if I miss a dose? This does not apply. Depending on the brand of device you have inserted, the device will need to be replaced every 3 to 5 years if you wish to continue using this type of birth control. What may interact with this medicine? Do not take this medicine with any of the following medications: -amprenavir -bosentan -fosamprenavir This medicine may also interact with the following medications: -aprepitant -armodafinil -barbiturate medicines for inducing sleep or treating seizures -bexarotene -boceprevir -griseofulvin -medicines to treat seizures like carbamazepine, ethotoin, felbamate, oxcarbazepine, phenytoin, topiramate -modafinil -pioglitazone -rifabutin -rifampin -rifapentine -some medicines to treat HIV infection like atazanavir, efavirenz, indinavir, lopinavir, nelfinavir, tipranavir, ritonavir -St. John's wort -warfarin This list may not describe all possible interactions. Give your health care provider a list of all the medicines, herbs, non-prescription drugs, or dietary supplements you use. Also tell them if you smoke, drink alcohol, or use illegal drugs. Some items may interact with your medicine. What should I watch for while using this medicine? Visit your doctor or health care professional for regular check ups. See your doctor if you or your partner has sexual contact with others, becomes HIV positive, or gets a sexual transmitted disease. This product does not protect you against HIV infection (AIDS) or other sexually transmitted diseases. You can check the placement of the IUD yourself by reaching up to the top of your vagina with clean  fingers to feel the threads. Do not pull on the threads. It is a good habit to check placement after each menstrual period. Call your doctor right away if you feel more of the IUD than just the threads or if you cannot feel the threads at all. The IUD may come out by itself. You may become pregnant if the device comes out. If you notice that the IUD has come out use a backup birth control method like condoms and call your health care provider. Using tampons will not change the position of the IUD and are okay to use during your period. This IUD can be safely scanned with magnetic resonance imaging (MRI) only under specific conditions. Before you have an MRI, tell your healthcare provider that you have an IUD in place, and which type of IUD you have in place. What side effects may I notice from receiving this medicine? Side effects that you should report to your doctor or health care professional as soon as possible: -allergic reactions like skin rash, itching or hives, swelling of the face, lips, or tongue -fever, flu-like symptoms -genital sores -high blood pressure -no menstrual period for 6 weeks during use -pain, swelling, warmth in the leg -pelvic pain or tenderness -severe or sudden headache -signs of pregnancy -stomach cramping -sudden shortness of breath -trouble with balance, talking, or walking -unusual vaginal  bleeding, discharge -yellowing of the eyes or skin Side effects that usually do not require medical attention (report to your doctor or health care professional if they continue or are bothersome): -acne -breast pain -change in sex drive or performance -changes in weight -cramping, dizziness, or faintness while the device is being inserted -headache -irregular menstrual bleeding within first 3 to 6 months of use -nausea This list may not describe all possible side effects. Call your doctor for medical advice about side effects. You may report side effects to FDA at  1-800-FDA-1088. Where should I keep my medicine? This does not apply. NOTE: This sheet is a summary. It may not cover all possible information. If you have questions about this medicine, talk to your doctor, pharmacist, or health care provider.  2018 Elsevier/Gold Standard (2015-12-16 14:14:56)  

## 2018-01-20 NOTE — Progress Notes (Signed)
   PRENATAL VISIT NOTE  Subjective:  Laura [name] is a 22 y.o. [phone] at [redacted]w[redacted]d being seen today for ongoing prenatal care.  She is currently monitored for the following issues for this high-risk pregnancy and has Supervision of other normal pregnancy, antepartum; MDD (major depressive disorder), single episode, severe , no psychosis (HCC); Recurrent pregnancy loss; History of postpartum depression, currently pregnant; and History of cesarean delivery, antepartum on their problem list.  Patient reports lightneadedness with standing long periods..  Contractions: Irritability. Vag. Bleeding: None.  Movement: Present. Denies leaking of fluid.   The following portions of the patient's history were reviewed and updated as appropriate: allergies, current medications, past family history, past medical history, past social history, past surgical history and problem list. Problem list updated.  Objective:   Vitals:   01/20/18 0835  BP: 106/64  Pulse: 70  Weight: 195 lb (88.5 kg)    Fetal Status: Fetal Heart Rate (bpm): 127 Fundal Height: 30 cm Movement: Present     General:  Alert, oriented and cooperative. Patient is in no acute distress.  Skin: Skin is warm and dry. No rash noted.   Cardiovascular: Normal heart rate noted  Respiratory: Normal respiratory effort, no problems with respiration noted  Abdomen: Soft, gravid, appropriate for gestational age.  Pain/Pressure: Present     Pelvic: Cervical exam performed Dilation: Closed Effacement (%): Thick Station: Ballotable  Extremities: Normal range of motion.  Edema: Trace  Mental Status: Normal mood and affect. Normal behavior. Normal judgment and thought content.   Assessment and Plan:  Pregnancy: [id number] at [redacted]w[redacted]d  1. Supervision of other normal pregnancy, antepartum - 2Hr GTT w/ 1 Hr Carpenter 75 g - CBC - RPR - HIV antibody (with reflex)  2. History of postpartum depression, currently pregnant -On Celxa and doing well  3.   Lightheadedness -Occurs with standing long periods of time.  Pt will increase fluids and check cbc today with labs.  No syncope.    4.  RLQ pain -Saw [facility] on 10/15.  Cleared and better with abdominal support binder.  If continues, can get [place] to look for ovaries / cysts.  Preterm labor symptoms and general obstetric precautions including but not limited to vaginal bleeding, contractions, leaking of fluid and fetal movement were reviewed in detail with the patient. Please refer to After Visit Summary for other counseling recommendations.  Return in about 2 weeks (around 02/03/2018).  No future appointments.  Elsie Lincoln, MD

## 2018-01-21 LAB — CBC
HEMATOCRIT: 37.3 % (ref 35.0–45.0)
HEMOGLOBIN: 12.7 g/dL (ref 11.7–15.5)
MCH: 30.8 pg (ref 27.0–33.0)
MCHC: 34 g/dL (ref 32.0–36.0)
MCV: 90.5 fL (ref 80.0–100.0)
MPV: 11.1 fL (ref 7.5–12.5)
Platelets: 257 10*3/uL (ref 140–400)
RBC: 4.12 10*6/uL (ref 3.80–5.10)
RDW: 12.6 % (ref 11.0–15.0)
WBC: 5.9 10*3/uL (ref 3.8–10.8)

## 2018-01-21 LAB — 2HR GTT W 1 HR, CARPENTER, 75 G
Glucose, 1 Hr, Gest: 128 mg/dL (ref 65–179)
Glucose, 2 Hr, Gest: 108 mg/dL (ref 65–152)
Glucose, Fasting, Gest: 72 mg/dL (ref 65–91)

## 2018-01-21 LAB — RPR: RPR: NONREACTIVE

## 2018-01-21 LAB — HIV ANTIBODY (ROUTINE TESTING W REFLEX): HIV 1&2 Ab, 4th Generation: NONREACTIVE

## 2018-01-29 ENCOUNTER — Ambulatory Visit (INDEPENDENT_AMBULATORY_CARE_PROVIDER_SITE_OTHER): Payer: Medicaid Other | Admitting: *Deleted

## 2018-01-29 DIAGNOSIS — O4703 False labor before 37 completed weeks of gestation, third trimester: Secondary | ICD-10-CM | POA: Diagnosis not present

## 2018-01-29 DIAGNOSIS — O9989 Other specified diseases and conditions complicating pregnancy, childbirth and the puerperium: Principal | ICD-10-CM

## 2018-01-29 DIAGNOSIS — M549 Dorsalgia, unspecified: Secondary | ICD-10-CM

## 2018-01-29 DIAGNOSIS — Z3A29 29 weeks gestation of pregnancy: Secondary | ICD-10-CM | POA: Diagnosis not present

## 2018-01-29 NOTE — Progress Notes (Signed)
Pt called stating that she is having "really bad back spasms" and wants to be seen to make sure they are not contractions.  Pt on NST for 20 minutes and had several 10x10 but no contractions.  She has been given Flexeril in the past but doesn't like to use it.  I suggested that she use heat to back, the Flexeril and to use her supportive belly band.  She states that she hardly got to sit down at work yesterday and was up and down all throughout the day.  Instructed to go home to the heat and Flexeril and try to get some rest.  She agrees that she will try this.  If pain increase or she see decrease in fetal activity she will call or go to [facility].

## 2018-01-30 NOTE — Progress Notes (Signed)
RN to call pt today and see how she is feeling.  If still having back pain, should come in for cervical check.  Can also refer to Dr. Adrian BlackwaterStinson for one visit and see if he can adjust her back.  I have reviewed the chart and agree with nursing staff's documentation of this patient's encounter.  Elsie LincolnKelly Leggett, MD 01/30/2018 11:06 AM

## 2018-02-04 ENCOUNTER — Ambulatory Visit (INDEPENDENT_AMBULATORY_CARE_PROVIDER_SITE_OTHER): Payer: Medicaid Other | Admitting: Advanced Practice Midwife

## 2018-02-04 ENCOUNTER — Encounter: Payer: Self-pay | Admitting: Advanced Practice Midwife

## 2018-02-04 VITALS — BP 106/73 | HR 74 | Wt 203.0 lb

## 2018-02-04 DIAGNOSIS — O99891 Other specified diseases and conditions complicating pregnancy: Secondary | ICD-10-CM

## 2018-02-04 DIAGNOSIS — O9989 Other specified diseases and conditions complicating pregnancy, childbirth and the puerperium: Secondary | ICD-10-CM

## 2018-02-04 DIAGNOSIS — M549 Dorsalgia, unspecified: Secondary | ICD-10-CM

## 2018-02-04 DIAGNOSIS — O34219 Maternal care for unspecified type scar from previous cesarean delivery: Secondary | ICD-10-CM

## 2018-02-04 DIAGNOSIS — Z8659 Personal history of other mental and behavioral disorders: Secondary | ICD-10-CM

## 2018-02-04 DIAGNOSIS — Z3483 Encounter for supervision of other normal pregnancy, third trimester: Secondary | ICD-10-CM

## 2018-02-04 DIAGNOSIS — Z3480 Encounter for supervision of other normal pregnancy, unspecified trimester: Secondary | ICD-10-CM

## 2018-02-04 NOTE — Patient Instructions (Addendum)
For pregnancy support: Prenatal Cradle or Mini Cradle   Third Trimester of Pregnancy The third trimester is from week 28 through week 40 (months 7 through 9). The third trimester is a time when the unborn baby (fetus) is growing rapidly. At the end of the ninth month, the fetus is about 20 inches in length and weighs 6-10 pounds. Body changes during your third trimester Your body will continue to go through many changes during pregnancy. The changes vary from woman to woman. During the third trimester:  Your weight will continue to increase. You can expect to gain 25-35 pounds (11-16 kg) by the end of the pregnancy.  You may begin to get stretch marks on your hips, abdomen, and breasts.  You may urinate more often because the fetus is moving lower into your pelvis and pressing on your bladder.  You may develop or continue to have heartburn. This is caused by increased hormones that slow down muscles in the digestive tract.  You may develop or continue to have constipation because increased hormones slow digestion and cause the muscles that push waste through your intestines to relax.  You may develop hemorrhoids. These are swollen veins (varicose veins) in the rectum that can itch or be painful.  You may develop swollen, bulging veins (varicose veins) in your legs.  You may have increased body aches in the pelvis, back, or thighs. This is due to weight gain and increased hormones that are relaxing your joints.  You may have changes in your hair. These can include thickening of your hair, rapid growth, and changes in texture. Some women also have hair loss during or after pregnancy, or hair that feels dry or thin. Your hair will most likely return to normal after your baby is born.  Your breasts will continue to grow and they will continue to become tender. A yellow fluid (colostrum) may leak from your breasts. This is the first milk you are producing for your baby.  Your belly button may  stick out.  You may notice more swelling in your hands, face, or ankles.  You may have increased tingling or numbness in your hands, arms, and legs. The skin on your belly may also feel numb.  You may feel short of breath because of your expanding uterus.  You may have more problems sleeping. This can be caused by the size of your belly, increased need to urinate, and an increase in your body's metabolism.  You may notice the fetus "dropping," or moving lower in your abdomen (lightening).  You may have increased vaginal discharge.  You may notice your joints feel loose and you may have pain around your pelvic bone.  What to expect at prenatal visits You will have prenatal exams every 2 weeks until week 36. Then you will have weekly prenatal exams. During a routine prenatal visit:  You will be weighed to make sure you and the baby are growing normally.  Your blood pressure will be taken.  Your abdomen will be measured to track your baby's growth.  The fetal heartbeat will be listened to.  Any test results from the previous visit will be discussed.  You may have a cervical check near your due date to see if your cervix has softened or thinned (effaced).  You will be tested for Group B streptococcus. This happens between 35 and 37 weeks.  Your health care provider may ask you:  What your birth plan is.  How you are feeling.  If you  are feeling the baby move.  If you have had any abnormal symptoms, such as leaking fluid, bleeding, severe headaches, or abdominal cramping.  If you are using any tobacco products, including cigarettes, chewing tobacco, and electronic cigarettes.  If you have any questions.  Other tests or screenings that may be performed during your third trimester include:  Blood tests that check for low iron levels (anemia).  Fetal testing to check the health, activity level, and growth of the fetus. Testing is done if you have certain medical conditions  or if there are problems during the pregnancy.  Nonstress test (NST). This test checks the health of your baby to make sure there are no signs of problems, such as the baby not getting enough oxygen. During this test, a belt is placed around your belly. The baby is made to move, and its heart rate is monitored during movement.  What is false labor? False labor is a condition in which you feel small, irregular tightenings of the muscles in the womb (contractions) that usually go away with rest, changing position, or drinking water. These are called Braxton Hicks contractions. Contractions may last for hours, days, or even weeks before true labor sets in. If contractions come at regular intervals, become more frequent, increase in intensity, or become painful, you should see your health care provider. What are the signs of labor?  Abdominal cramps.  Regular contractions that start at 10 minutes apart and become stronger and more frequent with time.  Contractions that start on the top of the uterus and spread down to the lower abdomen and back.  Increased pelvic pressure and dull back pain.  A watery or bloody mucus discharge that comes from the vagina.  Leaking of amniotic fluid. This is also known as your "water breaking." It could be a slow trickle or a gush. Let your health care provider know if it has a color or strange odor. If you have any of these signs, call your health care provider right away, even if it is before your due date. Follow these instructions at home: Medicines  Follow your health care provider's instructions regarding medicine use. Specific medicines may be either safe or unsafe to take during pregnancy.  Take a prenatal vitamin that contains at least 600 micrograms (mcg) of folic acid.  If you develop constipation, try taking a stool softener if your health care provider approves. Eating and drinking  Eat a balanced diet that includes fresh fruits and vegetables,  whole grains, good sources of protein such as meat, eggs, or tofu, and low-fat dairy. Your health care provider will help you determine the amount of weight gain that is right for you.  Avoid raw meat and uncooked cheese. These carry germs that can cause birth defects in the baby.  If you have low calcium intake from food, talk to your health care provider about whether you should take a daily calcium supplement.  Eat four or five small meals rather than three large meals a day.  Limit foods that are high in fat and processed sugars, such as fried and sweet foods.  To prevent constipation: ? Drink enough fluid to keep your urine clear or pale yellow. ? Eat foods that are high in fiber, such as fresh fruits and vegetables, whole grains, and beans. Activity  Exercise only as directed by your health care provider. Most women can continue their usual exercise routine during pregnancy. Try to exercise for 30 minutes at least 5 days a week.  Stop exercising if you experience uterine contractions.  Avoid heavy lifting.  Do not exercise in extreme heat or humidity, or at high altitudes.  Wear low-heel, comfortable shoes.  Practice good posture.  You may continue to have sex unless your health care provider tells you otherwise. Relieving pain and discomfort  Take frequent breaks and rest with your legs elevated if you have leg cramps or low back pain.  Take warm sitz baths to soothe any pain or discomfort caused by hemorrhoids. Use hemorrhoid cream if your health care provider approves.  Wear a good support bra to prevent discomfort from breast tenderness.  If you develop varicose veins: ? Wear support pantyhose or compression stockings as told by your healthcare provider. ? Elevate your feet for 15 minutes, 3-4 times a day. Prenatal care  Write down your questions. Take them to your prenatal visits.  Keep all your prenatal visits as told by your health care provider. This is  important. Safety  Wear your seat belt at all times when driving.  Make a list of emergency phone numbers, including numbers for family, friends, the hospital, and police and fire departments. General instructions  Avoid cat litter boxes and soil used by cats. These carry germs that can cause birth defects in the baby. If you have a cat, ask someone to clean the litter box for you.  Do not travel far distances unless it is absolutely necessary and only with the approval of your health care provider.  Do not use hot tubs, steam rooms, or saunas.  Do not drink alcohol.  Do not use any products that contain nicotine or tobacco, such as cigarettes and e-cigarettes. If you need help quitting, ask your health care provider.  Do not use any medicinal herbs or unprescribed drugs. These chemicals affect the formation and growth of the baby.  Do not douche or use tampons or scented sanitary pads.  Do not cross your legs for long periods of time.  To prepare for the arrival of your baby: ? Take prenatal classes to understand, practice, and ask questions about labor and delivery. ? Make a trial run to the hospital. ? Visit the hospital and tour the maternity area. ? Arrange for maternity or paternity leave through employers. ? Arrange for family and friends to take care of pets while you are in the hospital. ? Purchase a rear-facing car seat and make sure you know how to install it in your car. ? Pack your hospital bag. ? Prepare the baby's nursery. Make sure to remove all pillows and stuffed animals from the baby's crib to prevent suffocation.  Visit your dentist if you have not gone during your pregnancy. Use a soft toothbrush to brush your teeth and be gentle when you floss. Contact a health care provider if:  You are unsure if you are in labor or if your water has broken.  You become dizzy.  You have mild pelvic cramps, pelvic pressure, or nagging pain in your abdominal area.  You  have lower back pain.  You have persistent nausea, vomiting, or diarrhea.  You have an unusual or bad smelling vaginal discharge.  You have pain when you urinate. Get help right away if:  Your water breaks before 37 weeks.  You have regular contractions less than 5 minutes apart before 37 weeks.  You have a fever.  You are leaking fluid from your vagina.  You have spotting or bleeding from your vagina.  You have severe abdominal pain or  cramping.  You have rapid weight loss or weight gain.  You have shortness of breath with chest pain.  You notice sudden or extreme swelling of your face, hands, ankles, feet, or legs.  Your baby makes fewer than 10 movements in 2 hours.  You have severe headaches that do not go away when you take medicine.  You have vision changes. Summary  The third trimester is from week 28 through week 40, months 7 through 9. The third trimester is a time when the unborn baby (fetus) is growing rapidly.  During the third trimester, your discomfort may increase as you and your baby continue to gain weight. You may have abdominal, leg, and back pain, sleeping problems, and an increased need to urinate.  During the third trimester your breasts will keep growing and they will continue to become tender. A yellow fluid (colostrum) may leak from your breasts. This is the first milk you are producing for your baby.  False labor is a condition in which you feel small, irregular tightenings of the muscles in the womb (contractions) that eventually go away. These are called Braxton Hicks contractions. Contractions may last for hours, days, or even weeks before true labor sets in.  Signs of labor can include: abdominal cramps; regular contractions that start at 10 minutes apart and become stronger and more frequent with time; watery or bloody mucus discharge that comes from the vagina; increased pelvic pressure and dull back pain; and leaking of amniotic fluid. This  information is not intended to replace advice given to you by your health care provider. Make sure you discuss any questions you have with your health care provider. Document Released: 02/27/2001 Document Revised: 08/11/2015 Document Reviewed: 05/06/2012 Elsevier Interactive Patient Education  2017 [employer]

## 2018-02-04 NOTE — Progress Notes (Signed)
   PRENATAL VISIT NOTE  Subjective:  Laura [name] is a 22 y.o. [id number] at 7439w3d being seen today for ongoing prenatal care.  She is currently monitored for the following issues for this low-risk pregnancy and has Supervision of other normal pregnancy, antepartum; MDD (major depressive disorder), single episode, severe , no psychosis (HCC); Recurrent pregnancy loss; History of postpartum depression, currently pregnant; and History of cesarean delivery, antepartum on their problem list.  Patient reports back pain.  Contractions: Irritability. Vag. Bleeding: None.  Movement: Present. Denies leaking of fluid.   The following portions of the patient's history were reviewed and updated as appropriate: allergies, current medications, past family history, past medical history, past social history, past surgical history and problem list. Problem list updated.  Objective:   Vitals:   02/04/18 1533  BP: 106/73  Pulse: 74  Weight: 92.1 kg    Fetal Status: Fetal Heart Rate (bpm): 129   Movement: Present     General:  Alert, oriented and cooperative. Patient is in no acute distress.  Skin: Skin is warm and dry. No rash noted.   Cardiovascular: Normal heart rate noted  Respiratory: Normal respiratory effort, no problems with respiration noted  Abdomen: Soft, gravid, appropriate for gestational age.  Pain/Pressure: Present     Pelvic: Cervical exam deferred        Extremities: Normal range of motion.  Edema: Trace  Mental Status: Normal mood and affect. Normal behavior. Normal judgment and thought content.   Assessment and Plan:  Pregnancy: [id number] at 3239w3d  1. Supervision of other normal pregnancy, antepartum --Anticipatory guidance about next visits/weeks of pregnancy given. --Discussed contraception, pt interested in IUD.  2. History of postpartum depression, currently pregnant --On Celexa, doing well.  Will continue postpartum.   3. History of cesarean delivery, antepartum --Plans  TOLAC, consent signed.  4.  Back pain in pregnancy --Has support belt but it is uncomfortable. Reviewed options online with pt, recommendations made.  Pt to find more comfortable belt to wear daily.  Preterm labor symptoms and general obstetric precautions including but not limited to vaginal bleeding, contractions, leaking of fluid and fetal movement were reviewed in detail with the patient. Please refer to After Visit Summary for other counseling recommendations.  No follow-ups on file.  No future appointments.  Sharen CounterLisa Leftwich-Kirby, CNM

## 2018-02-09 ENCOUNTER — Encounter (HOSPITAL_COMMUNITY): Payer: Self-pay | Admitting: *Deleted

## 2018-02-09 ENCOUNTER — Inpatient Hospital Stay (HOSPITAL_COMMUNITY)
Admission: AD | Admit: 2018-02-09 | Discharge: 2018-02-09 | Disposition: A | Discharge status: Home / Self Care | Payer: Medicaid Other | Source: Ambulatory Visit | Attending: Obstetrics & Gynecology | Admitting: Obstetrics & Gynecology

## 2018-02-09 DIAGNOSIS — R42 Dizziness and giddiness: Secondary | ICD-10-CM | POA: Diagnosis not present

## 2018-02-09 DIAGNOSIS — O26893 Other specified pregnancy related conditions, third trimester: Secondary | ICD-10-CM

## 2018-02-09 DIAGNOSIS — H539 Unspecified visual disturbance: Secondary | ICD-10-CM | POA: Insufficient documentation

## 2018-02-09 DIAGNOSIS — Z3A31 31 weeks gestation of pregnancy: Secondary | ICD-10-CM | POA: Diagnosis not present

## 2018-02-09 DIAGNOSIS — Z3689 Encounter for other specified antenatal screening: Secondary | ICD-10-CM

## 2018-02-09 HISTORY — DX: Other reaction to spinal and lumbar puncture: G97.1

## 2018-02-09 HISTORY — DX: Meningitis, unspecified: G03.9

## 2018-02-09 LAB — COMPREHENSIVE METABOLIC PANEL
ALK PHOS: 57 U/L (ref 38–126)
ALT: 13 U/L (ref 0–44)
AST: 15 U/L (ref 15–41)
Albumin: 3.1 g/dL — ABNORMAL LOW (ref 3.5–5.0)
Anion gap: 8 (ref 5–15)
BILIRUBIN TOTAL: 0.4 mg/dL (ref 0.3–1.2)
BUN: 7 mg/dL (ref 6–20)
CALCIUM: 9 mg/dL (ref 8.9–10.3)
CO2: 23 mmol/L (ref 22–32)
CREATININE: 0.48 mg/dL (ref 0.44–1.00)
Chloride: 107 mmol/L (ref 98–111)
Glucose, Bld: 105 mg/dL — ABNORMAL HIGH (ref 70–99)
Potassium: 4.4 mmol/L (ref 3.5–5.1)
Sodium: 138 mmol/L (ref 135–145)
TOTAL PROTEIN: 6.5 g/dL (ref 6.5–8.1)

## 2018-02-09 LAB — CBC
HEMATOCRIT: 36.2 % (ref 36.0–46.0)
HEMOGLOBIN: 12.1 g/dL (ref 12.0–15.0)
MCH: 31 pg (ref 26.0–34.0)
MCHC: 33.4 g/dL (ref 30.0–36.0)
MCV: 92.8 fL (ref 80.0–100.0)
Platelets: 217 10*3/uL (ref 150–400)
RBC: 3.9 MIL/uL (ref 3.87–5.11)
RDW: 13 % (ref 11.5–15.5)
WBC: 7.3 10*3/uL (ref 4.0–10.5)
nRBC: 0 % (ref 0.0–0.2)

## 2018-02-09 LAB — PROTEIN / CREATININE RATIO, URINE
CREATININE, URINE: 35 mg/dL
Total Protein, Urine: <6 mg/dL

## 2018-02-09 LAB — URINALYSIS, ROUTINE W REFLEX MICROSCOPIC
Bilirubin Urine: NEGATIVE
GLUCOSE, UA: NEGATIVE mg/dL
HGB URINE DIPSTICK: NEGATIVE
Ketones, ur: NEGATIVE mg/dL
Leukocytes, UA: NEGATIVE
Nitrite: NEGATIVE
PH: 7 (ref 5.0–8.0)
Protein, ur: NEGATIVE mg/dL
Specific Gravity, Urine: 1.009 (ref 1.005–1.030)

## 2018-02-09 NOTE — MAU Provider Note (Addendum)
History     CSN: [id number]  Arrival date and time: 02/09/18 4098   First Provider Initiated Contact with Patient 02/09/18 1851      Chief Complaint  Patient presents with  . Dizziness  . Fatigue  . Floaters in vision   [id number] @31 .1 wks here with lightheadedness and seeing black floaters. Sx started today. Reports waking with HA which resolved spontaneously then these sx developed. She is eating and drinking well. Denies fevers or cold sx. No RUQ pain, SOB, or CP. Good FM. 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 She had these sx then. She admits to missing 2 nights if her antidepressant.   OB History    Gravida  6   Para  2   Term  2   Preterm      AB  3   Living  2     SAB  3   TAB      Ectopic      Multiple  0   Live Births  2           Past Medical History:  Diagnosis Date  . Anxiety   . Depression    since 12/18 from [facility]  . PTSD (post-traumatic stress disorder) 2019    Past Surgical History:  Procedure Laterality Date  . APPENDECTOMY    . CESAREAN SECTION N/A 12/30/2015   Procedure: CESAREAN SECTION;  Surgeon: Adam Phenix, MD;  Location: [facility] BIRTHING SUITES;  Service: Obstetrics;  Laterality: N/A;  . CHOLECYSTECTOMY      Family History  Problem Relation Age of Onset  . Cancer Maternal Aunt   . Cancer Maternal Uncle   . Diabetes Paternal Aunt   . Diabetes Paternal Uncle   . Diabetes Maternal Grandmother   . Cancer Maternal Grandfather   . Cancer Paternal Grandmother   . Diabetes Paternal Grandmother   . Diabetes Paternal Grandfather     Social History   Tobacco Use  . Smoking status: Former Games developer  . Smokeless tobacco: Former Engineer, water Use Topics  . Alcohol use: No  . Drug use: No    Allergies:  Allergies  Allergen Reactions  . Divalproex Sodium Other (See Comments)    Reaction:  Memory loss   . Demerol [Meperidine] Hives     Medications Prior to Admission  Medication Sig Dispense Refill Last Dose  . aspirin 81 MG chewable tablet Chew 1 tablet (81 mg total) by mouth daily. 60 tablet 3 02/08/2018 at Unknown time  . citalopram (CELEXA) 20 MG tablet Take 1 tablet (20 mg total) by mouth daily. 30 tablet 3 02/08/2018 at Unknown time  . Elastic Bandages & Supports (COMFORT FIT MATERNITY SUPP MED) MISC 1 Device by Does not apply route daily. 1 each 0 02/08/2018 at Unknown time  . Prenatal Vit-Fe Fumarate-FA (MULTIVITAMIN-PRENATAL) 27-0.8 MG TABS tablet Take 1 tablet by mouth daily at 12 noon.   02/08/2018 at Unknown time  . cyclobenzaprine (FLEXERIL) 10 MG tablet Take 1 tablet (10 mg total) by mouth every 8 (eight) hours as needed for muscle spasms. 30 tablet 0 More than a month at Unknown time    Review of Systems  Constitutional: Negative for chills and fever.  Respiratory: Negative for shortness of breath.   Cardiovascular: Negative for chest pain.  Gastrointestinal: Negative for abdominal pain.  Genitourinary: Negative for vaginal bleeding.  Neurological: Positive for light-headedness.  Negative for dizziness, syncope, weakness and headaches.   Physical Exam   Blood pressure 124/81, pulse 93, temperature 98.1 F (36.7 C), temperature source Oral, resp. rate 18, height 5\' 7"  (1.702 m), weight 93.1 kg, last menstrual period 12/19/2016, unknown if currently breastfeeding.  Physical Exam  Nursing note and vitals reviewed. Constitutional: She is oriented to person, place, and time. She appears well-developed and well-nourished. No distress.  HENT:  Head: Normocephalic and atraumatic.  Eyes: Pupils are equal, round, and reactive to light. Conjunctivae and EOM are normal. Lids are everted and swept, no foreign bodies found.  Neck: Normal range of motion.  Cardiovascular: Normal rate, regular rhythm and normal heart sounds.  Respiratory: Effort normal and breath sounds normal. No respiratory distress. She has no  wheezes. She has no rales.  GI: Soft. She exhibits no distension. There is no tenderness.  gravid  Musculoskeletal: Normal range of motion. She exhibits no edema.  Neurological: She is alert and oriented to person, place, and time. She has normal reflexes. No cranial nerve deficit.  Skin: Skin is warm and dry.  Psychiatric: She has a normal mood and affect.  EFM: 125 bpm, mod variability, + accels, no decels Toco: irritability  Results for orders placed or performed during the hospital encounter of 02/09/18 (from the past 24 hour(s))  Urinalysis, Routine w reflex microscopic     Status: None   Collection Time: 02/09/18  6:25 PM  Result Value Ref Range   Color, Urine YELLOW YELLOW   APPearance CLEAR CLEAR   Specific Gravity, Urine 1.009 1.005 - 1.030   pH 7.0 5.0 - 8.0   Glucose, UA NEGATIVE NEGATIVE mg/dL   Hgb urine dipstick NEGATIVE NEGATIVE   Bilirubin Urine NEGATIVE NEGATIVE   Ketones, ur NEGATIVE NEGATIVE mg/dL   Protein, ur NEGATIVE NEGATIVE mg/dL   Nitrite NEGATIVE NEGATIVE   Leukocytes, UA NEGATIVE NEGATIVE  Protein / creatinine ratio, urine     Status: None   Collection Time: 02/09/18  6:25 PM  Result Value Ref Range   Creatinine, Urine 35.00 mg/dL   Total Protein, Urine <6 mg/dL   Protein Creatinine Ratio        0.00 - 0.15 mg/mg[Cre]  CBC     Status: None   Collection Time: 02/09/18  7:12 PM  Result Value Ref Range   WBC 7.3 4.0 - 10.5 K/uL   RBC 3.90 3.87 - 5.11 MIL/uL   Hemoglobin 12.1 12.0 - 15.0 g/dL   HCT 16.1 09.6 - 04.5 %   MCV 92.8 80.0 - 100.0 fL   MCH 31.0 26.0 - 34.0 pg   MCHC 33.4 30.0 - 36.0 g/dL   RDW 40.9 81.1 - 91.4 %   Platelets 217 150 - 400 K/uL   nRBC 0.0 0.0 - 0.2 %  Comprehensive metabolic panel     Status: Abnormal   Collection Time: 02/09/18  7:12 PM  Result Value Ref Range   Sodium 138 135 - 145 mmol/L   Potassium 4.4 3.5 - 5.1 mmol/L   Chloride 107 98 - 111 mmol/L   CO2 23 22 - 32 mmol/L   Glucose, Bld 105 (H) 70 - 99 mg/dL    BUN 7 6 - 20 mg/dL   Creatinine, Ser 7.82 0.44 - 1.00 mg/dL   Calcium 9.0 8.9 - 95.6 mg/dL   Total Protein 6.5 6.5 - 8.1 g/dL   Albumin 3.1 (L) 3.5 - 5.0 g/dL   AST 15 15 - 41 U/L   ALT 13 0 -  44 U/L   Alkaline Phosphatase 57 38 - 126 U/L   Total Bilirubin 0.4 0.3 - 1.2 mg/dL   GFR calc non Af Amer >60 >60 mL/min   GFR calc Af Amer >60 >60 mL/min   Anion gap 8 5 - 15   MAU Course  Procedures  MDM Labs ordered and reviewed. Not orthostatic. Labs normal. No PEC. Normal neuro exam. Pt feeling better now. Will obtain previous records from Neuro and arrange outpt consult. Stable for discharge home.    Assessment and Plan   1. [redacted] weeks gestation of pregnancy   2. NST (non-stress test) reactive   3. Visual disturbance   4. Lightheadedness    Discharge home Follow up in OB office as scheduled Return for worseing sx  Allergies as of 02/09/2018      Reactions   Divalproex Sodium Other (See Comments)   Reaction:  Memory loss    Demerol [meperidine] Hives      Medication List    TAKE these medications   aspirin 81 MG chewable tablet Chew 1 tablet (81 mg total) by mouth daily.   citalopram 20 MG tablet Commonly known as:  CELEXA Take 1 tablet (20 mg total) by mouth daily.   COMFORT FIT MATERNITY SUPP MED Misc 1 Device by Does not apply route daily.   cyclobenzaprine 10 MG tablet Commonly known as:  FLEXERIL Take 1 tablet (10 mg total) by mouth every 8 (eight) hours as needed for muscle spasms.   multivitamin-prenatal 27-0.8 MG Tabs tablet Take 1 tablet by mouth daily at 12 noon.      Donette LarryMelanie Satya Bohall, CNM 02/09/2018, 8:28 PM

## 2018-02-09 NOTE — MAU Note (Signed)
Woke up with a headache this AM, did not take any meds. States the headache went away but now is feeling dizzy, weak, and see floaters. States she has been eating well and drinking lots of water today but is still feeling bad.

## 2018-02-09 NOTE — Discharge Instructions (Signed)
Near-Syncope °Near-syncope is when you suddenly get weak or dizzy, or you feel like you might pass out (faint). During an episode of near-syncope, you may: °· Feel dizzy or light-headed. °· Feel sick to your stomach (nauseous). °· See all white or all black. °· Have cold, clammy skin. ° °If you passed out, get help right away.Call your local emergency services (911 in the [place] Do not drive yourself to the hospital. °Follow these instructions at home: °Pay attention to any changes in your symptoms. Take these actions to help with your condition: °· Have someone stay with you until you feel stable. °· Do not drive, use machinery, or play sports until your doctor says it is okay. °· Keep all follow-up visits as told by your doctor. This is important. °· If you start to feel like you might pass out, lie down right away and raise (elevate) your feet above the level of your heart. Breathe deeply and steadily. Wait until all of the symptoms are gone. °· Drink enough fluid to keep your pee (urine) clear or pale yellow. °· If you are taking blood pressure or heart medicine, get up slowly and spend many minutes getting ready to sit and then stand. This can help with dizziness. °· Take over-the-counter and prescription medicines only as told by your doctor. ° °Get help right away if: °· You have a very bad headache. °· You have unusual pain in your chest, tummy, or back. °· You are bleeding from your mouth or rectum. °· You have black or tarry poop (stool). °· You have a very fast or uneven heartbeat (palpitations). °· You pass out one time or more than once. °· You have jerky movements that you cannot control (seizure). °· You are confused. °· You have trouble walking. °· You are very weak. °· You have vision problems. °These symptoms may be an emergency. Do not wait to see if the symptoms will go away. Get medical help right away. Call your local emergency services (911 in the [place] Do not drive yourself to the  hospital. °This information is not intended to replace advice given to you by your health care provider. Make sure you discuss any questions you have with your health care provider. °Document Released: 08/22/2007 Document Revised: 08/11/2015 Document Reviewed: 11/17/2014 °Elsevier Interactive Patient Education © 2017 Elsevier Inc. ° °

## 2018-02-10 ENCOUNTER — Telehealth: Payer: Self-pay | Admitting: *Deleted

## 2018-02-10 NOTE — Telephone Encounter (Signed)
Left patient a message with [facility] per [employer]anie Bhambri.

## 2018-02-18 ENCOUNTER — Ambulatory Visit (INDEPENDENT_AMBULATORY_CARE_PROVIDER_SITE_OTHER): Payer: Medicaid Other | Admitting: Advanced Practice Midwife

## 2018-02-18 ENCOUNTER — Encounter: Payer: Self-pay | Admitting: Advanced Practice Midwife

## 2018-02-18 VITALS — BP 117/65 | HR 81 | Wt 208.0 lb

## 2018-02-18 DIAGNOSIS — O34219 Maternal care for unspecified type scar from previous cesarean delivery: Secondary | ICD-10-CM

## 2018-02-18 DIAGNOSIS — Z3480 Encounter for supervision of other normal pregnancy, unspecified trimester: Secondary | ICD-10-CM

## 2018-02-18 DIAGNOSIS — O26893 Other specified pregnancy related conditions, third trimester: Secondary | ICD-10-CM

## 2018-02-18 DIAGNOSIS — O26843 Uterine size-date discrepancy, third trimester: Secondary | ICD-10-CM

## 2018-02-18 DIAGNOSIS — R51 Headache: Secondary | ICD-10-CM

## 2018-02-18 DIAGNOSIS — Z8659 Personal history of other mental and behavioral disorders: Secondary | ICD-10-CM

## 2018-02-18 DIAGNOSIS — Z3483 Encounter for supervision of other normal pregnancy, third trimester: Secondary | ICD-10-CM

## 2018-02-18 DIAGNOSIS — O9989 Other specified diseases and conditions complicating pregnancy, childbirth and the puerperium: Secondary | ICD-10-CM

## 2018-02-18 MED ORDER — BUTALBITAL-APAP-CAFFEINE 50-325-40 MG PO TABS: 1.0000 | ORAL_TABLET | Freq: Four times a day (QID) | ORAL | 0 refills | Status: DC | PRN

## 2018-02-18 NOTE — Progress Notes (Signed)
f  PRENATAL VISIT NOTE  Subjective:  Laura [name] is a 22 y.o. [phone] at 8029w3d being seen today for ongoing prenatal care.  She is currently monitored for the following issues for this low-risk pregnancy and has Supervision of other normal pregnancy, antepartum; MDD (major depressive disorder), single episode, severe , no psychosis (HCC); Recurrent pregnancy loss; History of postpartum depression, currently pregnant; and History of cesarean delivery, antepartum on their problem list.  Patient reports headache and worsening depression.  Contractions: Irritability. Vag. Bleeding: None.  Movement: Present. Denies leaking of fluid.   The following portions of the patient's history were reviewed and updated as appropriate: allergies, current medications, past family history, past medical history, past social history, past surgical history and problem list. Problem list updated.  Objective:   Vitals:   02/18/18 0935  BP: 117/65  Pulse: 81  Weight: 94.3 kg    Fetal Status: Fetal Heart Rate (bpm): 143   Movement: Present     General:  Alert, oriented and cooperative. Patient is in no acute distress.  Skin: Skin is warm and dry. No rash noted.   Cardiovascular: Normal heart rate noted  Respiratory: Normal respiratory effort, no problems with respiration noted  Abdomen: Soft, gravid, appropriate for gestational age.  Pain/Pressure: Present     Pelvic: Cervical exam deferred        Extremities: Normal range of motion.  Edema: Trace  Mental Status: Normal mood and affect. Normal behavior. Normal judgment and thought content.   Assessment and Plan:  Pregnancy: [id number] at 2829w3d  1. Supervision of other normal pregnancy, antepartum --Anticipatory guidance about next visits/weeks of pregnancy given.  2. History of postpartum depression, currently pregnant --Pt reports that daily h/a are affecting her, making her depression worse. She is worried about PPD since this was so bad last  time. --Will treat h/a (see below). F/u in 1 week to see if improved.  3. History of cesarean delivery, antepartum --Vag delivery then C/S x 1 with last pregnancy. Plans TOLAC.  4. Uterine size date discrepancy pregnancy, third trimester --FH 36 today, hx of 9 lb baby with C/S.  No GDM.  Will get EFW by US. - US MFM OB FOLLOW UP; Future  5. Headache in pregnancy, antepartum, third trimester --Persistent, daily, not resolved with Tylenol.  Hx of migraines but this has no other associated symptoms.  Did not make appt with Clydie BraunKaren since first available is after her due date. --Encouraged pt to make f/u with Clydie BraunKaren, and call to see if there are cancellations. - butalbital-acetaminophen-caffeine (FIORICET, ESGIC) 50-325-40 MG tablet; Take 1-2 tablets by mouth every 6 (six) hours as needed for headache.  Dispense: 20 tablet; Refill: 0   Preterm labor symptoms and general obstetric precautions including but not limited to vaginal bleeding, contractions, leaking of fluid and fetal movement were reviewed in detail with the patient. Please refer to After Visit Summary for other counseling recommendations.  Return in about 2 weeks (around 03/04/2018).  No future appointments.  Sharen CounterLisa Leftwich-Kirby, CNM

## 2018-02-18 NOTE — Patient Instructions (Signed)
Third Trimester of Pregnancy The third trimester is from week 28 through week 40 (months 7 through 9). The third trimester is a time when the unborn baby (fetus) is growing rapidly. At the end of the ninth month, the fetus is about 20 inches in length and weighs 6-10 pounds. Body changes during your third trimester Your body will continue to go through many changes during pregnancy. The changes vary from woman to woman. During the third trimester:  Your weight will continue to increase. You can expect to gain 25-35 pounds (11-16 kg) by the end of the pregnancy.  You may begin to get stretch marks on your hips, abdomen, and breasts.  You may urinate more often because the fetus is moving lower into your pelvis and pressing on your bladder.  You may develop or continue to have heartburn. This is caused by increased hormones that slow down muscles in the digestive tract.  You may develop or continue to have constipation because increased hormones slow digestion and cause the muscles that push waste through your intestines to relax.  You may develop hemorrhoids. These are swollen veins (varicose veins) in the rectum that can itch or be painful.  You may develop swollen, bulging veins (varicose veins) in your legs.  You may have increased body aches in the pelvis, back, or thighs. This is due to weight gain and increased hormones that are relaxing your joints.  You may have changes in your hair. These can include thickening of your hair, rapid growth, and changes in texture. Some women also have hair loss during or after pregnancy, or hair that feels dry or thin. Your hair will most likely return to normal after your baby is born.  Your breasts will continue to grow and they will continue to become tender. A yellow fluid (colostrum) may leak from your breasts. This is the first milk you are producing for your baby.  Your belly button may stick out.  You may notice more swelling in your hands,  face, or ankles.  You may have increased tingling or numbness in your hands, arms, and legs. The skin on your belly may also feel numb.  You may feel short of breath because of your expanding uterus.  You may have more problems sleeping. This can be caused by the size of your belly, increased need to urinate, and an increase in your body's metabolism.  You may notice the fetus "dropping," or moving lower in your abdomen (lightening).  You may have increased vaginal discharge.  You may notice your joints feel loose and you may have pain around your pelvic bone.  What to expect at prenatal visits You will have prenatal exams every 2 weeks until week 36. Then you will have weekly prenatal exams. During a routine prenatal visit:  You will be weighed to make sure you and the baby are growing normally.  Your blood pressure will be taken.  Your abdomen will be measured to track your baby's growth.  The fetal heartbeat will be listened to.  Any test results from the previous visit will be discussed.  You may have a cervical check near your due date to see if your cervix has softened or thinned (effaced).  You will be tested for Group B streptococcus. This happens between 35 and 37 weeks.  Your health care provider may ask you:  What your birth plan is.  How you are feeling.  If you are feeling the baby move.  If you have had   any abnormal symptoms, such as leaking fluid, bleeding, severe headaches, or abdominal cramping.  If you are using any tobacco products, including cigarettes, chewing tobacco, and electronic cigarettes.  If you have any questions.  Other tests or screenings that may be performed during your third trimester include:  Blood tests that check for low iron levels (anemia).  Fetal testing to check the health, activity level, and growth of the fetus. Testing is done if you have certain medical conditions or if there are problems during the  pregnancy.  Nonstress test (NST). This test checks the health of your baby to make sure there are no signs of problems, such as the baby not getting enough oxygen. During this test, a belt is placed around your belly. The baby is made to move, and its heart rate is monitored during movement.  What is false labor? False labor is a condition in which you feel small, irregular tightenings of the muscles in the womb (contractions) that usually go away with rest, changing position, or drinking water. These are called Braxton Hicks contractions. Contractions may last for hours, days, or even weeks before true labor sets in. If contractions come at regular intervals, become more frequent, increase in intensity, or become painful, you should see your health care provider. What are the signs of labor?  Abdominal cramps.  Regular contractions that start at 10 minutes apart and become stronger and more frequent with time.  Contractions that start on the top of the uterus and spread down to the lower abdomen and back.  Increased pelvic pressure and dull back pain.  A watery or bloody mucus discharge that comes from the vagina.  Leaking of amniotic fluid. This is also known as your "water breaking." It could be a slow trickle or a gush. Let your health care provider know if it has a color or strange odor. If you have any of these signs, call your health care provider right away, even if it is before your due date. Follow these instructions at home: Medicines  Follow your health care provider's instructions regarding medicine use. Specific medicines may be either safe or unsafe to take during pregnancy.  Take a prenatal vitamin that contains at least 600 micrograms (mcg) of folic acid.  If you develop constipation, try taking a stool softener if your health care provider approves. Eating and drinking  Eat a balanced diet that includes fresh fruits and vegetables, whole grains, good sources of protein  such as meat, eggs, or tofu, and low-fat dairy. Your health care provider will help you determine the amount of weight gain that is right for you.  Avoid raw meat and uncooked cheese. These carry germs that can cause birth defects in the baby.  If you have low calcium intake from food, talk to your health care provider about whether you should take a daily calcium supplement.  Eat four or five small meals rather than three large meals a day.  Limit foods that are high in fat and processed sugars, such as fried and sweet foods.  To prevent constipation: ? Drink enough fluid to keep your urine clear or pale yellow. ? Eat foods that are high in fiber, such as fresh fruits and vegetables, whole grains, and beans. Activity  Exercise only as directed by your health care provider. Most women can continue their usual exercise routine during pregnancy. Try to exercise for 30 minutes at least 5 days a week. Stop exercising if you experience uterine contractions.  Avoid heavy   lifting.  Do not exercise in extreme heat or humidity, or at high altitudes.  Wear low-heel, comfortable shoes.  Practice good posture.  You may continue to have sex unless your health care provider tells you otherwise. Relieving pain and discomfort  Take frequent breaks and rest with your legs elevated if you have leg cramps or low back pain.  Take warm sitz baths to soothe any pain or discomfort caused by hemorrhoids. Use hemorrhoid cream if your health care provider approves.  Wear a good support bra to prevent discomfort from breast tenderness.  If you develop varicose veins: ? Wear support pantyhose or compression stockings as told by your healthcare provider. ? Elevate your feet for 15 minutes, 3-4 times a day. Prenatal care  Write down your questions. Take them to your prenatal visits.  Keep all your prenatal visits as told by your health care provider. This is important. Safety  Wear your seat belt at  all times when driving.  Make a list of emergency phone numbers, including numbers for family, friends, the hospital, and police and fire departments. General instructions  Avoid cat litter boxes and soil used by cats. These carry germs that can cause birth defects in the baby. If you have a cat, ask someone to clean the litter box for you.  Do not travel far distances unless it is absolutely necessary and only with the approval of your health care provider.  Do not use hot tubs, steam rooms, or saunas.  Do not drink alcohol.  Do not use any products that contain nicotine or tobacco, such as cigarettes and e-cigarettes. If you need help quitting, ask your health care provider.  Do not use any medicinal herbs or unprescribed drugs. These chemicals affect the formation and growth of the baby.  Do not douche or use tampons or scented sanitary pads.  Do not cross your legs for long periods of time.  To prepare for the arrival of your baby: ? Take prenatal classes to understand, practice, and ask questions about labor and delivery. ? Make a trial run to the hospital. ? Visit the hospital and tour the maternity area. ? Arrange for maternity or paternity leave through employers. ? Arrange for family and friends to take care of pets while you are in the hospital. ? Purchase a rear-facing car seat and make sure you know how to install it in your car. ? Pack your hospital bag. ? Prepare the baby's nursery. Make sure to remove all pillows and stuffed animals from the baby's crib to prevent suffocation.  Visit your dentist if you have not gone during your pregnancy. Use a soft toothbrush to brush your teeth and be gentle when you floss. Contact a health care provider if:  You are unsure if you are in labor or if your water has broken.  You become dizzy.  You have mild pelvic cramps, pelvic pressure, or nagging pain in your abdominal area.  You have lower back pain.  You have persistent  nausea, vomiting, or diarrhea.  You have an unusual or bad smelling vaginal discharge.  You have pain when you urinate. Get help right away if:  Your water breaks before 37 weeks.  You have regular contractions less than 5 minutes apart before 37 weeks.  You have a fever.  You are leaking fluid from your vagina.  You have spotting or bleeding from your vagina.  You have severe abdominal pain or cramping.  You have rapid weight loss or weight gain.    You have shortness of breath with chest pain.  You notice sudden or extreme swelling of your face, hands, ankles, feet, or legs.  Your baby makes fewer than 10 movements in 2 hours.  You have severe headaches that do not go away when you take medicine.  You have vision changes. Summary  The third trimester is from week 28 through week 40, months 7 through 9. The third trimester is a time when the unborn baby (fetus) is growing rapidly.  During the third trimester, your discomfort may increase as you and your baby continue to gain weight. You may have abdominal, leg, and back pain, sleeping problems, and an increased need to urinate.  During the third trimester your breasts will keep growing and they will continue to become tender. A yellow fluid (colostrum) may leak from your breasts. This is the first milk you are producing for your baby.  False labor is a condition in which you feel small, irregular tightenings of the muscles in the womb (contractions) that eventually go away. These are called Braxton Hicks contractions. Contractions may last for hours, days, or even weeks before true labor sets in.  Signs of labor can include: abdominal cramps; regular contractions that start at 10 minutes apart and become stronger and more frequent with time; watery or bloody mucus discharge that comes from the vagina; increased pelvic pressure and dull back pain; and leaking of amniotic fluid. This information is not intended to replace advice  given to you by your health care provider. Make sure you discuss any questions you have with your health care provider. Document Released: 02/27/2001 Document Revised: 08/11/2015 Document Reviewed: 05/06/2012 Elsevier Interactive Patient Education  2017 Else[employer] Inc.  

## 2018-02-19 ENCOUNTER — Telehealth: Payer: Self-pay | Admitting: *Deleted

## 2018-02-19 NOTE — Telephone Encounter (Signed)
Left patient a message to call the office if she wants a 9:30am appointment on Friday, 02/21/18 [facility]. Rhea BleacherJacinda will have to be called to schedule.

## 2018-02-24 ENCOUNTER — Encounter: Payer: Self-pay | Admitting: Advanced Practice Midwife

## 2018-02-24 ENCOUNTER — Ambulatory Visit (HOSPITAL_COMMUNITY)
Admission: RE | Admit: 2018-02-24 | Discharge: 2018-02-24 | Disposition: A | Discharge status: Home / Self Care | Payer: Medicaid Other | Source: Ambulatory Visit | Attending: Advanced Practice Midwife | Admitting: Advanced Practice Midwife

## 2018-02-24 DIAGNOSIS — O34219 Maternal care for unspecified type scar from previous cesarean delivery: Secondary | ICD-10-CM | POA: Diagnosis not present

## 2018-02-24 DIAGNOSIS — O26843 Uterine size-date discrepancy, third trimester: Secondary | ICD-10-CM | POA: Insufficient documentation

## 2018-02-24 DIAGNOSIS — O3660X0 Maternal care for excessive fetal growth, unspecified trimester, not applicable or unspecified: Secondary | ICD-10-CM | POA: Insufficient documentation

## 2018-02-24 DIAGNOSIS — Z3A33 33 weeks gestation of pregnancy: Secondary | ICD-10-CM

## 2018-02-25 ENCOUNTER — Encounter: Payer: Self-pay | Admitting: Neurology

## 2018-02-25 ENCOUNTER — Ambulatory Visit (INDEPENDENT_AMBULATORY_CARE_PROVIDER_SITE_OTHER): Payer: Medicaid Other | Admitting: Advanced Practice Midwife

## 2018-02-25 VITALS — BP 102/64 | HR 78 | Wt 211.0 lb

## 2018-02-25 DIAGNOSIS — R51 Headache: Secondary | ICD-10-CM

## 2018-02-25 DIAGNOSIS — O26893 Other specified pregnancy related conditions, third trimester: Secondary | ICD-10-CM

## 2018-02-25 DIAGNOSIS — O26843 Uterine size-date discrepancy, third trimester: Secondary | ICD-10-CM

## 2018-02-25 NOTE — Progress Notes (Signed)
F/U headaches

## 2018-02-25 NOTE — Patient Instructions (Signed)
Migraine Headache A migraine headache is an intense, throbbing pain on one side or both sides of the head. Migraines may also cause other symptoms, such as nausea, vomiting, and sensitivity to light and noise. What are the causes? Doing or taking certain things may also trigger migraines, such as:  Alcohol.  Smoking.  Medicines, such as: ? Medicine used to treat chest pain (nitroglycerine). ? Birth control pills. ? Estrogen pills. ? Certain blood pressure medicines.  Aged cheeses, chocolate, or caffeine.  Foods or drinks that contain nitrates, glutamate, aspartame, or tyramine.  Physical activity.  Other things that may trigger a migraine include:  Menstruation.  Pregnancy.  Hunger.  Stress, lack of sleep, too much sleep, or fatigue.  Weather changes.  What increases the risk? The following factors may make you more likely to experience migraine headaches:  Age. Risk increases with age.  Family history of migraine headaches.  Being Caucasian.  Depression and anxiety.  Obesity.  Being a woman.  Having a hole in the heart (patent foramen ovale) or other heart problems.  What are the signs or symptoms? The main symptom of this condition is pulsating or throbbing pain. Pain may:  Happen in any area of the head, such as on one side or both sides.  Interfere with daily activities.  Get worse with physical activity.  Get worse with exposure to bright lights or loud noises.  Other symptoms may include:  Nausea.  Vomiting.  Dizziness.  General sensitivity to bright lights, loud noises, or smells.  Before you get a migraine, you may get warning signs that a migraine is developing (aura). An aura may include:  Seeing flashing lights or having blind spots.  Seeing bright spots, halos, or zigzag lines.  Having tunnel vision or blurred vision.  Having numbness or a tingling feeling.  Having trouble talking.  Having muscle weakness.  How is this  diagnosed? A migraine headache can be diagnosed based on:  Your symptoms.  A physical exam.  Tests, such as CT scan or MRI of the head. These imaging tests can help rule out other causes of headaches.  Taking fluid from the spine (lumbar puncture) and analyzing it (cerebrospinal fluid analysis, or CSF analysis).  How is this treated? A migraine headache is usually treated with medicines that:  Relieve pain.  Relieve nausea.  Prevent migraines from coming back.  Treatment may also include:  Acupuncture.  Lifestyle changes like avoiding foods that trigger migraines.  Follow these instructions at home: Medicines  Take over-the-counter and prescription medicines only as told by your health care provider.  Do not drive or use heavy machinery while taking prescription pain medicine.  To prevent or treat constipation while you are taking prescription pain medicine, your health care provider may recommend that you: ? Drink enough fluid to keep your urine clear or pale yellow. ? Take over-the-counter or prescription medicines. ? Eat foods that are high in fiber, such as fresh fruits and vegetables, whole grains, and beans. ? Limit foods that are high in fat and processed sugars, such as fried and sweet foods. Lifestyle  Avoid alcohol use.  Do not use any products that contain nicotine or tobacco, such as cigarettes and e-cigarettes. If you need help quitting, ask your health care provider.  Get at least 8 hours of sleep every night.  Limit your stress. General instructions   Keep a journal to find out what may trigger your migraine headaches. For example, write down: ? What you eat and   drink. ? How much sleep you get. ? Any change to your diet or medicines.  If you have a migraine: ? Avoid things that make your symptoms worse, such as bright lights. ? It may help to lie down in a dark, quiet room. ? Do not drive or use heavy machinery. ? Ask your health care provider  what activities are safe for you while you are experiencing symptoms.  Keep all follow-up visits as told by your health care provider. This is important. Contact a health care provider if:  You develop symptoms that are different or more severe than your usual migraine symptoms. Get help right away if:  Your migraine becomes severe.  You have a fever.  You have a stiff neck.  You have vision loss.  Your muscles feel weak or like you cannot control them.  You start to lose your balance often.  You develop trouble walking.  You faint. This information is not intended to replace advice given to you by your health care provider. Make sure you discuss any questions you have with your health care provider. Document Released: 03/05/2005 Document Revised: 09/23/2015 Document Reviewed: 08/22/2015 Elsevier Interactive Patient Education  2017 Elsevier Inc.   

## 2018-02-25 NOTE — Progress Notes (Signed)
   PRENATAL VISIT NOTE  Subjective:  Laura [name] is a 22 y.o. [phone] at 6479w3d being seen today for ongoing prenatal care.  She is currently monitored for the following issues for this low-risk pregnancy and has Supervision of other normal pregnancy, antepartum; MDD (major depressive disorder), single episode, severe , no psychosis (HCC); Recurrent pregnancy loss; History of postpartum depression, currently pregnant; History of cesarean delivery, antepartum; and LGA (large for gestational age) fetus affecting management of mother on their problem list.  Patient reports headache.  Contractions: Irritability. Vag. Bleeding: None.  Movement: Present. Denies leaking of fluid.   The following portions of the patient's history were reviewed and updated as appropriate: allergies, current medications, past family history, past medical history, past social history, past surgical history and problem list. Problem list updated.  Objective:   Vitals:   02/25/18 1523  BP: 102/64  Pulse: 78  Weight: 95.7 kg    Fetal Status: Fetal Heart Rate (bpm): 131   Movement: Present     General:  Alert, oriented and cooperative. Patient is in no acute distress.  Skin: Skin is warm and dry. No rash noted.   Cardiovascular: Normal heart rate noted  Respiratory: Normal respiratory effort, no problems with respiration noted  Abdomen: Soft, gravid, appropriate for gestational age.  Pain/Pressure: Present     Pelvic: Cervical exam deferred        Extremities: Normal range of motion.  Edema: None  Mental Status: Normal mood and affect. Normal behavior. Normal judgment and thought content.   Assessment and Plan:  Pregnancy: [id number] at 479w3d  1. Headache in pregnancy, antepartum, third trimester --Fioricet does help with h/a but does not resolve.  Pt only taking one tablet.   --Take 2 tablets at onset of headache. -Continue increased PO fluids --With pt hx severe migraines and her concerns that this could  worsen, will refer to headache clinic.  Next avail appt is after pt due date so referral to neurology as well. Pt has not seen neuro in several years. - Ambulatory referral to Neurology - AMB referral to headache clinic  2. Uterine size date discrepancy pregnancy, third trimester --Pt still planning TOLAC, with hx one vaginal then one C/S for breech but recent EFW was >90%tile so she has concerns. Vaginal delivery with 8lb2oz baby, then C/S baby was 9+.  Discussed accuracy/inaccuracy of third trimester US. Pt aware and would like a term US to assist in decision making about VBAC vs RLTCS. - US MFM OB FOLLOW UP; Future  Preterm labor symptoms and general obstetric precautions including but not limited to vaginal bleeding, contractions, leaking of fluid and fetal movement were reviewed in detail with the patient. Please refer to After Visit Summary for other counseling recommendations.  No follow-ups on file.  No future appointments.  Sharen CounterLisa Leftwich-Kirby, CNM

## 2018-03-06 ENCOUNTER — Encounter (HOSPITAL_COMMUNITY): Payer: Self-pay | Admitting: *Deleted

## 2018-03-06 ENCOUNTER — Inpatient Hospital Stay (HOSPITAL_COMMUNITY)
Admission: AD | Admit: 2018-03-06 | Discharge: 2018-03-06 | Disposition: A | Discharge status: Home / Self Care | Payer: Medicaid Other | Attending: Obstetrics and Gynecology | Admitting: Obstetrics and Gynecology

## 2018-03-06 DIAGNOSIS — O9989 Other specified diseases and conditions complicating pregnancy, childbirth and the puerperium: Secondary | ICD-10-CM

## 2018-03-06 DIAGNOSIS — Z7982 Long term (current) use of aspirin: Secondary | ICD-10-CM | POA: Diagnosis not present

## 2018-03-06 DIAGNOSIS — Z8659 Personal history of other mental and behavioral disorders: Secondary | ICD-10-CM | POA: Diagnosis not present

## 2018-03-06 DIAGNOSIS — O34219 Maternal care for unspecified type scar from previous cesarean delivery: Secondary | ICD-10-CM

## 2018-03-06 DIAGNOSIS — O4703 False labor before 37 completed weeks of gestation, third trimester: Secondary | ICD-10-CM

## 2018-03-06 DIAGNOSIS — Z3A34 34 weeks gestation of pregnancy: Secondary | ICD-10-CM | POA: Diagnosis not present

## 2018-03-06 DIAGNOSIS — Z87891 Personal history of nicotine dependence: Secondary | ICD-10-CM | POA: Diagnosis not present

## 2018-03-06 DIAGNOSIS — Z3689 Encounter for other specified antenatal screening: Secondary | ICD-10-CM

## 2018-03-06 LAB — URINALYSIS, ROUTINE W REFLEX MICROSCOPIC
Bilirubin Urine: NEGATIVE
GLUCOSE, UA: NEGATIVE mg/dL
HGB URINE DIPSTICK: NEGATIVE
Ketones, ur: NEGATIVE mg/dL
Leukocytes, UA: NEGATIVE
Nitrite: NEGATIVE
Protein, ur: NEGATIVE mg/dL
Specific Gravity, Urine: 1.005 (ref 1.005–1.030)
pH: 7 (ref 5.0–8.0)

## 2018-03-06 LAB — POCT FERN TEST: POCT Fern Test: NEGATIVE

## 2018-03-06 MED ORDER — NIFEDIPINE 10 MG PO CAPS
10.0000 mg | ORAL_CAPSULE | ORAL | Status: AC
  Administered 2018-03-06 (×2): 10 mg via ORAL
  Filled 2018-03-06 (×2): qty 1

## 2018-03-06 MED ORDER — NIFEDIPINE 10 MG PO CAPS
10.0000 mg | ORAL_CAPSULE | Freq: Once | ORAL | Status: AC
  Administered 2018-03-06: 10 mg via ORAL
  Filled 2018-03-06: qty 1

## 2018-03-06 NOTE — Discharge Instructions (Signed)
Braxton Hicks Contractions Contractions of the uterus can occur throughout pregnancy, but they are not always a sign that you are in labor. You may have practice contractions called Braxton Hicks contractions. These false labor contractions are sometimes confused with true labor. What are Braxton Hicks contractions? Braxton Hicks contractions are tightening movements that occur in the muscles of the uterus before labor. Unlike true labor contractions, these contractions do not result in opening (dilation) and thinning of the cervix. Toward the end of pregnancy (32-34 weeks), Braxton Hicks contractions can happen more often and may become stronger. These contractions are sometimes difficult to tell apart from true labor because they can be very uncomfortable. You should not feel embarrassed if you go to the hospital with false labor. Sometimes, the only way to tell if you are in true labor is for your health care provider to look for changes in the cervix. The health care provider will do a physical exam and may monitor your contractions. If you are not in true labor, the exam should show that your cervix is not dilating and your water has not broken. If there are no other health problems associated with your pregnancy, it is completely safe for you to be sent home with false labor. You may continue to have Braxton Hicks contractions until you go into true labor. How to tell the difference between true labor and false labor True labor  Contractions last 30-70 seconds.  Contractions become very regular.  Discomfort is usually felt in the top of the uterus, and it spreads to the lower abdomen and low back.  Contractions do not go away with walking.  Contractions usually become more intense and increase in frequency.  The cervix dilates and gets thinner. False labor  Contractions are usually shorter and not as strong as true labor contractions.  Contractions are usually irregular.  Contractions  are often felt in the front of the lower abdomen and in the groin.  Contractions may go away when you walk around or change positions while lying down.  Contractions get weaker and are shorter-lasting as time goes on.  The cervix usually does not dilate or become thin. Follow these instructions at home:   Take over-the-counter and prescription medicines only as told by your health care provider.  Keep up with your usual exercises and follow other instructions from your health care provider.  Eat and drink lightly if you think you are going into labor.  If Braxton Hicks contractions are making you uncomfortable: ? Change your position from lying down or resting to walking, or change from walking to resting. ? Sit and rest in a tub of warm water. ? Drink enough fluid to keep your urine pale yellow. Dehydration may cause these contractions. ? Do slow and deep breathing several times an hour.  Keep all follow-up prenatal visits as told by your health care provider. This is important. Contact a health care provider if:  You have a fever.  You have continuous pain in your abdomen. Get help right away if:  Your contractions become stronger, more regular, and closer together.  You have fluid leaking or gushing from your vagina.  You pass blood-tinged mucus (bloody show).  You have bleeding from your vagina.  You have low back pain that you never had before.  You feel your baby's head pushing down and causing pelvic pressure.  Your baby is not moving inside you as much as it used to. Summary  Contractions that occur before labor are   called Braxton Hicks contractions, false labor, or practice contractions.  Braxton Hicks contractions are usually shorter, weaker, farther apart, and less regular than true labor contractions. True labor contractions usually become progressively stronger and regular, and they become more frequent.  Manage discomfort from Braxton Hicks contractions  by changing position, resting in a warm bath, drinking plenty of water, or practicing deep breathing. This information is not intended to replace advice given to you by your health care provider. Make sure you discuss any questions you have with your health care provider. Document Released: 07/19/2016 Document Revised: 12/18/2016 Document Reviewed: 07/19/2016 Elsevier Interactive Patient Education  2019 [employer]  

## 2018-03-06 NOTE — MAU Note (Signed)
CTX about 5 min apart since 1900.  Reports feeling "wet down there" since then.  Reports feeling good fetal movement.  Denies bleeding.  Rates pain 5/10.

## 2018-03-06 NOTE — MAU Note (Signed)
Urine in lab 

## 2018-03-06 NOTE — MAU Provider Note (Signed)
History     CSN: [id number]  Arrival date and time: 03/06/18 1059   First Provider Initiated Contact with Patient 03/06/18 1140     [id number] @34 .5 presenting with ctx. Ctx started last night around 7pm. Frequency was q5 min then. She was able to fall asleep but then woke around 2am and having ctx since. Unsure of frequency now. Feeling them in her back. Since last night her underwear have felt moist. No gush of fluid. No recent IC. No VB. Reports good FM. She is hydrating well.   OB History    Gravida  6   Para  2   Term  2   Preterm  0   AB  3   Living  2     SAB  3   TAB  0   Ectopic  0   Multiple  0   Live Births  2           Past Medical History:  Diagnosis Date  . Anxiety   . Depression    since 12/18 from MAB  . Meningitis   . PTSD (post-traumatic stress disorder) 2019  . Spinal headache     Past Surgical History:  Procedure Laterality Date  . APPENDECTOMY    . CESAREAN SECTION N/A 12/30/2015   Procedure: CESAREAN SECTION;  Surgeon: Adam PhenixJames G Arnold, MD;  [facility];  Service: Obstetrics;  Laterality: N/A;  . CHOLECYSTECTOMY    . spnal tap    . URETER SURGERY      Family History  Problem Relation Age of Onset  . Cancer Maternal Aunt   . Cancer Maternal Uncle   . Diabetes Paternal Aunt   . Diabetes Paternal Uncle   . Diabetes Maternal Grandmother   . Cancer Maternal Grandfather   . Cancer Paternal Grandmother   . Diabetes Paternal Grandmother   . Diabetes Paternal Grandfather     Social History   Tobacco Use  . Smoking status: Former Games developermoker  . Smokeless tobacco: Former Engineer, waterUser  Substance Use Topics  . Alcohol use: No  . Drug use: No    Allergies:  Allergies  Allergen Reactions  . Divalproex Sodium Other (See Comments)    Reaction:  Memory loss   . Demerol [Meperidine] Hives    Medications Prior to Admission  Medication Sig Dispense Refill Last Dose  . aspirin 81 MG chewable tablet Chew 1 tablet (81 mg  total) by mouth daily. 60 tablet 3 Taking  . butalbital-acetaminophen-caffeine (FIORICET, ESGIC) 50-325-40 MG tablet Take 1-2 tablets by mouth every 6 (six) hours as needed for headache. 20 tablet 0 Taking  . citalopram (CELEXA) 20 MG tablet Take 1 tablet (20 mg total) by mouth daily. 30 tablet 3 Taking  . cyclobenzaprine (FLEXERIL) 10 MG tablet Take 1 tablet (10 mg total) by mouth every 8 (eight) hours as needed for muscle spasms. 30 tablet 0 Taking  . Elastic Bandages & Supports (COMFORT FIT MATERNITY SUPP MED) MISC 1 Device by Does not apply route daily. 1 each 0 Taking  . Prenatal Vit-Fe Fumarate-FA (MULTIVITAMIN-PRENATAL) 27-0.8 MG TABS tablet Take 1 tablet by mouth daily at 12 noon.   Taking    Review of Systems  Gastrointestinal: Positive for abdominal pain.  Genitourinary: Positive for vaginal discharge. Negative for vaginal bleeding.  Musculoskeletal: Positive for back pain.   Physical Exam   Blood pressure 116/74, pulse 70, temperature 98.3 F (36.8 C), temperature source Oral, resp. rate 16, last menstrual period 12/19/2016, unknown if  currently breastfeeding.  Physical Exam  Constitutional: She is oriented to person, place, and time. She appears well-developed and well-nourished. No distress.  HENT:  Head: Normocephalic and atraumatic.  Neck: Normal range of motion.  Cardiovascular: Normal rate.  Respiratory: Effort normal. No respiratory distress.  GI: Soft. She exhibits no distension. There is no abdominal tenderness.  gravid  Genitourinary:    Genitourinary Comments: SSE: no pool, fern neg SVE: closed/thick   Musculoskeletal: Normal range of motion.  Neurological: She is alert and oriented to person, place, and time.  Skin: Skin is warm and dry.  Psychiatric: She has a normal mood and affect.  EFM: 125 bpm, mod variability, + accels, no decels Toco: irritability  Results for orders placed or performed during the hospital encounter of 03/06/18 (from the past 24  hour(s))  Urinalysis, Routine w reflex microscopic     Status: Abnormal   Collection Time: 03/06/18 11:16 AM  Result Value Ref Range   Color, Urine YELLOW YELLOW   APPearance CLOUDY (A) CLEAR   Specific Gravity, Urine 1.005 1.005 - 1.030   pH 7.0 5.0 - 8.0   Glucose, UA NEGATIVE NEGATIVE mg/dL   Hgb urine dipstick NEGATIVE NEGATIVE   Bilirubin Urine NEGATIVE NEGATIVE   Ketones, ur NEGATIVE NEGATIVE mg/dL   Protein, ur NEGATIVE NEGATIVE mg/dL   Nitrite NEGATIVE NEGATIVE   Leukocytes, UA NEGATIVE NEGATIVE  Fern Test     Status: None   Collection Time: 03/06/18 11:36 AM  Result Value Ref Range   POCT Fern Test Negative = intact amniotic membranes    MAU Course  Procedures Procardia x3  MDM Labs ordered and reviewed. No evidence of PTL or PROM. Pt feeling better after Procardia, ctx rare. Stable for discharge home.  Assessment and Plan   1. [redacted] weeks gestation of pregnancy   2. History of postpartum depression, currently pregnant   3. History of cesarean delivery, antepartum   4. NST (non-stress test) reactive   5. Preterm uterine contractions in third trimester, antepartum    [facility] PTL precautions  Allergies as of 03/06/2018      Reactions   Divalproex Sodium Other (See Comments)   Reaction:  Memory loss    Demerol [meperidine] Hives      Medication List    TAKE these medications   aspirin 81 MG chewable tablet Chew 1 tablet (81 mg total) by mouth daily.   butalbital-acetaminophen-caffeine 50-325-40 MG tablet Commonly known as:  FIORICET, ESGIC Take 1-2 tablets by mouth every 6 (six) hours as needed for headache.   citalopram 20 MG tablet Commonly known as:  CELEXA Take 1 tablet (20 mg total) by mouth daily.   COMFORT FIT MATERNITY SUPP MED Misc 1 Device by Does not apply route daily.   cyclobenzaprine 10 MG tablet Commonly known as:  FLEXERIL Take 1 tablet (10 mg total) by mouth every 8 (eight) hours as  needed for muscle spasms.   multivitamin-prenatal 27-0.8 MG Tabs tablet Take 1 tablet by mouth daily at 12 noon.      Donette LarryMelanie Kailie Polus, CNM 03/06/2018, 2:20 PM

## 2018-03-08 ENCOUNTER — Other Ambulatory Visit: Payer: Self-pay | Admitting: Certified Nurse Midwife

## 2018-03-13 ENCOUNTER — Other Ambulatory Visit (HOSPITAL_COMMUNITY)
Admission: RE | Admit: 2018-03-13 | Discharge: 2018-03-13 | Disposition: A | Discharge status: Home / Self Care | Payer: Medicaid Other | Source: Ambulatory Visit | Attending: Obstetrics & Gynecology | Admitting: Obstetrics & Gynecology

## 2018-03-13 ENCOUNTER — Ambulatory Visit (INDEPENDENT_AMBULATORY_CARE_PROVIDER_SITE_OTHER): Payer: Medicaid Other | Admitting: Obstetrics & Gynecology

## 2018-03-13 VITALS — BP 111/67 | HR 85 | Wt 215.0 lb

## 2018-03-13 DIAGNOSIS — Z3480 Encounter for supervision of other normal pregnancy, unspecified trimester: Secondary | ICD-10-CM

## 2018-03-13 DIAGNOSIS — O3663X0 Maternal care for excessive fetal growth, third trimester, not applicable or unspecified: Secondary | ICD-10-CM

## 2018-03-13 DIAGNOSIS — O34219 Maternal care for unspecified type scar from previous cesarean delivery: Secondary | ICD-10-CM

## 2018-03-13 NOTE — Progress Notes (Signed)
   PRENATAL VISIT NOTE  Subjective:  Laura [name] is a 22 y.o. [phone] at [redacted]w[redacted]d being seen today for ongoing prenatal care.  She is currently monitored for the following issues for this high-risk pregnancy and has Supervision of other normal pregnancy, antepartum; MDD (major depressive disorder), single episode, severe , no psychosis (HCC); Recurrent pregnancy loss; History of postpartum depression, currently pregnant; History of cesarean delivery, antepartum; and LGA (large for gestational age) fetus affecting management of mother on their problem list.  Patient reports no complaints.  Contractions: Irritability. Vag. Bleeding: None.  Movement: Present. Denies leaking of fluid.   The following portions of the patient's history were reviewed and updated as appropriate: allergies, current medications, past family history, past medical history, past social history, past surgical history and problem list. Problem list updated.  Objective:   Vitals:   03/13/18 1553  BP: 111/67  Pulse: 85  Weight: 215 lb (97.5 kg)    Fetal Status: Fetal Heart Rate (bpm): 134   Movement: Present     General:  Alert, oriented and cooperative. Patient is in no acute distress.  Skin: Skin is warm and dry. No rash noted.   Cardiovascular: Normal heart rate noted  Respiratory: Normal respiratory effort, no problems with respiration noted  Abdomen: Soft, gravid, appropriate for gestational age.  Pain/Pressure: Present     Pelvic: Cervical exam performed        Extremities: Normal range of motion.  Edema: Trace  Mental Status: Normal mood and affect. Normal behavior. Normal judgment and thought content.   Assessment and Plan:  Pregnancy: [id number] at 458w5d  1. Excessive fetal growth affecting management of pregnancy in third trimester, single or unspecified fetus   2. Supervision of other normal pregnancy, antepartum - cultures today  3. History of cesarean delivery, antepartum -She would like a  TOLAC, but will get an u/s next month for EFW. She may decide to go with RLTCS depending on the fetal weight  Preterm labor symptoms and general obstetric precautions including but not limited to vaginal bleeding, contractions, leaking of fluid and fetal movement were reviewed in detail with the patient. Please refer to After Visit Summary for other counseling recommendations.  No follow-ups on file.  Future Appointments  Date Time Provider Department Center  03/20/2018  3:45 PM Lesly DukesLeggett, Kelly H, MD [facility]  03/31/2018  7:45 AM [facility] US 2 [facility]  05/02/2018  9:10 AM Drema DallasJaffe, Adam R, DO [facility] None    Allie BossierMyra C Rebekah Sprinkle, MD

## 2018-03-14 ENCOUNTER — Other Ambulatory Visit: Payer: Self-pay | Admitting: Certified Nurse Midwife

## 2018-03-14 LAB — CERVICOVAGINAL ANCILLARY ONLY
Chlamydia: NEGATIVE
Neisseria Gonorrhea: NEGATIVE

## 2018-03-16 LAB — CULTURE, BETA STREP (GROUP B ONLY)
MICRO NUMBER:: 91541343
SPECIMEN QUALITY:: ADEQUATE

## 2018-03-18 ENCOUNTER — Ambulatory Visit (INDEPENDENT_AMBULATORY_CARE_PROVIDER_SITE_OTHER): Payer: Medicaid Other | Admitting: Certified Nurse Midwife

## 2018-03-18 VITALS — BP 110/72 | HR 87 | Wt 217.0 lb

## 2018-03-18 DIAGNOSIS — O34219 Maternal care for unspecified type scar from previous cesarean delivery: Secondary | ICD-10-CM

## 2018-03-18 DIAGNOSIS — Z3480 Encounter for supervision of other normal pregnancy, unspecified trimester: Secondary | ICD-10-CM

## 2018-03-18 DIAGNOSIS — H6690 Otitis media, unspecified, unspecified ear: Secondary | ICD-10-CM

## 2018-03-18 DIAGNOSIS — O3663X0 Maternal care for excessive fetal growth, third trimester, not applicable or unspecified: Secondary | ICD-10-CM

## 2018-03-18 DIAGNOSIS — Z3483 Encounter for supervision of other normal pregnancy, third trimester: Secondary | ICD-10-CM

## 2018-03-18 MED ORDER — AMOXICILLIN-POT CLAVULANATE 875-125 MG PO TABS: 1.0000 | ORAL_TABLET | Freq: Two times a day (BID) | ORAL | 0 refills | Status: DC

## 2018-03-18 NOTE — Progress Notes (Signed)
Subjective:  Laura SheffieldRebecca [name] is a 22 y.o. [id number] at 3472w3d being seen today for ongoing prenatal care.  She is currently monitored for the following issues for this high-risk pregnancy and has Supervision of other normal pregnancy, antepartum; MDD (major depressive disorder), single episode, severe , no psychosis (HCC); Recurrent pregnancy loss; History of postpartum depression, currently pregnant; History of cesarean delivery, antepartum; and LGA (large for gestational age) fetus affecting management of mother on their problem list.  Patient reports left ear pain, nasal congestion, rhinorrhea. No sore throat, HA, cough, or fever. Contractions: Not present.  .  Movement: Present. Denies leaking of fluid.   The following portions of the patient's history were reviewed and updated as appropriate: allergies, current medications, past family history, past medical history, past social history, past surgical history and problem list. Problem list updated.  Objective:   Vitals:   03/18/18 1132  BP: 110/72  Pulse: 87  Weight: 98.4 kg    Fetal Status: Fetal Heart Rate (bpm): 138 Fundal Height: 40 cm Movement: Present  Presentation: Vertex  General:  Alert, oriented and cooperative. Patient is in no acute distress.  Skin: Skin is warm and dry. No rash noted.   Cardiovascular: Normal heart rate noted  Respiratory: Normal respiratory effort, no problems with respiration noted  Abdomen: Soft, gravid, appropriate for gestational age. Pain/Pressure: Present     Pelvic:       Cervical exam deferred        Extremities: Normal range of motion.     Mental Status: Normal mood and affect. Normal behavior. Normal judgment and thought content.  HHENT: Ears: left bulging tympanic membrane with erythema; right: normal Urinalysis:      Assessment and Plan:  Pregnancy: [id number] at 1272w3d  1. History of cesarean delivery, antepartum - planning TOLAC, consent signed  2. Supervision of other normal  pregnancy, antepartum  3. Excessive fetal growth affecting management of pregnancy in third trimester, single or unspecified fetus - suspected LGA, pelvis proven to 9+ lbs - f/u growth US in 2 weeks  4. Otitis media - Rx Augmentin  Preterm labor symptoms and general obstetric precautions including but not limited to vaginal bleeding, contractions, leaking of fluid and fetal movement were reviewed in detail with the patient. Please refer to After Visit Summary for other counseling recommendations.  Return in about 1 week (around 03/25/2018).   Donette LarryBhambri, Lucienne Sawyers, CNM

## 2018-03-20 ENCOUNTER — Encounter: Payer: Self-pay | Admitting: Obstetrics & Gynecology

## 2018-03-25 ENCOUNTER — Encounter: Payer: Self-pay | Admitting: *Deleted

## 2018-03-25 ENCOUNTER — Encounter: Payer: Self-pay | Admitting: Certified Nurse Midwife

## 2018-03-25 ENCOUNTER — Ambulatory Visit (INDEPENDENT_AMBULATORY_CARE_PROVIDER_SITE_OTHER): Payer: Medicaid Other | Admitting: Certified Nurse Midwife

## 2018-03-25 VITALS — BP 110/68 | HR 80 | Wt 219.1 lb

## 2018-03-25 DIAGNOSIS — O3663X0 Maternal care for excessive fetal growth, third trimester, not applicable or unspecified: Secondary | ICD-10-CM

## 2018-03-25 DIAGNOSIS — Z3483 Encounter for supervision of other normal pregnancy, third trimester: Secondary | ICD-10-CM

## 2018-03-25 DIAGNOSIS — Z3A37 37 weeks gestation of pregnancy: Secondary | ICD-10-CM

## 2018-03-25 DIAGNOSIS — Z3480 Encounter for supervision of other normal pregnancy, unspecified trimester: Secondary | ICD-10-CM

## 2018-03-25 DIAGNOSIS — O34219 Maternal care for unspecified type scar from previous cesarean delivery: Secondary | ICD-10-CM

## 2018-03-25 NOTE — Patient Instructions (Signed)
Reasons to go to MAU:  1.  Contractions are  5 minutes apart or less, each last 1 minute, these have been going on for 1-2 hours, and you cannot walk or talk during them 2.  You have a large gush of fluid, or a trickle of fluid that will not stop and you have to wear a pad 3.  You have bleeding that is bright red, heavier than spotting--like menstrual bleeding (spotting can be normal in early labor or after a check of your cervix) 4.  You do not feel the baby moving like he/she normally does  

## 2018-03-26 NOTE — Progress Notes (Signed)
   PRENATAL VISIT NOTE  Subjective:  Laura [name] is a 23 y.o. [phone] at [redacted]w[redacted]d being seen today for ongoing prenatal care.  She is currently monitored for the following issues for this low-risk pregnancy and has Supervision of other normal pregnancy, antepartum; MDD (major depressive disorder), single episode, severe , no psychosis (HCC); Recurrent pregnancy loss; History of postpartum depression, currently pregnant; History of cesarean delivery, antepartum; and LGA (large for gestational age) fetus affecting management of mother on their problem list.  Patient reports no complaints.  Contractions: Irritability. Vag. Bleeding: None.  Movement: Present. Denies leaking of fluid.   The following portions of the patient's history were reviewed and updated as appropriate: allergies, current medications, past family history, past medical history, past social history, past surgical history and problem list. Problem list updated.  Objective:  Vitals:   03/25/18 1612  BP: 110/68  Pulse: 80  Weight: 219 lb 1.9 oz (99.4 kg)    Fetal Status: Fetal Heart Rate (bpm): 126 Fundal Height: 41 cm Movement: Present  Presentation: Vertex  General:  Alert, oriented and cooperative. Patient is in no acute distress.  Skin: Skin is warm and dry. No rash noted.   Cardiovascular: Normal heart rate noted  Respiratory: Normal respiratory effort, no problems with respiration noted  Abdomen: Soft, gravid, appropriate for gestational age.  Pain/Pressure: Present     Pelvic: Cervical exam deferred        Extremities: Normal range of motion.  Edema: None  Mental Status: Normal mood and affect. Normal behavior. Normal judgment and thought content.   Assessment and Plan:  Pregnancy: [id number] at [redacted]w[redacted]d  1. Supervision of other normal pregnancy, antepartum - Patient doing well, no complaints  - Anticipatory guidance on upcoming appointments  - Educated and discussed EPO, RRT, IC to induce contractions - F/U US  scheduled for fetal growth  - Membrane sweep at next appointment   2. History of cesarean delivery, antepartum - Plans TOLAC, consent signed   3. Excessive fetal growth affecting management of pregnancy in third trimester, single or unspecified fetus - suspected LGA, pelvis proven to 9+ lbs  - fetal growth [place] scheduled for 03/31/18  - plan for IOL at 40 weeks   Term labor symptoms and general obstetric precautions including but not limited to vaginal bleeding, contractions, leaking of fluid and fetal movement were reviewed in detail with the patient. Please refer to After Visit Summary for other counseling recommendations.  Return in about 1 week (around 04/01/2018) for ROB.  Future Appointments  Date Time Provider Department Center  03/31/2018  7:45 AM [facility] [place] 2 [facility]US  04/04/2018 11:00 AM Rolm Bookbinder, CNM [facility]  05/02/2018  9:10 AM Drema Dallas, DO [facility] None    Sharyon Cable, CNM

## 2018-03-27 ENCOUNTER — Inpatient Hospital Stay (HOSPITAL_COMMUNITY)
Admission: AD | Admit: 2018-03-27 | Discharge: 2018-03-27 | Disposition: A | Discharge status: Home / Self Care | Payer: Medicaid Other | Attending: Obstetrics and Gynecology | Admitting: Obstetrics and Gynecology

## 2018-03-27 ENCOUNTER — Encounter (HOSPITAL_COMMUNITY): Payer: Self-pay

## 2018-03-27 ENCOUNTER — Other Ambulatory Visit: Payer: Self-pay

## 2018-03-27 DIAGNOSIS — Z3A37 37 weeks gestation of pregnancy: Secondary | ICD-10-CM | POA: Diagnosis not present

## 2018-03-27 DIAGNOSIS — O471 False labor at or after 37 completed weeks of gestation: Secondary | ICD-10-CM | POA: Diagnosis not present

## 2018-03-27 DIAGNOSIS — O479 False labor, unspecified: Secondary | ICD-10-CM

## 2018-03-27 NOTE — MAU Note (Signed)
Pt reports contractions all day that worsened around 9pm. Pt states she was able to sleep for about an hour and then they woke her up. States they are about every 4 mins. Pt denies LOF or vaginal bleeding. Reports good fetal movement. No recent cervical exam.

## 2018-03-27 NOTE — MAU Note (Addendum)
I have communicated with Margaretmary LombardStephanie Deloglos student CNM and reviewed vital signs:  Vitals:   03/27/18 0016 03/27/18 0511  BP: 115/77 110/69  Pulse: 81 80  Resp: 18 16  Temp: 98.2 F (36.8 C)   SpO2: 99% 100%    Vaginal exam:  Dilation: 2(2-2.5) Effacement (%): 60 Cervical Position: Posterior Station: -1 Presentation: Vertex Exam by:: Adah Perlhandra Bueford Arp RN,   Also reviewed contraction pattern and that non-stress test is reactive.  It has been documented that patient is contracting every 3-6 minutes with minimal cervical change over 4  hours not indicating active labor.  Patient denies any other complaints.  Based on this report provider has given order for discharge.  A discharge order and diagnosis entered by a provider.   Labor discharge instructions reviewed with patient.

## 2018-03-27 NOTE — Discharge Instructions (Signed)
Braxton Hicks Contractions Contractions of the uterus can occur throughout pregnancy, but they are not always a sign that you are in labor. You may have practice contractions called Braxton Hicks contractions. These false labor contractions are sometimes confused with true labor. What are Braxton Hicks contractions? Braxton Hicks contractions are tightening movements that occur in the muscles of the uterus before labor. Unlike true labor contractions, these contractions do not result in opening (dilation) and thinning of the cervix. Toward the end of pregnancy (32-34 weeks), Braxton Hicks contractions can happen more often and may become stronger. These contractions are sometimes difficult to tell apart from true labor because they can be very uncomfortable. You should not feel embarrassed if you go to the hospital with false labor. Sometimes, the only way to tell if you are in true labor is for your health care provider to look for changes in the cervix. The health care provider will do a physical exam and may monitor your contractions. If you are not in true labor, the exam should show that your cervix is not dilating and your water has not broken. If there are no other health problems associated with your pregnancy, it is completely safe for you to be sent home with false labor. You may continue to have Braxton Hicks contractions until you go into true labor. How to tell the difference between true labor and false labor True labor  Contractions last 30-70 seconds.  Contractions become very regular.  Discomfort is usually felt in the top of the uterus, and it spreads to the lower abdomen and low back.  Contractions do not go away with walking.  Contractions usually become more intense and increase in frequency.  The cervix dilates and gets thinner. False labor  Contractions are usually shorter and not as strong as true labor contractions.  Contractions are usually irregular.  Contractions  are often felt in the front of the lower abdomen and in the groin.  Contractions may go away when you walk around or change positions while lying down.  Contractions get weaker and are shorter-lasting as time goes on.  The cervix usually does not dilate or become thin. Follow these instructions at home:   Take over-the-counter and prescription medicines only as told by your health care provider.  Keep up with your usual exercises and follow other instructions from your health care provider.  Eat and drink lightly if you think you are going into labor.  If Braxton Hicks contractions are making you uncomfortable: ? Change your position from lying down or resting to walking, or change from walking to resting. ? Sit and rest in a tub of warm water. ? Drink enough fluid to keep your urine pale yellow. Dehydration may cause these contractions. ? Do slow and deep breathing several times an hour.  Keep all follow-up prenatal visits as told by your health care provider. This is important. Contact a health care provider if:  You have a fever.  You have continuous pain in your abdomen. Get help right away if:  Your contractions become stronger, more regular, and closer together.  You have fluid leaking or gushing from your vagina.  You pass blood-tinged mucus (bloody show).  You have bleeding from your vagina.  You have low back pain that you never had before.  You feel your baby's head pushing down and causing pelvic pressure.  Your baby is not moving inside you as much as it used to. Summary  Contractions that occur before labor are   called Braxton Hicks contractions, false labor, or practice contractions.  Braxton Hicks contractions are usually shorter, weaker, farther apart, and less regular than true labor contractions. True labor contractions usually become progressively stronger and regular, and they become more frequent.  Manage discomfort from Braxton Hicks contractions  by changing position, resting in a warm bath, drinking plenty of water, or practicing deep breathing. This information is not intended to replace advice given to you by your health care provider. Make sure you discuss any questions you have with your health care provider. Document Released: 07/19/2016 Document Revised: 12/18/2016 Document Reviewed: 07/19/2016 Elsevier Interactive Patient Education  2019 [employer]  

## 2018-03-31 ENCOUNTER — Encounter (HOSPITAL_COMMUNITY): Payer: Self-pay

## 2018-03-31 ENCOUNTER — Ambulatory Visit (HOSPITAL_COMMUNITY)
Admission: RE | Admit: 2018-03-31 | Discharge: 2018-03-31 | Disposition: A | Discharge status: Home / Self Care | Payer: Medicaid Other | Source: Ambulatory Visit | Attending: Advanced Practice Midwife | Admitting: Advanced Practice Midwife

## 2018-03-31 DIAGNOSIS — O26843 Uterine size-date discrepancy, third trimester: Secondary | ICD-10-CM | POA: Diagnosis not present

## 2018-03-31 DIAGNOSIS — Z3A38 38 weeks gestation of pregnancy: Secondary | ICD-10-CM | POA: Diagnosis not present

## 2018-03-31 DIAGNOSIS — O34219 Maternal care for unspecified type scar from previous cesarean delivery: Secondary | ICD-10-CM | POA: Diagnosis not present

## 2018-04-01 ENCOUNTER — Encounter (HOSPITAL_COMMUNITY): Payer: Self-pay

## 2018-04-03 ENCOUNTER — Telehealth: Payer: Self-pay

## 2018-04-03 ENCOUNTER — Other Ambulatory Visit: Payer: Self-pay | Admitting: Obstetrics and Gynecology

## 2018-04-03 NOTE — Telephone Encounter (Addendum)
PT called stating that she had "peach or light pink colored discharge" this morning and wants to make sure it is okay. Pt states baby is moving well and she has had back pain but no contractions. Pt is scheduled for C Section 04/05/18. I told pt to just monitor it and if anything changes like she starts to see bright red blood or she is not feeling baby move then she should give [place] a call/report to MAU.  Pt expressed understanding.

## 2018-04-04 ENCOUNTER — Encounter (HOSPITAL_COMMUNITY)
Admission: RE | Admit: 2018-04-04 | Discharge: 2018-04-04 | Disposition: A | Discharge status: Home / Self Care | Payer: Medicaid Other | Source: Ambulatory Visit | Attending: Obstetrics and Gynecology | Admitting: Obstetrics and Gynecology

## 2018-04-04 ENCOUNTER — Ambulatory Visit (INDEPENDENT_AMBULATORY_CARE_PROVIDER_SITE_OTHER): Payer: Medicaid Other

## 2018-04-04 VITALS — BP 117/71 | HR 72 | Wt 220.0 lb

## 2018-04-04 DIAGNOSIS — Z3480 Encounter for supervision of other normal pregnancy, unspecified trimester: Secondary | ICD-10-CM

## 2018-04-04 DIAGNOSIS — Z01812 Encounter for preprocedural laboratory examination: Secondary | ICD-10-CM

## 2018-04-04 DIAGNOSIS — Z3483 Encounter for supervision of other normal pregnancy, third trimester: Secondary | ICD-10-CM

## 2018-04-04 DIAGNOSIS — Z3A38 38 weeks gestation of pregnancy: Secondary | ICD-10-CM

## 2018-04-04 DIAGNOSIS — O26843 Uterine size-date discrepancy, third trimester: Secondary | ICD-10-CM

## 2018-04-04 DIAGNOSIS — O34219 Maternal care for unspecified type scar from previous cesarean delivery: Secondary | ICD-10-CM

## 2018-04-04 LAB — TYPE AND SCREEN
ABO/RH(D): A POS
Antibody Screen: NEGATIVE

## 2018-04-04 LAB — CBC
HCT: 37.5 % (ref 36.0–46.0)
Hemoglobin: 12.6 g/dL (ref 12.0–15.0)
MCH: 30.7 pg (ref 26.0–34.0)
MCHC: 33.6 g/dL (ref 30.0–36.0)
MCV: 91.2 fL (ref 80.0–100.0)
Platelets: 195 10*3/uL (ref 150–400)
RBC: 4.11 MIL/uL (ref 3.87–5.11)
RDW: 12.7 % (ref 11.5–15.5)
WBC: 9.2 10*3/uL (ref 4.0–10.5)
nRBC: 0 % (ref 0.0–0.2)

## 2018-04-04 NOTE — Patient Instructions (Signed)
Corrin Hayhurst  04/04/2018   Your procedure is scheduled on:  04/05/2018  Enter through the Main Entrance of [facility] at 0930 AM.  Pick up the phone at the desk and dial [phone]  Call this number if you have problems the morning of surgery:[phone]  Remember:   Do not eat food:(After Midnight) Desps de medianoche.  Do not drink clear liquids: (After Midnight) Desps de medianoche.  Take these medicines the morning of surgery with A SIP OF WATER: none   Do not wear jewelry, make-up or nail polish.  Do not wear lotions, powders, or perfumes. Do not wear deodorant.  Do not shave 48 hours prior to surgery.  Do not bring valuables to the hospital.  [facility] is not   responsible for any belongings or valuables brought to the hospital.  Contacts, dentures or bridgework may not be worn into surgery.  Leave suitcase in the car. After surgery it may be brought to your room.  For patients admitted to the hospital, checkout time is 11:00 AM the day of              discharge.    N/A   Please read over the following fact sheets that you were given:   Surgical Site Infection Prevention

## 2018-04-04 NOTE — Progress Notes (Signed)
   PRENATAL VISIT NOTE  Subjective:  Laura [name] is a 23 y.o. [phone] at [redacted]w[redacted]d being seen today for ongoing prenatal care.  She is currently monitored for the following issues for this high-risk pregnancy and has Supervision of other normal pregnancy, antepartum; MDD (major depressive disorder), single episode, severe , no psychosis (HCC); Recurrent pregnancy loss; History of postpartum depression, currently pregnant; History of cesarean delivery, antepartum; and LGA (large for gestational age) fetus affecting management of mother on their problem list.  Patient reports no complaints.  Contractions: Irritability. Vag. Bleeding: Bloody Show.  Movement: Present. Denies leaking of fluid.   The following portions of the patient's history were reviewed and updated as appropriate: allergies, current medications, past family history, past medical history, past social history, past surgical history and problem list. Problem list updated.  Objective:   Vitals:   04/04/18 1028  BP: 117/71  Pulse: 72  Weight: 220 lb (99.8 kg)    Fetal Status: Fetal Heart Rate (bpm): 129 Fundal Height: 41 cm Movement: Present  Presentation: Vertex  General:  Alert, oriented and cooperative. Patient is in no acute distress.  Skin: Skin is warm and dry. No rash noted.   Cardiovascular: Normal heart rate noted  Respiratory: Normal respiratory effort, no problems with respiration noted  Abdomen: Soft, gravid, appropriate for gestational age.  Pain/Pressure: Present     Pelvic: Cervical exam deferred        Extremities: Normal range of motion.  Edema: Trace  Mental Status: Normal mood and affect. Normal behavior. Normal judgment and thought content.   Assessment and Plan:  Pregnancy: [id number] at [redacted]w[redacted]d  1. Supervision of other normal pregnancy, antepartum   2. History of cesarean delivery, antepartum   3. Uterine size date discrepancy pregnancy, third trimester     Scheduled for repeat c/s in am.  Discussed  risks and benefits of TOLAC versus repeat c-section.  Would like to continue with plan to do repeat c-section tomorrow AM.  Plans for IUD placement at 4 weeks postpartum.  . Term labor symptoms and general obstetric precautions including but not limited to vaginal bleeding, contractions, leaking of fluid and fetal movement were reviewed in detail with the patient. Please refer to After Visit Summary for other counseling recommendations.  Return in about 4 weeks (around 05/02/2018) for Postpartum.  Future Appointments  Date Time Provider Department Center  04/18/2018  9:30 AM Rennie Plowman [facility]  05/01/2018  2:00 PM Allie Bossier, MD [facility]  05/02/2018  9:10 AM Drema Dallas, DO [facility] None    Lorina Rabon, RN

## 2018-04-04 NOTE — Patient Instructions (Signed)

## 2018-04-04 NOTE — Progress Notes (Signed)
Patient wants a clear drape

## 2018-04-05 ENCOUNTER — Encounter (HOSPITAL_COMMUNITY): Payer: Self-pay | Admitting: Anesthesiology

## 2018-04-05 ENCOUNTER — Inpatient Hospital Stay (HOSPITAL_COMMUNITY): Payer: Medicaid Other

## 2018-04-05 ENCOUNTER — Other Ambulatory Visit: Payer: Self-pay

## 2018-04-05 ENCOUNTER — Inpatient Hospital Stay (HOSPITAL_COMMUNITY)
Admission: RE | Admit: 2018-04-05 | Discharge: 2018-04-07 | DRG: 788 | Disposition: A | Discharge status: Home / Self Care | Payer: Medicaid Other | Attending: Obstetrics and Gynecology | Admitting: Obstetrics and Gynecology

## 2018-04-05 ENCOUNTER — Encounter (HOSPITAL_COMMUNITY)
Admission: RE | Disposition: A | Discharge status: Home / Self Care | Payer: Self-pay | Source: Home / Self Care | Attending: Obstetrics and Gynecology

## 2018-04-05 DIAGNOSIS — O99214 Obesity complicating childbirth: Secondary | ICD-10-CM | POA: Diagnosis not present

## 2018-04-05 DIAGNOSIS — O34211 Maternal care for low transverse scar from previous cesarean delivery: Secondary | ICD-10-CM | POA: Diagnosis not present

## 2018-04-05 DIAGNOSIS — Z87891 Personal history of nicotine dependence: Secondary | ICD-10-CM | POA: Diagnosis not present

## 2018-04-05 DIAGNOSIS — O34219 Maternal care for unspecified type scar from previous cesarean delivery: Secondary | ICD-10-CM

## 2018-04-05 DIAGNOSIS — O9902 Anemia complicating childbirth: Secondary | ICD-10-CM | POA: Diagnosis present

## 2018-04-05 DIAGNOSIS — O9989 Other specified diseases and conditions complicating pregnancy, childbirth and the puerperium: Secondary | ICD-10-CM

## 2018-04-05 DIAGNOSIS — Z8659 Personal history of other mental and behavioral disorders: Secondary | ICD-10-CM

## 2018-04-05 DIAGNOSIS — O3663X0 Maternal care for excessive fetal growth, third trimester, not applicable or unspecified: Secondary | ICD-10-CM | POA: Diagnosis not present

## 2018-04-05 DIAGNOSIS — D649 Anemia, unspecified: Secondary | ICD-10-CM | POA: Diagnosis not present

## 2018-04-05 DIAGNOSIS — E669 Obesity, unspecified: Secondary | ICD-10-CM | POA: Diagnosis not present

## 2018-04-05 DIAGNOSIS — Z3A39 39 weeks gestation of pregnancy: Secondary | ICD-10-CM

## 2018-04-05 DIAGNOSIS — O99891 Other specified diseases and conditions complicating pregnancy: Secondary | ICD-10-CM

## 2018-04-05 DIAGNOSIS — Z98891 History of uterine scar from previous surgery: Secondary | ICD-10-CM

## 2018-04-05 LAB — RPR: RPR Ser Ql: NONREACTIVE

## 2018-04-05 LAB — CBC
HCT: 33.7 % — ABNORMAL LOW (ref 36.0–46.0)
Hemoglobin: 11.5 g/dL — ABNORMAL LOW (ref 12.0–15.0)
MCH: 30.6 pg (ref 26.0–34.0)
MCHC: 34.1 g/dL (ref 30.0–36.0)
MCV: 89.6 fL (ref 80.0–100.0)
Platelets: 190 10*3/uL (ref 150–400)
RBC: 3.76 MIL/uL — AB (ref 3.87–5.11)
RDW: 12.5 % (ref 11.5–15.5)
WBC: 12.6 10*3/uL — ABNORMAL HIGH (ref 4.0–10.5)
nRBC: 0 % (ref 0.0–0.2)

## 2018-04-05 LAB — CREATININE, SERUM
Creatinine, Ser: 0.38 mg/dL — ABNORMAL LOW (ref 0.44–1.00)
GFR calc Af Amer: >60 mL/min (ref >60)
GFR calc non Af Amer: >60 mL/min (ref >60)

## 2018-04-05 SURGERY — Surgical Case
Anesthesia: Spinal | Site: Abdomen | Wound class: Clean Contaminated

## 2018-04-05 MED ORDER — KETOROLAC TROMETHAMINE 30 MG/ML IJ SOLN
30.0000 mg | Freq: Four times a day (QID) | INTRAMUSCULAR | Status: AC | PRN
  Administered 2018-04-05: 30 mg via INTRAVENOUS
  Filled 2018-04-05: qty 1

## 2018-04-05 MED ORDER — NALBUPHINE HCL 10 MG/ML IJ SOLN: 5.0000 mg | INTRAMUSCULAR | Status: DC | PRN

## 2018-04-05 MED ORDER — SIMETHICONE 80 MG PO CHEW
80.0000 mg | CHEWABLE_TABLET | Freq: Three times a day (TID) | ORAL | Status: DC
  Administered 2018-04-06 – 2018-04-07 (×4): 80 mg via ORAL
  Filled 2018-04-05 (×10): qty 1

## 2018-04-05 MED ORDER — NALOXONE HCL 0.4 MG/ML IJ SOLN: 0.4000 mg | INTRAMUSCULAR | Status: DC | PRN

## 2018-04-05 MED ORDER — MORPHINE SULFATE (PF) 0.5 MG/ML IJ SOLN
INTRAMUSCULAR | Status: AC
  Filled 2018-04-05: qty 10

## 2018-04-05 MED ORDER — ONDANSETRON HCL 4 MG/2ML IJ SOLN
4.0000 mg | Freq: Three times a day (TID) | INTRAMUSCULAR | Status: DC | PRN
  Administered 2018-04-05: 4 mg via INTRAVENOUS
  Filled 2018-04-05: qty 2

## 2018-04-05 MED ORDER — ONDANSETRON HCL 4 MG/2ML IJ SOLN
INTRAMUSCULAR | Status: AC
  Filled 2018-04-05: qty 2

## 2018-04-05 MED ORDER — WITCH HAZEL-GLYCERIN EX PADS
1.0000 "application " | MEDICATED_PAD | CUTANEOUS | Status: DC | PRN
  Administered 2018-04-06: 1 via TOPICAL

## 2018-04-05 MED ORDER — OXYTOCIN 10 UNIT/ML IJ SOLN
INTRAVENOUS | Status: DC | PRN
  Administered 2018-04-05: 40 [IU] via INTRAVENOUS

## 2018-04-05 MED ORDER — MENTHOL 3 MG MT LOZG
1.0000 | LOZENGE | OROMUCOSAL | Status: DC | PRN
  Filled 2018-04-05: qty 9

## 2018-04-05 MED ORDER — HYDROMORPHONE HCL 1 MG/ML IJ SOLN: 0.2500 mg | INTRAMUSCULAR | Status: DC | PRN

## 2018-04-05 MED ORDER — BUPIVACAINE IN DEXTROSE 0.75-8.25 % IT SOLN
INTRATHECAL | Status: DC | PRN
  Administered 2018-04-05: 1.8 mL via INTRATHECAL

## 2018-04-05 MED ORDER — SOD CITRATE-CITRIC ACID 500-334 MG/5ML PO SOLN: 30.0000 mL | Freq: Once | ORAL | Status: DC

## 2018-04-05 MED ORDER — LACTATED RINGERS IV SOLN
INTRAVENOUS | Status: DC
  Administered 2018-04-05 (×2): via INTRAVENOUS

## 2018-04-05 MED ORDER — NALOXONE HCL 4 MG/10ML IJ SOLN
1.0000 ug/kg/h | INTRAVENOUS | Status: DC | PRN
  Filled 2018-04-05: qty 5

## 2018-04-05 MED ORDER — DIPHENHYDRAMINE HCL 25 MG PO CAPS
25.0000 mg | ORAL_CAPSULE | Freq: Four times a day (QID) | ORAL | Status: DC | PRN
  Filled 2018-04-05: qty 1

## 2018-04-05 MED ORDER — LACTATED RINGERS IV SOLN
INTRAVENOUS | Status: DC
  Administered 2018-04-05 (×3): via INTRAVENOUS

## 2018-04-05 MED ORDER — COCONUT OIL OIL
1.0000 "application " | TOPICAL_OIL | Status: DC | PRN
  Filled 2018-04-05: qty 120

## 2018-04-05 MED ORDER — DIBUCAINE 1 % RE OINT
1.0000 "application " | TOPICAL_OINTMENT | RECTAL | Status: DC | PRN
  Administered 2018-04-06: 1 via RECTAL
  Filled 2018-04-05 (×2): qty 28

## 2018-04-05 MED ORDER — SCOPOLAMINE 1 MG/3DAYS TD PT72
1.0000 | MEDICATED_PATCH | Freq: Once | TRANSDERMAL | Status: DC
  Administered 2018-04-05: 1.5 mg via TRANSDERMAL

## 2018-04-05 MED ORDER — NALBUPHINE HCL 10 MG/ML IJ SOLN: 5.0000 mg | Freq: Once | INTRAMUSCULAR | Status: DC | PRN

## 2018-04-05 MED ORDER — DIPHENHYDRAMINE HCL 25 MG PO CAPS: 25.0000 mg | ORAL_CAPSULE | ORAL | Status: DC | PRN

## 2018-04-05 MED ORDER — MORPHINE SULFATE (PF) 0.5 MG/ML IJ SOLN
INTRAMUSCULAR | Status: DC | PRN
  Administered 2018-04-05: .15 mg via INTRATHECAL

## 2018-04-05 MED ORDER — IBUPROFEN 800 MG PO TABS
800.0000 mg | ORAL_TABLET | Freq: Three times a day (TID) | ORAL | Status: DC
  Administered 2018-04-05 – 2018-04-07 (×6): 800 mg via ORAL
  Filled 2018-04-05 (×6): qty 1

## 2018-04-05 MED ORDER — SIMETHICONE 80 MG PO CHEW
80.0000 mg | CHEWABLE_TABLET | ORAL | Status: DC | PRN
  Filled 2018-04-05: qty 1

## 2018-04-05 MED ORDER — PRENATAL MULTIVITAMIN CH
1.0000 | ORAL_TABLET | Freq: Every day | ORAL | Status: DC
  Administered 2018-04-06 – 2018-04-07 (×2): 1 via ORAL
  Filled 2018-04-05 (×5): qty 1

## 2018-04-05 MED ORDER — SIMETHICONE 80 MG PO CHEW
80.0000 mg | CHEWABLE_TABLET | ORAL | Status: DC
  Administered 2018-04-05 – 2018-04-07 (×2): 80 mg via ORAL
  Filled 2018-04-05 (×5): qty 1

## 2018-04-05 MED ORDER — FENTANYL CITRATE (PF) 100 MCG/2ML IJ SOLN
INTRAMUSCULAR | Status: DC | PRN
  Administered 2018-04-05: 15 ug via INTRAVENOUS

## 2018-04-05 MED ORDER — PHENYLEPHRINE 8 MG IN D5W 100 ML (0.08MG/ML) PREMIX OPTIME
INJECTION | INTRAVENOUS | Status: AC
  Filled 2018-04-05: qty 100

## 2018-04-05 MED ORDER — OXYTOCIN 40 UNITS IN NORMAL SALINE INFUSION - SIMPLE MED
2.5000 [IU]/h | INTRAVENOUS | Status: AC
  Administered 2018-04-05: 2.5 [IU]/h via INTRAVENOUS

## 2018-04-05 MED ORDER — KETOROLAC TROMETHAMINE 30 MG/ML IJ SOLN: 30.0000 mg | Freq: Four times a day (QID) | INTRAMUSCULAR | Status: AC | PRN

## 2018-04-05 MED ORDER — FENTANYL CITRATE (PF) 100 MCG/2ML IJ SOLN
INTRAMUSCULAR | Status: AC
  Filled 2018-04-05: qty 2

## 2018-04-05 MED ORDER — CITALOPRAM HYDROBROMIDE 20 MG PO TABS
20.0000 mg | ORAL_TABLET | Freq: Every day | ORAL | Status: DC
  Administered 2018-04-05 – 2018-04-06 (×2): 20 mg via ORAL
  Filled 2018-04-05 (×5): qty 1

## 2018-04-05 MED ORDER — PHENYLEPHRINE 8 MG IN D5W 100 ML (0.08MG/ML) PREMIX OPTIME
INJECTION | INTRAVENOUS | Status: DC | PRN
  Administered 2018-04-05: 60 ug/min via INTRAVENOUS

## 2018-04-05 MED ORDER — OXYCODONE-ACETAMINOPHEN 5-325 MG PO TABS
1.0000 | ORAL_TABLET | ORAL | Status: DC | PRN
  Administered 2018-04-06: 1 via ORAL
  Filled 2018-04-05: qty 1

## 2018-04-05 MED ORDER — OXYTOCIN 10 UNIT/ML IJ SOLN
INTRAMUSCULAR | Status: AC
  Filled 2018-04-05: qty 4

## 2018-04-05 MED ORDER — EPHEDRINE 5 MG/ML INJ
INTRAVENOUS | Status: AC
  Filled 2018-04-05: qty 10

## 2018-04-05 MED ORDER — EPHEDRINE SULFATE 50 MG/ML IJ SOLN
INTRAMUSCULAR | Status: DC | PRN
  Administered 2018-04-05 (×4): 5 mg via INTRAVENOUS

## 2018-04-05 MED ORDER — TETANUS-DIPHTH-ACELL PERTUSSIS 5-2.5-18.5 LF-MCG/0.5 IM SUSP
0.5000 mL | Freq: Once | INTRAMUSCULAR | Status: DC
  Filled 2018-04-05: qty 0.5

## 2018-04-05 MED ORDER — GABAPENTIN 100 MG PO CAPS
100.0000 mg | ORAL_CAPSULE | Freq: Three times a day (TID) | ORAL | Status: DC
  Administered 2018-04-05 – 2018-04-07 (×4): 100 mg via ORAL
  Filled 2018-04-05 (×7): qty 1

## 2018-04-05 MED ORDER — DIPHENHYDRAMINE HCL 50 MG/ML IJ SOLN
12.5000 mg | INTRAMUSCULAR | Status: DC | PRN
  Administered 2018-04-05: 12.5 mg via INTRAVENOUS
  Filled 2018-04-05: qty 1

## 2018-04-05 MED ORDER — DEXAMETHASONE SODIUM PHOSPHATE 10 MG/ML IJ SOLN
INTRAMUSCULAR | Status: AC
  Filled 2018-04-05: qty 1

## 2018-04-05 MED ORDER — CEFAZOLIN SODIUM-DEXTROSE 2-4 GM/100ML-% IV SOLN
2.0000 g | INTRAVENOUS | Status: AC
  Administered 2018-04-05: 2 g via INTRAVENOUS

## 2018-04-05 MED ORDER — ENOXAPARIN SODIUM 60 MG/0.6ML ~~LOC~~ SOLN
50.0000 mg | SUBCUTANEOUS | Status: DC
  Administered 2018-04-06 – 2018-04-07 (×2): 50 mg via SUBCUTANEOUS
  Filled 2018-04-05 (×3): qty 0.6

## 2018-04-05 MED ORDER — SODIUM CHLORIDE 0.9% FLUSH: 3.0000 mL | INTRAVENOUS | Status: DC | PRN

## 2018-04-05 MED ORDER — DEXAMETHASONE SODIUM PHOSPHATE 10 MG/ML IJ SOLN
INTRAMUSCULAR | Status: DC | PRN
  Administered 2018-04-05: 10 mg via INTRAVENOUS

## 2018-04-05 MED ORDER — ONDANSETRON HCL 4 MG/2ML IJ SOLN
INTRAMUSCULAR | Status: DC | PRN
  Administered 2018-04-05: 4 mg via INTRAVENOUS

## 2018-04-05 MED ORDER — SENNOSIDES-DOCUSATE SODIUM 8.6-50 MG PO TABS
2.0000 | ORAL_TABLET | ORAL | Status: DC
  Administered 2018-04-05 – 2018-04-07 (×2): 2 via ORAL
  Filled 2018-04-05 (×4): qty 2

## 2018-04-05 MED ORDER — SOD CITRATE-CITRIC ACID 500-334 MG/5ML PO SOLN
ORAL | Status: AC
  Administered 2018-04-05: 30 mL
  Filled 2018-04-05: qty 15

## 2018-04-05 SURGICAL SUPPLY — 32 items
BENZOIN TINCTURE PRP APPL 2/3 (GAUZE/BANDAGES/DRESSINGS) ×3 IMPLANT
CHLORAPREP W/TINT 26ML (MISCELLANEOUS) ×3 IMPLANT
CLAMP CORD UMBIL (MISCELLANEOUS) IMPLANT
CLOSURE WOUND 1/4X4 (GAUZE/BANDAGES/DRESSINGS) ×1
DRSG OPSITE POSTOP 4X10 (GAUZE/BANDAGES/DRESSINGS) ×3 IMPLANT
ELECT REM PT RETURN 9FT ADLT (ELECTROSURGICAL) ×3
ELECTRODE REM PT RTRN 9FT ADLT (ELECTROSURGICAL) ×1 IMPLANT
EXTRACTOR VACUUM M CUP 4 TUBE (SUCTIONS) ×2 IMPLANT
EXTRACTOR VACUUM M CUP 4' TUBE (SUCTIONS) ×1
GAUZE SPONGE 4X4 12PLY STRL (GAUZE/BANDAGES/DRESSINGS) ×3 IMPLANT
GLOVE BIOGEL PI IND STRL 6.5 (GLOVE) ×1 IMPLANT
GLOVE BIOGEL PI IND STRL 7.0 (GLOVE) ×1 IMPLANT
GLOVE BIOGEL PI INDICATOR 6.5 (GLOVE) ×2
GLOVE BIOGEL PI INDICATOR 7.0 (GLOVE) ×2
GLOVE SURG SS PI 6.0 STRL IVOR (GLOVE) ×3 IMPLANT
GOWN STRL REUS W/TWL LRG LVL3 (GOWN DISPOSABLE) ×6 IMPLANT
KIT ABG SYR 3ML LUER SLIP (SYRINGE) IMPLANT
NEEDLE HYPO 25X5/8 SAFETYGLIDE (NEEDLE) IMPLANT
NS IRRIG 1000ML POUR BTL (IV SOLUTION) ×3 IMPLANT
PACK C SECTION WH (CUSTOM PROCEDURE TRAY) ×3 IMPLANT
PAD ABD 8X7 1/2 STERILE (GAUZE/BANDAGES/DRESSINGS) ×3 IMPLANT
PAD OB MATERNITY 4.3X12.25 (PERSONAL CARE ITEMS) ×3 IMPLANT
PENCIL SMOKE EVAC W/HOLSTER (ELECTROSURGICAL) ×3 IMPLANT
RTRCTR C-SECT PINK 25CM LRG (MISCELLANEOUS) IMPLANT
SEPRAFILM MEMBRANE 5X6 (MISCELLANEOUS) IMPLANT
SPONGE LAP 18X18 RF (DISPOSABLE) ×9 IMPLANT
STRIP CLOSURE SKIN 1/4X4 (GAUZE/BANDAGES/DRESSINGS) ×2 IMPLANT
SUT PLAIN 0 NONE (SUTURE) IMPLANT
SUT VIC AB 0 CT1 36 (SUTURE) ×12 IMPLANT
SUT VIC AB 4-0 KS 27 (SUTURE) ×3 IMPLANT
TOWEL OR 17X24 6PK STRL BLUE (TOWEL DISPOSABLE) ×3 IMPLANT
TRAY FOLEY W/BAG SLVR 14FR LF (SET/KITS/TRAYS/PACK) ×3 IMPLANT

## 2018-04-05 NOTE — Lactation Note (Addendum)
This note was copied from a baby's chart. Lactation Consultation Note  Patient Name: Laura [name] [name] Date: 04/05/2018 Reason for consult: Initial assessment;Term P3, 9 hours female infant, LGA with c/s delivery. Mom feels breastfeeding is going well and infant latched 3 times since delivery. Per mom, she feels more confident and her goal is to breastfeed infant longer, she breastfeed her ist son only 6 days and 2nd child for 6 months. Per parents, infant had 2 voids and 3 stools. Per mom, she has a medela DEBP at home.  Mom latched infant on her  right breast using the cross cradle hold, infant mouth wide and flanged out, swallows observed by LC. Mom breastfeeding 15 minutes when LC left room and was still breastfeeding infant. Mom knows to breastfeed  according hunger cues, 8 to 12 times within 24 hours and not exceed 3 hours without breastfeeding infant. LC discussed I & O. Reviewed Baby & Me book's Breastfeeding Basics.  Mom knows to call Nurse or LC if she has any further questions or concerns. Mom made aware of O/P services, breastfeeding support groups, community resources, and our phone # for post-discharge questions.  Maternal Data Formula Feeding for Exclusion: No Has patient been taught Hand Expression?: Yes(colostrum present.) Does the patient have breastfeeding experience prior to this delivery?: Yes  Feeding Feeding Type: Breast Fed  LATCH Score Latch: Grasps breast easily, tongue down, lips flanged, rhythmical sucking.  Audible Swallowing: Spontaneous and intermittent  Type of Nipple: Everted at rest and after stimulation  Comfort (Breast/Nipple): Soft / non-tender  Hold (Positioning): Assistance needed to correctly position infant at breast and maintain latch.  LATCH Score: 9  Interventions Interventions: Breast feeding basics reviewed;Assisted with latch;Skin to skin;Breast massage;Hand express;Support pillows;Adjust position;Breast  compression;Position options;Expressed milk  Lactation Tools Discussed/Used     Consult Status Consult Status: Follow-up Date: 04/06/18 Follow-up type: In-patient    Danelle Earthly 04/05/2018, 9:10 PM

## 2018-04-05 NOTE — H&P (Signed)
Laura [name] is a 23 y.o. female [id number] at 25 weeks presenting for scheduled repeat cesarean section. Patient reports feeling well. She denies regular contractions, vaginal bleeding or leakage of fluid. Patient with prenatal care at [facility] complicated by previous cesarean section due to breech presentation and currently suspected LGA fetus.  OB History    Gravida  6   Para  2   Term  2   Preterm  0   AB  3   Living  2     SAB  3   TAB  0   Ectopic  0   Multiple  0   Live Births  2          Past Medical History:  Diagnosis Date  . Anxiety   . Depression    since 12/18 from [facility], PPD  . Meningitis   . PTSD (post-traumatic stress disorder) 2019  . Spinal headache    Past Surgical History:  Procedure Laterality Date  . APPENDECTOMY    . CESAREAN SECTION N/A 12/30/2015   Procedure: CESAREAN SECTION;  Surgeon: Adam Phenix, MD;  Location: [facility] BIRTHING SUITES;  Service: Obstetrics;  Laterality: N/A;  . CHOLECYSTECTOMY    . spnal tap    . URETER SURGERY     Family History: family history includes Cancer in her paternal grandmother; Diabetes in her maternal uncle, paternal aunt, paternal grandmother, and paternal uncle; Luiz Blare' disease in her mother. Social History:  reports that she has quit smoking. She has quit using smokeless tobacco. She reports that she does not drink alcohol or use drugs.     Maternal Diabetes: No Genetic Screening: Normal Maternal Ultrasounds/Referrals: Normal Fetal Ultrasounds or other Referrals:  None Maternal Substance Abuse:  No Significant Maternal Medications:  None Significant Maternal Lab Results:  None Other Comments:  None  ROS  See pertinent in HPI History   Height 5\' 7"  (1.702 m), weight 98.9 kg, last menstrual period 12/19/2016, unknown if currently breastfeeding. Exam Physical Exam  GENERAL: Well-developed, well-nourished female in no acute distress.  LUNGS: Clear to auscultation bilaterally.  HEART: Regular  rate and rhythm. ABDOMEN: Soft, nontender, gravid PELVIC: Not indicated EXTREMITIES: No cyanosis, clubbing, or edema, 2+ distal pulses.  Prenatal labs: ABO, Rh: --/--/A POS (01/17 0930) Antibody: NEG (01/17 0930) Rubella: 3.36 (07/15 1013) RPR: Non Reactive (01/17 0930)  HBsAg: NON-REACTIVE (07/15 1013)  HIV: NON-REACTIVE (11/04 0942)  GBS:   negative  Assessment/Plan: 23 yo [id number] at 39 weeks here for scheduled elective cesarean section - Risks, benefits and alternatives were explained including but not limited to risks of bleeding, infection and damage to adjacent organs. Patient verbalized understanding and all questions were answered.   Laura [name] 04/05/2018, 10:49 AM

## 2018-04-05 NOTE — Op Note (Signed)
Laura [name] PROCEDURE DATE: 04/05/2018  PREOPERATIVE DIAGNOSIS: Intrauterine pregnancy at  [redacted]w[redacted]d weeks gestation;   POSTOPERATIVE DIAGNOSIS: The same  PROCEDURE:     Cesarean Section  SURGEON:  Dr. Catalina Antigua  ASSISTANT: none  INDICATIONS: Laura [name] is a 23 y.o. [id number] at [redacted]w[redacted]d scheduled for cesarean section secondary to .  The risks of cesarean section discussed with the patient included but were not limited to: bleeding which may require transfusion or reoperation; infection which may require antibiotics; injury to bowel, bladder, ureters or other surrounding organs; injury to the fetus; need for additional procedures including hysterectomy in the event of a life-threatening hemorrhage; placental abnormalities wth subsequent pregnancies, incisional problems, thromboembolic phenomenon and other postoperative/anesthesia complications. The patient concurred with the proposed plan, giving informed written consent for the procedure.    FINDINGS:  Viable female infant in cephalic presentation.  Apgars 8 and 9.  Clear amniotic fluid.  Intact placenta, three vessel cord.  Normal uterus, fallopian tubes and ovaries bilaterally.  ANESTHESIA:    Spinal INTRAVENOUS FLUIDS:3100 ml ESTIMATED BLOOD LOSS: 450 mL ml URINE OUTPUT:  100 ml SPECIMENS: Placenta sent to L&D COMPLICATIONS: None immediate  PROCEDURE IN DETAIL:  The patient received intravenous antibiotics and had sequential compression devices applied to her lower extremities while in the preoperative area.  She was then taken to the operating room where anesthesia was induced and was found to be adequate. A foley catheter was placed into her bladder and attached to Laura [name] gravity. She was then placed in a dorsal supine position with a leftward tilt, and prepped and draped in a sterile manner. After an adequate timeout was performed, a Pfannenstiel skin incision was made with scalpel and carried through to the underlying  layer of fascia. The fascia was incised in the midline and this incision was extended bilaterally using the Mayo scissors. Kocher clamps were applied to the superior aspect of the fascial incision and the underlying rectus muscles were dissected off bluntly. A similar process was carried out on the inferior aspect of the facial incision. The rectus muscles were separated in the midline bluntly and the peritoneum was entered bluntly. The Alexis self-retaining retractor was introduced into the abdominal cavity. Attention was turned to the lower uterine segment where a bladder flap was created, and a transverse hysterotomy was made with a scalpel and extended bilaterally bluntly. The infant was successfully delivered, and delayed cord clamping was performed for 1 minute. The cord was clamped and cut and infant was handed over to awaiting neonatology team. Uterine massage was then administered and the placenta delivered intact with three-vessel cord. The uterus was cleared of clot and debris.  The hysterotomy was closed with 0 Vicryl in a running locked fashion, and an imbricating layer was also placed with a 0 Vicryl. Overall, excellent hemostasis was noted. The pelvis copiously irrigated and cleared of all clot and debris. Hemostasis was confirmed on all surfaces.  The peritoneum and the muscles were reapproximated using 0 vicryl interrupted stitches. The fascia was then closed using 0 Vicryl in a running fashion.  The skin was closed in a subcuticular fashion using 3.0 Vicryl. The patient tolerated the procedure well. Sponge, lap, instrument and needle counts were correct x 2. She was taken to the recovery room in stable condition.    [name] ConstantMD  04/05/2018 12:21 PM

## 2018-04-05 NOTE — Anesthesia Postprocedure Evaluation (Signed)
Anesthesia Post Note  Patient: Laura [name]  Procedure(s) Performed: CESAREAN SECTION (N/A Abdomen)     Patient location during evaluation: PACU Anesthesia Type: Spinal Level of consciousness: oriented and awake and alert Pain management: pain level controlled Vital Signs Assessment: post-procedure vital signs reviewed and stable Respiratory status: spontaneous breathing, respiratory function stable and nonlabored ventilation Cardiovascular status: blood pressure returned to baseline and stable Postop Assessment: no headache, no backache, no apparent nausea or vomiting, spinal receding and patient able to bend at knees Anesthetic complications: no    Last Vitals:  Vitals:   04/05/18 1300 04/05/18 1315  BP: 117/71 111/73  Pulse: 68 87  Resp: 19 15  Temp:    SpO2: 93% 94%    Last Pain:  Vitals:   04/05/18 1230  TempSrc: Oral   Pain Goal:    LLE Motor Response: Purposeful movement (04/05/18 1300)   RLE Motor Response: Purposeful movement (04/05/18 1300)       Epidural/Spinal Function Cutaneous sensation: Normal sensation (04/05/18 1300), Patient able to flex knees: Yes (04/05/18 1300), Patient able to lift hips off bed: Yes (04/05/18 1300), Back pain beyond tenderness at insertion site: No (04/05/18 1300), Progressively worsening motor and/or sensory loss: No (04/05/18 1300), Bowel and/or bladder incontinence post epidural: No (04/05/18 1300)  Bret Vanessen A.

## 2018-04-05 NOTE — Transfer of Care (Signed)
Immediate Anesthesia Transfer of Care Note  Patient: Laura SheffieldRebecca [name]  Procedure(s) Performed: CESAREAN SECTION (N/A Abdomen)  Patient Location: PACU  Anesthesia Type:Spinal  Level of Consciousness: awake, alert  and oriented  Airway & Oxygen Therapy: Patient Spontanous Breathing  Post-op Assessment: Report given to RN and Post -op Vital signs reviewed and stable  Post vital signs: Reviewed  Last Vitals:  Vitals Value Taken Time  BP    Temp    Pulse 71 04/05/2018 12:32 PM  Resp 22 04/05/2018 12:32 PM  SpO2 100 % 04/05/2018 12:32 PM  Vitals shown include unvalidated device data.  Last Pain:  Vitals:   04/05/18 0956  TempSrc: Oral         Complications: No apparent anesthesia complications

## 2018-04-05 NOTE — Anesthesia Procedure Notes (Addendum)
Spinal  Patient location during procedure: OR Start time: 04/05/2018 11:05 AM End time: 04/05/2018 11:11 AM Staffing Anesthesiologist: Mal Amabile, MD Performed: anesthesiologist  Preanesthetic Checklist Completed: patient identified, site marked, surgical consent, pre-op evaluation, timeout performed, IV checked, risks and benefits discussed and monitors and equipment checked Spinal Block Patient position: sitting Prep: site prepped and draped and DuraPrep Patient monitoring: heart rate, cardiac monitor, continuous pulse ox and blood pressure Approach: midline Location: L3-4 Injection technique: single-shot Needle Needle type: Pencan  Needle gauge: 24 G Needle length: 9 cm Needle insertion depth: 7 cm Assessment Sensory level: T4 Additional Notes Patient tolerated procedure well. Attempt x 2. Adequate sensory level.

## 2018-04-05 NOTE — Anesthesia Preprocedure Evaluation (Signed)
Anesthesia Evaluation  Patient identified by MRN, date of birth, ID band Patient awake    Reviewed: Allergy & Precautions, NPO status , Patient's Chart, lab work & pertinent test results  History of Anesthesia Complications (+) PONV, POST - OP SPINAL HEADACHE and history of anesthetic complications  Airway Mallampati: II  TM Distance: >3 FB Neck ROM: Full    Dental no notable dental hx. 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   GI/Hepatic negative GI ROS, Neg liver ROS,   Endo/Other  Obesity  Renal/GU negative Renal ROS  negative genitourinary   Musculoskeletal negative musculoskeletal ROS (+)   Abdominal (+) + obese,   Peds  Hematology negative hematology ROS (+)   Anesthesia Other Findings   Reproductive/Obstetrics (+) Pregnancy                             Anesthesia Physical Anesthesia Plan  ASA: II  Anesthesia Plan: Spinal   Post-op Pain Management:    Induction:   PONV Risk Score and Plan: 4 or greater and Scopolamine patch - Pre-op, Ondansetron and Treatment may vary due to age or medical condition  Airway Management Planned: Natural Airway  Additional Equipment:   Intra-op Plan:   Post-operative Plan:   Informed Consent: I have reviewed the patients History and Physical, chart, labs and discussed the procedure including the risks, benefits and alternatives for the proposed anesthesia with the patient or authorized representative who has indicated his/her understanding and acceptance.     Dental advisory given  Plan Discussed with: CRNA and Surgeon  Anesthesia Plan Comments:          Anesthesia Quick Evaluation

## 2018-04-05 NOTE — Addendum Note (Signed)
Addendum  created 04/05/18 1450 by Mal Amabile, MD   Order list changed, Order sets accessed

## 2018-04-06 ENCOUNTER — Encounter (HOSPITAL_COMMUNITY): Payer: Self-pay | Admitting: *Deleted

## 2018-04-06 LAB — CBC
HEMATOCRIT: 27.3 % — AB (ref 36.0–46.0)
Hemoglobin: 9.3 g/dL — ABNORMAL LOW (ref 12.0–15.0)
MCH: 31 pg (ref 26.0–34.0)
MCHC: 34.1 g/dL (ref 30.0–36.0)
MCV: 91 fL (ref 80.0–100.0)
Platelets: 166 10*3/uL (ref 150–400)
RBC: 3 MIL/uL — ABNORMAL LOW (ref 3.87–5.11)
RDW: 12.8 % (ref 11.5–15.5)
WBC: 11.1 10*3/uL — ABNORMAL HIGH (ref 4.0–10.5)
nRBC: 0 % (ref 0.0–0.2)

## 2018-04-06 LAB — BIRTH TISSUE RECOVERY COLLECTION (PLACENTA DONATION)

## 2018-04-06 MED ORDER — FERROUS SULFATE 325 (65 FE) MG PO TABS
325.0000 mg | ORAL_TABLET | Freq: Two times a day (BID) | ORAL | Status: DC
  Administered 2018-04-06 – 2018-04-07 (×3): 325 mg via ORAL
  Filled 2018-04-06 (×3): qty 1

## 2018-04-06 NOTE — Addendum Note (Signed)
Addendum  created 04/06/18 0830 by Graciela Husbands, CRNA   Clinical Note Signed

## 2018-04-06 NOTE — Anesthesia Postprocedure Evaluation (Signed)
Anesthesia Post Note  Patient: Laura [name]  Procedure(s) Performed: CESAREAN SECTION (N/A Abdomen)     Patient location during evaluation: Mother Baby Anesthesia Type: Spinal Level of consciousness: awake and alert and oriented Pain management: satisfactory to patient Vital Signs Assessment: post-procedure vital signs reviewed and stable Respiratory status: respiratory function stable and spontaneous breathing Cardiovascular status: blood pressure returned to baseline Postop Assessment: no headache, no backache, spinal receding, patient able to bend at knees and adequate PO intake Anesthetic complications: no    Last Vitals:  Vitals:   04/05/18 2327 04/06/18 0345  BP: (!) 104/59 (!) 90/51  Pulse: (!) 59 (!) 57  Resp: 17 16  Temp: 37 C 37.3 C  SpO2: 97% 97%    Last Pain:  Vitals:   04/06/18 0345  TempSrc: Oral  PainSc: 3    Pain Goal:                   Jimel Myler

## 2018-04-06 NOTE — Progress Notes (Signed)
POSTPARTUM PROGRESS NOTE  POD #1  Subjective:  Laura [name] is a 23 y.o. [id number] s/p rLTCS at [redacted]w[redacted]d.  She reports she doing well. No acute events overnight.  She denies any problems with ambulating, voiding or po intake. Denies nausea or vomiting. She has not passed flatus, but feels gas in her abdomen. Pain is well controlled.  Lochia is appropriate.  Objective: Blood pressure (!) 90/51, pulse (!) 57, temperature 99.1 F (37.3 C), temperature source Oral, resp. rate 16, height 5\' 7"  (1.702 m), weight 98.9 kg, last menstrual period 12/19/2016, SpO2 97 %, unknown if currently breastfeeding.  Physical Exam:  General: alert, cooperative and no distress Chest: no respiratory distress Heart:regular rate, distal pulses intact Abdomen: soft, nontender, +BS Uterine Fundus: firm, appropriately tender DVT Evaluation: No calf swelling or tenderness Extremities: No LE edema Skin: warm, dry; incision clean/dry/intact w/ pressure dressing in place  Recent Labs    04/05/18 1413 04/06/18 0639  HGB 11.5* 9.3*  HCT 33.7* 27.3*    Assessment/Plan: Laura [name] is a 23 y.o. [id number] s/p rLTCS at [redacted]w[redacted]d.  POD#1 - Doing welll; pain well controlled. H/H appropriate  Routine postpartum care  OOB, ambulated  Lovenox for VTE prophylaxis Anemia: asymptomatic  Start po ferrous sulfate BID Contraception: IUD Feeding: Breast  Dispo: Plan for discharge POD#2 or #3.   LOS: 1 day   Marcy Siren, D.O. OB Fellow  04/06/2018, 8:35 AM

## 2018-04-06 NOTE — Lactation Note (Signed)
This note was copied from a baby's chart. Lactation Consultation Note  Patient Name: Laura [name] [id number] Date: 04/06/2018 Reason for consult: Follow-up assessment  Mom describes slight discomfort with latch.  Infant has fed for 15 minutes on right side when LC walked into room.  Mom hunched over infant while feeding.  LC offered more pillow support, rolled blankets and suggested latching infant in football hold on other side.  Mom easily hand expressed and latched infant.  Rhythmic sucking followed by swallows seen and heard shortly after latch achieved.  Mom was excited to hear the swallows and said she was much more comfortable with the baby up closer to her and with the pillows under her arms.  Infant fed for 25 minutes then fell asleep with arms relaxed.  Mom told LC her "milk came in early" with her previous child.  Mom already feels fuller in her breast than when she delivered.    Hand pump was given to mom. Demonstrated use and discussed cleaning of parts.  Mom is aware of bf support groups and op services.  Questions were answered about pumping before going back to work at 7 weeks postpartum.   Maternal Data    Feeding Feeding Type: Breast Fed  LATCH Score Latch: Repeated attempts needed to sustain latch, nipple held in mouth throughout feeding, stimulation needed to elicit sucking reflex.  Audible Swallowing: Spontaneous and intermittent  Type of Nipple: Everted at rest and after stimulation  Comfort (Breast/Nipple): Filling, red/small blisters or bruises, mild/mod discomfort  Hold (Positioning): Assistance needed to correctly position infant at breast and maintain latch.  LATCH Score: 7  Interventions Interventions: Breast feeding basics reviewed;Assisted with latch;Skin to skin;Support pillows;Adjust position;Breast compression;Hand pump;Hand express;Breast massage  Lactation Tools Discussed/Used     Consult Status Consult Status: Follow-up Date:  04/07/18 Follow-up type: In-patient    Maryruth Hancock [facility] 04/06/2018, 8:26 PM

## 2018-04-06 NOTE — Progress Notes (Signed)
CSW received consult for hx Depression and PPD.  CSW met with MOB to offer support and complete assessment.    When CSW arrived was resting in bed in FOB was laying on the couch engaging in skin to skin with infant. CSW explained CSW's role and MOB gave CSW permission to complete the assessment while FOB was present.  FOB was appeared to be supportive of MOB and engaged with CSW. MOB was polite, easy to engage, and was forthcoming.  CSW asked about MOB's MH hx MOB openly shared her most recent hospitalization. MOB communicated emotionally feeling prepared after her inpatient stay and communicated she has been coping well. MOB is currently being treated with Celexa and reported that the medication is managing her symptoms.   CSW provided education regarding the baby blues period vs. perinatal mood disorders, discussed treatment and gave resources for mental health follow up if concerns arise.  CSW recommends self-evaluation during the postpartum time period using the New Mom Checklist from Postpartum Progress and encouraged MOB to contact a medical professional if symptoms are noted at any time. MOB presented with insight and awareness and reported having a good support team.  CSW assessed for safety and MOB denied SI and HI. MOB expressed feeling comfortable seeking help if help is warranted.   CSW identifies no further need for intervention and no barriers to discharge at this time.  Laura [name], MSW, LCSW Clinical Social Work [phone] 

## 2018-04-07 MED ORDER — SENNOSIDES-DOCUSATE SODIUM 8.6-50 MG PO TABS: 2.0000 | ORAL_TABLET | ORAL | 0 refills | Status: DC

## 2018-04-07 MED ORDER — IBUPROFEN 800 MG PO TABS: 800.0000 mg | ORAL_TABLET | Freq: Three times a day (TID) | ORAL | 0 refills | Status: DC

## 2018-04-07 MED ORDER — OXYCODONE HCL 5 MG PO TABS: 5.0000 mg | ORAL_TABLET | Freq: Four times a day (QID) | ORAL | 0 refills | Status: AC | PRN

## 2018-04-07 MED ORDER — FERROUS SULFATE 325 (65 FE) MG PO TABS: 325.0000 mg | ORAL_TABLET | ORAL | 0 refills | Status: DC

## 2018-04-07 NOTE — Discharge Summary (Addendum)
OB Discharge Summary    Patient Name: Laura [name] DOB: 1995/07/17 MRN: [id number]  Date of admission: 04/05/2018 Delivering MD: Catalina Antigua   Date of discharge: 04/07/2018  Admitting diagnosis: RCS Intrauterine pregnancy: [redacted]w[redacted]d     Secondary diagnosis:  Active Problems:   S/P cesarean section  Additional problems: None Discharge diagnosis: Term Pregnancy Delivered                                                    Post partum procedures:None Complications: None  Hospital course:  Sceduled C/S   23 y.o. yo [id number] at [redacted]w[redacted]d was admitted to the hospital 04/05/2018 for scheduled cesarean section with the following indication:Elective Repeat. Membrane Rupture Time/Date: 11:34 AM ,04/05/2018  Patient delivered a Viable infant.04/05/2018. Details of operation can be found in separate operative note, QBL 450 and H/H trended from 11.5 to 9.3.  Patient had an uncomplicated postpartum course, and her pain is well-controlled.  She is ambulating, tolerating a regular diet, passing flatus, and urinating well. Patient is discharged home in stable condition on  04/07/18        Physical exam  Vitals:   04/06/18 0345 04/06/18 1533 04/06/18 2215 04/07/18 0601  BP: (!) 90/51 (!) 102/55 99/62 93/62   Pulse: (!) 57 67 72 67  Resp: 16 18 18 18   Temp: 99.1 F (37.3 C) 98.2 F (36.8 C) 98.6 F (37 C) 98.2 F (36.8 C)  TempSrc: Oral Oral Oral Oral  SpO2: 97% 98%  98%  Weight:      Height:       General: alert, cooperative and no distress Lochia: appropriate Uterine Fundus: firm Incision: Healing well with no significant drainage, No significant erythema, minimal drainage DVT Evaluation: No significant calf/ankle edema.  Labs: Lab Results  Component Value Date   WBC 11.1 (H) 04/06/2018   HGB 9.3 (L) 04/06/2018   HCT 27.3 (L) 04/06/2018   MCV 91.0 04/06/2018   PLT 166 04/06/2018   CMP Latest Ref Rng & Units 04/05/2018  Glucose 70 - 99 mg/dL -  BUN 6 - 20 mg/dL -  Creatinine 6.55 -  3.74 mg/dL 8.27(M)  Sodium 786 - 754 mmol/L -  Potassium 3.5 - 5.1 mmol/L -  Chloride 98 - 111 mmol/L -  CO2 22 - 32 mmol/L -  Calcium 8.9 - 10.3 mg/dL -  Total Protein 6.5 - 8.1 g/dL -  Total Bilirubin 0.3 - 1.2 mg/dL -  Alkaline Phos 38 - 492 U/L -  AST 15 - 41 U/L -  ALT 0 - 44 U/L -    Discharge instruction: per After Visit Summary and "Baby and Me Booklet".  After visit meds:  Allergies as of 04/07/2018      Reactions   Divalproex Sodium Other (See Comments)   Reaction:  Memory loss    Demerol [meperidine] Hives      Medication List    STOP taking these medications   aspirin 81 MG chewable tablet   citalopram 20 MG tablet Commonly known as:  CELEXA   COMFORT FIT MATERNITY SUPP MED Misc   multivitamin-prenatal 27-0.8 MG Tabs tablet     TAKE these medications   ferrous sulfate 325 (65 FE) MG tablet Take 1 tablet (325 mg total) by mouth every other day for 30 days.   ibuprofen 800 MG tablet Commonly known as:  ADVIL,MOTRIN Take 1 tablet (800 mg total) by mouth every 8 (eight) hours.   oxyCODONE 5 MG immediate release tablet Commonly known as:  Oxy IR/ROXICODONE Take 1 tablet (5 mg total) by mouth every 6 (six) hours as needed for up to 3 days for severe pain.   senna-docusate 8.6-50 MG tablet Commonly known as:  Senokot-S Take 2 tablets by mouth daily. Start taking on:  April 08, 2018       Diet: routine diet Activity: Advance as tolerated. Pelvic rest for 6 weeks.   Outpatient follow up:1 week Follow up Appt: Future Appointments  Date Time Provider Department Center  04/18/2018  9:30 AM Rolm Bookbinder, CNM [facility]  05/01/2018  2:00 PM Allie Bossier, MD [facility]  05/02/2018  9:10 AM Drema Dallas, DO [facility] None   Postpartum contraception: IUD Mirena  Newborn Data: Live born female  Birth Weight: 9 lb 8.4 oz (4321 g) APGAR: 8, 9  Newborn Delivery   Birth date/time:  04/05/2018 11:37:00 Delivery type:  C-Section,  Vacuum Assisted Trial of labor:  No C-section categorization:  Repeat     Baby Feeding: Breast Disposition:home with mother  Peggyann Shoals, DO PGY-1  Attestation: I have seen this patient and agree with the resident's documentation. I have examined them separately, and we have discussed the plan of care.  Cristal Deer. Earlene Plater, DO OB/GYN Fellow

## 2018-04-18 ENCOUNTER — Ambulatory Visit: Payer: Medicaid Other

## 2018-04-30 NOTE — Progress Notes (Deleted)
NEUROLOGY CONSULTATION NOTE  Laura SheffieldRebecca Tsui MRN: [id number] DOB: 1995-09-06  Referring provider: Charna ElizabethLisa Lefwich-Kirby, CNM Primary care provider: Venia Minksonnay Elkins, NP  Reason for consult:  headache  HISTORY OF PRESENT ILLNESS: Laura SheffieldRebecca Matas is a [age] year old***- handed Caucasian woman who presents for headaches.  History supplemented by referring provider note.  Onset:  *** Location:  *** Quality:  *** Intensity:  ***.  *** denies new headache, thunderclap headache or severe headache that wakes *** from sleep. Aura:  *** Premonitory Phase:  *** Postdrome:  *** Associated symptoms:  ***.  *** denies associated unilateral numbness or weakness. Duration:  *** Frequency:  *** Frequency of abortive medication: *** Triggers:  *** Exacerbating factors:  *** Relieving factors:  *** Activity:  ***  Current NSAIDS:  *** Current analgesics:  *** Current triptans:  *** Current ergotamine:  *** Current anti-emetic:  *** Current muscle relaxants:  *** Current anti-anxiolytic:  *** Current sleep aide:  *** Current Antihypertensive medications:  *** Current Antidepressant medications:  *** Current Anticonvulsant medications:  *** Current anti-CGRP:  *** Current Vitamins/Herbal/Supplements:  *** Current Antihistamines/Decongestants:  *** Other therapy:  *** Hormone/birth control:  *** Other medications:  ***  Past NSAIDS:  *** Past analgesics:  *** Past abortive triptans:  *** Past abortive ergotamine:  *** Past muscle relaxants:  *** Past anti-emetic:  *** Past antihypertensive medications:  *** Past antidepressant medications:  *** Past anticonvulsant medications:  *** Past anti-CGRP:  *** Past vitamins/Herbal/Supplements:  *** Past antihistamines/decongestants:  *** Other past therapies:  ***  Caffeine:  *** Alcohol:  *** Smoker:  *** Diet:  *** Exercise:  *** Depression:  ***; Anxiety:  *** Other pain:  *** Sleep hygiene:  *** Family history of  headache:  ***  CMP from 02/09/2018 was unremarkable.   PAST MEDICAL HISTORY: Past Medical History:  Diagnosis Date  . Anxiety   . Depression    since 12/18 from [facility], PPD  . Meningitis   . PTSD (post-traumatic stress disorder) 2019  . Spinal headache     PAST SURGICAL HISTORY: Past Surgical History:  Procedure Laterality Date  . APPENDECTOMY    . CESAREAN SECTION N/A 12/30/2015   Procedure: CESAREAN SECTION;  Surgeon:  PhenixJames G Arnold, MD;  [facility];  Service: Obstetrics;  Laterality: N/A;  . CESAREAN SECTION N/A 04/05/2018   Procedure: CESAREAN SECTION;  Surgeon: Catalina Antiguaonstant, Peggy, MD;  Location: [facility];  Service: Obstetrics;  Laterality: N/A;  . CHOLECYSTECTOMY    . spnal tap    . URETER SURGERY      MEDICATIONS: Current Outpatient Medications on File Prior to Visit  Medication Sig Dispense Refill  . ferrous sulfate 325 (65 FE) MG tablet Take 1 tablet (325 mg total) by mouth every other day for 30 days. 15 tablet 0  . ibuprofen (ADVIL,MOTRIN) 800 MG tablet Take 1 tablet (800 mg total) by mouth every 8 (eight) hours. 30 tablet 0  . senna-docusate (SENOKOT-S) 8.6-50 MG tablet Take 2 tablets by mouth daily. 60 tablet 0   No current facility-administered medications on file prior to visit.     ALLERGIES: Allergies  Allergen Reactions  . Divalproex Sodium Other (See Comments)    Reaction:  Memory loss   . Demerol [Meperidine] Hives    FAMILY HISTORY: Family History  Problem Relation Age of Onset  . Diabetes Maternal Uncle   . Diabetes Paternal Aunt   . Diabetes Paternal Uncle   . Cancer Paternal Grandmother   . Diabetes Paternal Grandmother   .  Graves' disease Mother    ***.  SOCIAL HISTORY: Social History   Socioeconomic History  . Marital status: Single    Spouse name: Not on file  . Number of children: Not on file  . Years of education: Not on file  . Highest education level: Not on file  Occupational History  . Not on  file  Social Needs  . Financial resource strain: Not hard at all  . Food insecurity:    Worry: Never true    Inability: Never true  . Transportation needs:    Medical: No    Non-medical: Not on file  Tobacco Use  . Smoking status: Former Games developer  . Smokeless tobacco: Former Engineer, water and Sexual Activity  . Alcohol use: No  . Drug use: No  . Sexual activity: Yes    Birth control/protection: None  Lifestyle  . Physical activity:    Days per week: Not on file    Minutes per session: Not on file  . Stress: Only a little  Relationships  . Social connections:    Talks on phone: Not on file    Gets together: Not on file    Attends religious service: Not on file    Active member of club or organization: Not on file    Attends meetings of clubs or organizations: Not on file    Relationship status: Not on file  . Intimate partner violence:    Fear of current or ex partner: No    Emotionally abused: No    Physically abused: No    Forced sexual activity: No  Other Topics Concern  . Not on file  Social History Narrative  . Not on file    REVIEW OF SYSTEMS: Constitutional: No fevers, chills, or sweats, no generalized fatigue, change in appetite Eyes: No visual changes, double vision, eye pain Ear, nose and throat: No hearing loss, ear pain, nasal congestion, sore throat Cardiovascular: No chest pain, palpitations Respiratory:  No shortness of breath at rest or with exertion, wheezes GastrointestinaI: No nausea, vomiting, diarrhea, abdominal pain, fecal incontinence Genitourinary:  No dysuria, urinary retention or frequency Musculoskeletal:  No neck pain, back pain Integumentary: No rash, pruritus, skin lesions Neurological: as above Psychiatric: No depression, insomnia, anxiety Endocrine: No palpitations, fatigue, diaphoresis, mood swings, change in appetite, change in weight, increased thirst Hematologic/Lymphatic:  No purpura, petechiae. Allergic/Immunologic: no  itchy/runny eyes, nasal congestion, recent allergic reactions, rashes  PHYSICAL EXAM: *** General: No acute distress.  Patient appears ***-groomed.  *** Head:  Normocephalic/atraumatic Eyes:  fundi examined but not visualized Neck: supple, no paraspinal tenderness, full range of motion Back: No paraspinal tenderness Heart: regular rate and rhythm Lungs: Clear to auscultation bilaterally. Vascular: No carotid bruits. Neurological Exam: Mental status: alert and oriented to person, place, and time, recent and remote memory intact, fund of knowledge intact, attention and concentration intact, speech fluent and not dysarthric, language intact. Cranial nerves: CN I: not tested CN II: pupils equal, round and reactive to light, visual fields intact CN III, IV, VI:  full range of motion, no nystagmus, no ptosis CN V: facial sensation intact CN VII: upper and lower face symmetric CN VIII: hearing intact CN IX, X: gag intact, uvula midline CN XI: sternocleidomastoid and trapezius muscles intact CN XII: tongue midline Bulk & Tone: normal, no fasciculations. Motor:  5/5 throughout *** Sensation:  Pinprick *** temperature *** and vibration sensation intact.  ***. Deep Tendon Reflexes:  2+ throughout, *** toes downgoing.  ***  Finger to nose testing:  Without dysmetria.  *** Heel to shin:  Without dysmetria.  *** Gait:  Normal station and stride.  Able to turn and tandem walk. Romberg ***.  IMPRESSION: ***  PLAN: ***  Thank you for allowing me to take part in the care of this patient.  Metta Clines, DO  CC: ***

## 2018-05-01 ENCOUNTER — Other Ambulatory Visit (HOSPITAL_COMMUNITY)
Admission: RE | Admit: 2018-05-01 | Discharge: 2018-05-01 | Disposition: A | Discharge status: Home / Self Care | Payer: Medicaid Other | Source: Ambulatory Visit | Attending: Obstetrics & Gynecology | Admitting: Obstetrics & Gynecology

## 2018-05-01 ENCOUNTER — Encounter: Payer: Self-pay | Admitting: Obstetrics & Gynecology

## 2018-05-01 ENCOUNTER — Ambulatory Visit (INDEPENDENT_AMBULATORY_CARE_PROVIDER_SITE_OTHER): Payer: Medicaid Other | Admitting: Obstetrics & Gynecology

## 2018-05-01 VITALS — BP 113/73 | HR 82 | Resp 16 | Ht 67.0 in | Wt 196.0 lb

## 2018-05-01 DIAGNOSIS — Z3202 Encounter for pregnancy test, result negative: Secondary | ICD-10-CM

## 2018-05-01 DIAGNOSIS — Z1389 Encounter for screening for other disorder: Secondary | ICD-10-CM | POA: Diagnosis not present

## 2018-05-01 DIAGNOSIS — Z30430 Encounter for insertion of intrauterine contraceptive device: Secondary | ICD-10-CM

## 2018-05-01 DIAGNOSIS — R87612 Low grade squamous intraepithelial lesion on cytologic smear of cervix (LGSIL): Secondary | ICD-10-CM | POA: Diagnosis not present

## 2018-05-01 LAB — POCT URINE PREGNANCY: Preg Test, Ur: NEGATIVE

## 2018-05-01 MED ORDER — LEVONORGESTREL 19.5 MCG/DAY IU IUD
INTRAUTERINE_SYSTEM | Freq: Once | INTRAUTERINE | Status: AC
  Administered 2018-05-01: 14:00:00 via INTRAUTERINE

## 2018-05-01 NOTE — Progress Notes (Signed)
Post Partum Exam  Laura [name] is a 23 y.o. [phone] female who presents for a postpartum visit. She is 4 weeks postpartum following a low cervical transverse Cesarean section. I have fully reviewed the prenatal and intrapartum course. The delivery was at 39 gestational weeks.  Anesthesia: epidural. Postpartum course has been unremarkable. Baby's course has been unremarkable. Baby is feeding by breast. Bleeding no bleeding. Bowel function is normal. Bladder function is normal. Patient is sexually active. Contraception method is IUD. Postpartum depression screening:neg  The following portions of the patient's history were reviewed and updated as appropriate: allergies, current medications, past family history, past medical history, past social history, past surgical history and problem list. Last pap smear done 2019 and was Abnormal- LGSIL  Review of Systems Pertinent items are noted in HPI.    Objective:  Last menstrual period 12/19/2016, unknown if currently breastfeeding.  General:  alert   Breasts:  inspection negative, no nipple discharge or bleeding, no masses or nodularity palpable  Lungs: clear to auscultation bilaterally  Heart:  regular rate and rhythm, S1, S2 normal, no murmur, click, rub or gallop  Abdomen: soft, non-tender; bowel sounds normal; no masses,  no organomegaly   Vulva:  normal  Vagina: normal vagina  Cervix:  anteverted  Corpus: normal size, contour, position, consistency, mobility, non-tender  Adnexa:  not evaluated  Rectal Exam: Not performed.         UPT negative, consent signed, Time out procedure done. Cervix prepped with betadine and grasped with a single tooth tenaculum on the posterior lip due to the retroverted status of her uterus. Liletta was easily placed and the strings were cut to 3-4 cm. Uterus sounded to 9 cm. She tolerated the procedure well.   Assessment:    Normal postpartum exam. Pap smear done at today's visit.   Plan:   1.  Contraception: IUD 2. Rec abstinence for another 2 weeks 3. Follow up in: 4 weeks for string check or as needed.

## 2018-05-02 ENCOUNTER — Ambulatory Visit: Payer: Self-pay | Admitting: Neurology

## 2018-05-02 ENCOUNTER — Encounter

## 2018-05-05 LAB — CYTOLOGY - PAP
Diagnosis: NEGATIVE
Diagnosis: REACTIVE

## 2018-05-27 ENCOUNTER — Ambulatory Visit: Payer: Self-pay | Admitting: Advanced Practice Midwife

## 2018-06-05 ENCOUNTER — Ambulatory Visit: Payer: Self-pay | Admitting: Obstetrics & Gynecology

## 2018-07-05 ENCOUNTER — Encounter (HOSPITAL_COMMUNITY): Payer: Self-pay

## 2018-08-06 ENCOUNTER — Other Ambulatory Visit: Payer: Self-pay

## 2018-08-06 DIAGNOSIS — F53 Postpartum depression: Secondary | ICD-10-CM

## 2018-08-06 NOTE — Progress Notes (Signed)
Pt called needing referral to behavioral health due to depression issues

## 2018-08-26 ENCOUNTER — Ambulatory Visit (INDEPENDENT_AMBULATORY_CARE_PROVIDER_SITE_OTHER): Payer: Medicaid Other | Admitting: Psychiatry

## 2018-08-26 ENCOUNTER — Encounter (HOSPITAL_COMMUNITY): Payer: Self-pay | Admitting: Psychiatry

## 2018-08-26 DIAGNOSIS — F4310 Post-traumatic stress disorder, unspecified: Secondary | ICD-10-CM

## 2018-08-26 DIAGNOSIS — F411 Generalized anxiety disorder: Secondary | ICD-10-CM

## 2018-08-26 DIAGNOSIS — F331 Major depressive disorder, recurrent, moderate: Secondary | ICD-10-CM | POA: Diagnosis not present

## 2018-08-26 DIAGNOSIS — F53 Postpartum depression: Secondary | ICD-10-CM

## 2018-08-26 DIAGNOSIS — O99345 Other mental disorders complicating the puerperium: Secondary | ICD-10-CM

## 2018-08-26 MED ORDER — BUSPIRONE HCL 7.5 MG PO TABS: 7.5000 mg | ORAL_TABLET | Freq: Every day | ORAL | 1 refills | Status: DC

## 2018-08-26 MED ORDER — CITALOPRAM HYDROBROMIDE 10 MG PO TABS: 10.0000 mg | ORAL_TABLET | Freq: Every day | ORAL | 1 refills | Status: DC
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
## 2018-09-08 ENCOUNTER — Ambulatory Visit (HOSPITAL_COMMUNITY): Payer: Medicaid Other | Admitting: Licensed Clinical Social Worker

## 2018-09-09 DIAGNOSIS — G43909 Migraine, unspecified, not intractable, without status migrainosus: Secondary | ICD-10-CM | POA: Diagnosis not present

## 2018-09-09 DIAGNOSIS — R110 Nausea: Secondary | ICD-10-CM | POA: Diagnosis not present

## 2018-09-11 ENCOUNTER — Ambulatory Visit: Payer: Medicaid Other | Admitting: Obstetrics & Gynecology

## 2018-09-25 ENCOUNTER — Ambulatory Visit (INDEPENDENT_AMBULATORY_CARE_PROVIDER_SITE_OTHER): Payer: Medicaid Other | Admitting: Psychiatry

## 2018-09-25 ENCOUNTER — Encounter (HOSPITAL_COMMUNITY): Payer: Self-pay | Admitting: Psychiatry

## 2018-09-25 DIAGNOSIS — F4310 Post-traumatic stress disorder, unspecified: Secondary | ICD-10-CM | POA: Diagnosis not present

## 2018-09-25 DIAGNOSIS — F53 Postpartum depression: Secondary | ICD-10-CM

## 2018-09-25 DIAGNOSIS — F331 Major depressive disorder, recurrent, moderate: Secondary | ICD-10-CM | POA: Diagnosis not present

## 2018-09-25 DIAGNOSIS — F411 Generalized anxiety disorder: Secondary | ICD-10-CM | POA: Diagnosis not present

## 2018-09-25 MED ORDER — CITALOPRAM HYDROBROMIDE 20 MG PO TABS: 20.0000 mg | ORAL_TABLET | Freq: Every day | ORAL | 1 refills | Status: DC
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
## 2018-10-13 ENCOUNTER — Ambulatory Visit (HOSPITAL_COMMUNITY): Payer: Medicaid Other | Admitting: Licensed Clinical Social Worker

## 2018-10-16 DIAGNOSIS — R51 Headache: Secondary | ICD-10-CM | POA: Diagnosis not present

## 2018-10-16 DIAGNOSIS — E663 Overweight: Secondary | ICD-10-CM | POA: Diagnosis not present

## 2018-10-16 DIAGNOSIS — L700 Acne vulgaris: Secondary | ICD-10-CM | POA: Diagnosis not present

## 2018-10-16 DIAGNOSIS — G43909 Migraine, unspecified, not intractable, without status migrainosus: Secondary | ICD-10-CM | POA: Diagnosis not present

## 2018-10-20 ENCOUNTER — Ambulatory Visit (INDEPENDENT_AMBULATORY_CARE_PROVIDER_SITE_OTHER): Payer: Medicaid Other | Admitting: Licensed Clinical Social Worker

## 2018-10-20 ENCOUNTER — Other Ambulatory Visit: Payer: Self-pay

## 2018-10-20 DIAGNOSIS — F4310 Post-traumatic stress disorder, unspecified: Secondary | ICD-10-CM
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
## 2018-10-26 ENCOUNTER — Other Ambulatory Visit (HOSPITAL_COMMUNITY): Payer: Self-pay | Admitting: Psychiatry

## 2018-10-28 ENCOUNTER — Ambulatory Visit (INDEPENDENT_AMBULATORY_CARE_PROVIDER_SITE_OTHER): Payer: Medicaid Other | Admitting: Psychiatry

## 2018-10-28 ENCOUNTER — Encounter (HOSPITAL_COMMUNITY): Payer: Self-pay | Admitting: Psychiatry

## 2018-10-28 ENCOUNTER — Other Ambulatory Visit: Payer: Self-pay

## 2018-10-28 DIAGNOSIS — F411 Generalized anxiety disorder: Secondary | ICD-10-CM

## 2018-10-28 DIAGNOSIS — F4310 Post-traumatic stress disorder, unspecified: Secondary | ICD-10-CM

## 2018-10-28 DIAGNOSIS — O99345 Other mental disorders complicating the puerperium: Secondary | ICD-10-CM

## 2018-10-28 DIAGNOSIS — F53 Postpartum depression: Secondary | ICD-10-CM | POA: Diagnosis not present

## 2018-10-28 DIAGNOSIS — F331 Major depressive disorder, recurrent, moderate: Secondary | ICD-10-CM

## 2018-10-28 MED ORDER — CITALOPRAM HYDROBROMIDE 20 MG PO TABS: 20.0000 mg | ORAL_TABLET | Freq: Every day | ORAL | 1 refills | Status: DC

## 2018-10-28 MED ORDER — BUSPIRONE HCL 7.5 MG PO TABS: 7.5000 mg | ORAL_TABLET | Freq: Every day | ORAL | 1 refills | Status: DC
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
## 2018-10-29 ENCOUNTER — Telehealth: Payer: Self-pay | Admitting: *Deleted

## 2018-10-29 DIAGNOSIS — Z831 Family history of other infectious and parasitic diseases: Secondary | ICD-10-CM | POA: Diagnosis not present

## 2018-10-29 DIAGNOSIS — G43909 Migraine, unspecified, not intractable, without status migrainosus: Secondary | ICD-10-CM | POA: Diagnosis not present

## 2018-10-29 DIAGNOSIS — L02412 Cutaneous abscess of left axilla: Secondary | ICD-10-CM | POA: Diagnosis not present

## 2018-10-29 NOTE — Telephone Encounter (Signed)
Returned call from 11:06am. Left patient a message of how to find her TDAP in her [facility].

## 2018-11-14 DIAGNOSIS — Z0184 Encounter for antibody response examination: Secondary | ICD-10-CM | POA: Diagnosis not present

## 2018-11-14 DIAGNOSIS — Z020 Encounter for examination for admission to educational institution: Secondary | ICD-10-CM | POA: Diagnosis not present

## 2018-11-29 IMAGING — US US OB COMP LESS 14 WK
1 series · 15 of 28 positions shown · non-contrast
Comparison: None.

CLINICAL DATA: Cramping pain.

EXAM:
OBSTETRIC <14 WK US AND TRANSVAGINAL OB US
TECHNIQUE: Both transabdominal and transvaginal ultrasound examinations were
performed for complete evaluation of the gestation as well as the
maternal uterus, adnexal regions, and pelvic cul-de-sac.
Transvaginal technique was performed to assess early pregnancy.

[Series 1: us ob comp less 14 wk · 15 of 64 slices shown]
[im 1/64]
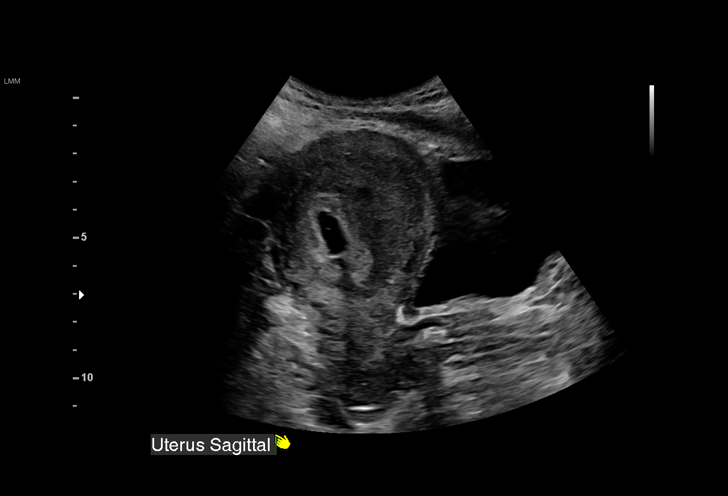
[im 5/64]
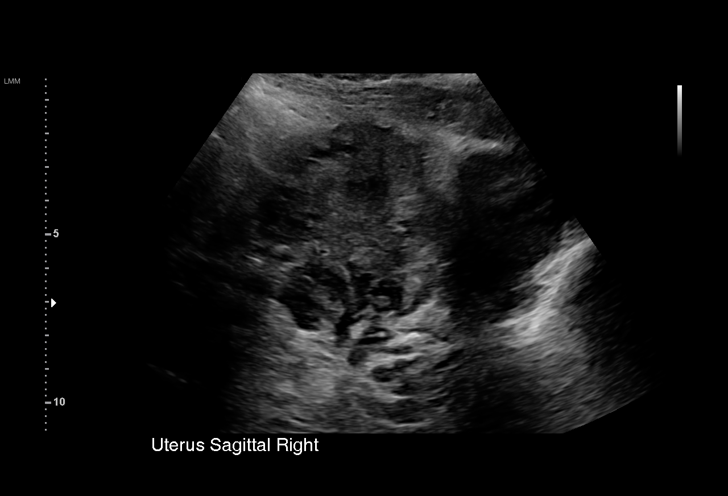
[im 10/64]
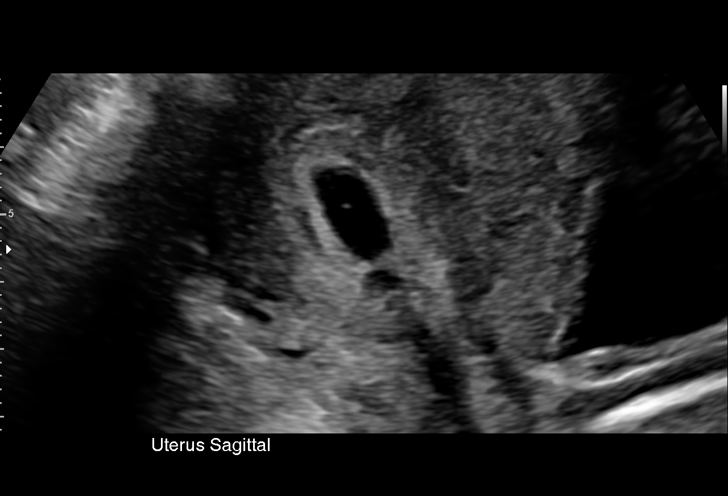
[im 15/64]
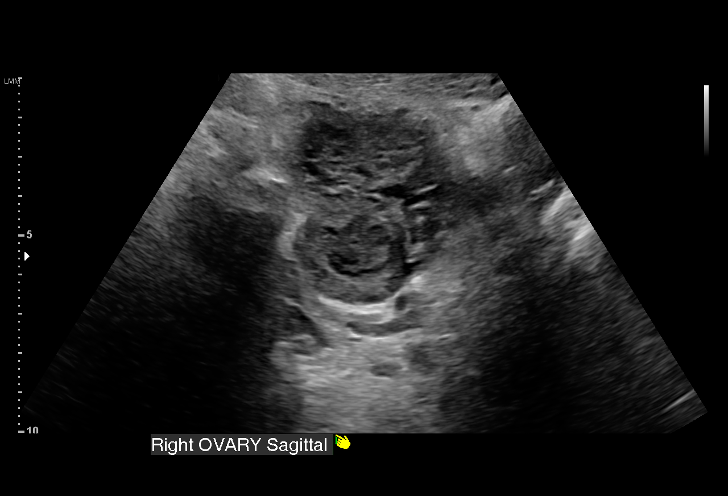
[im 19/64]
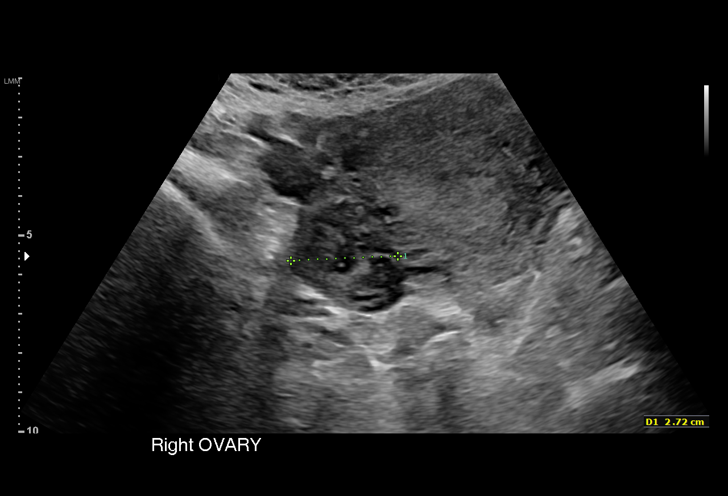
[im 24/64]
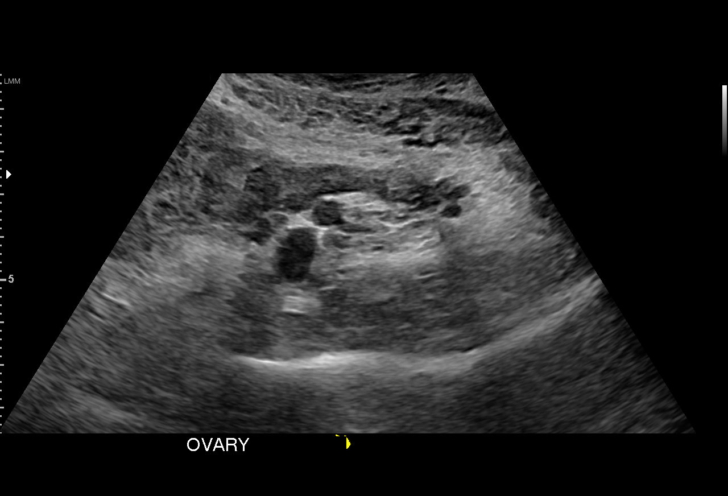
[im 29/64]
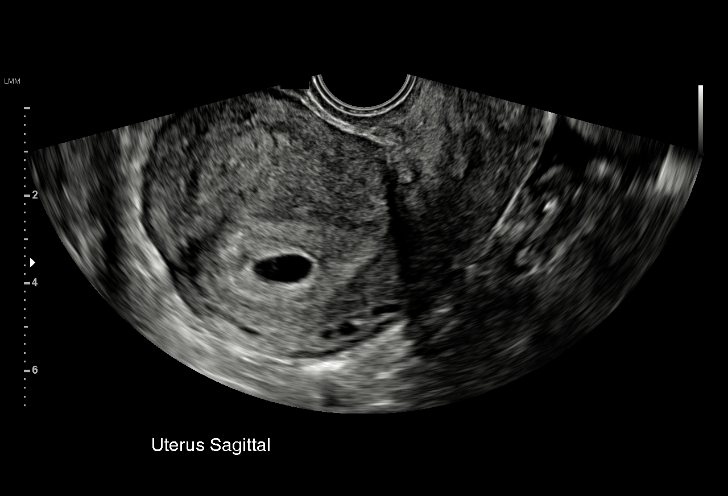
[im 33/64]
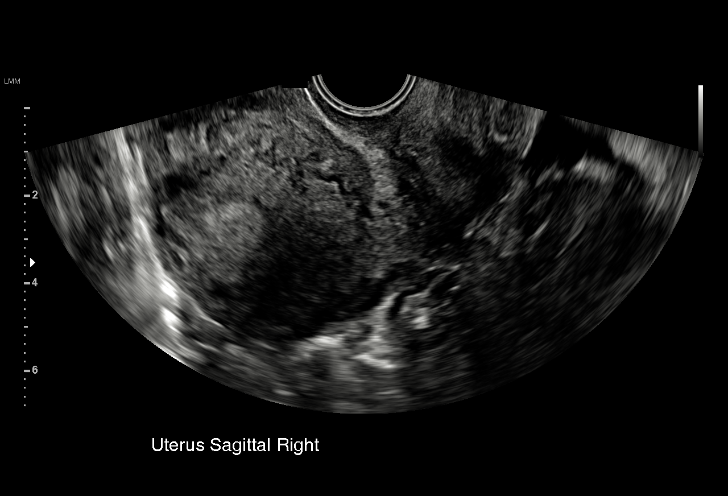
[im 36/64]
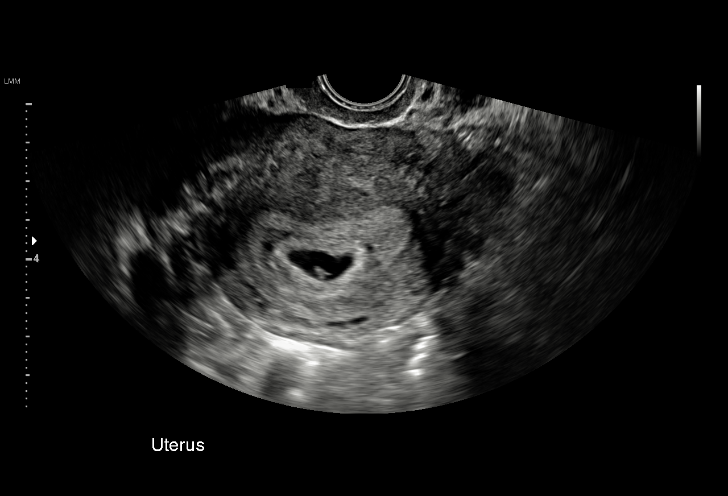
[im 40/64]
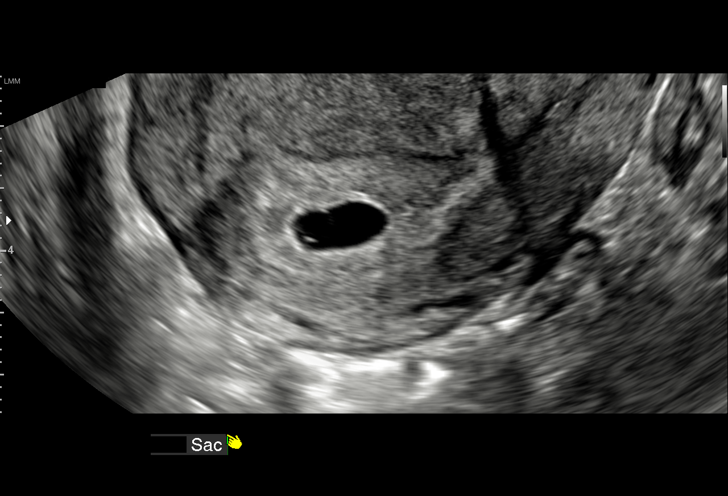
[im 45/64]
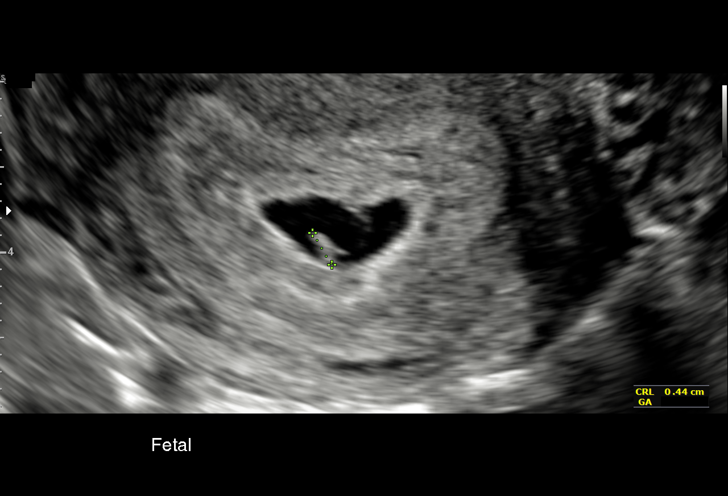
[im 50/64]
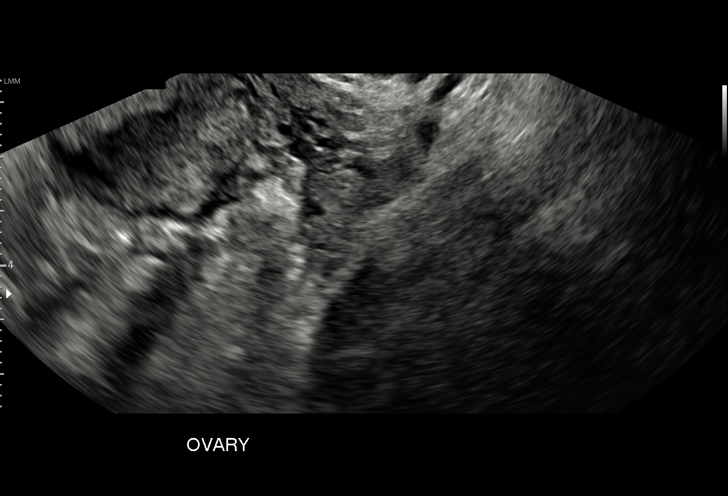
[im 54/64]
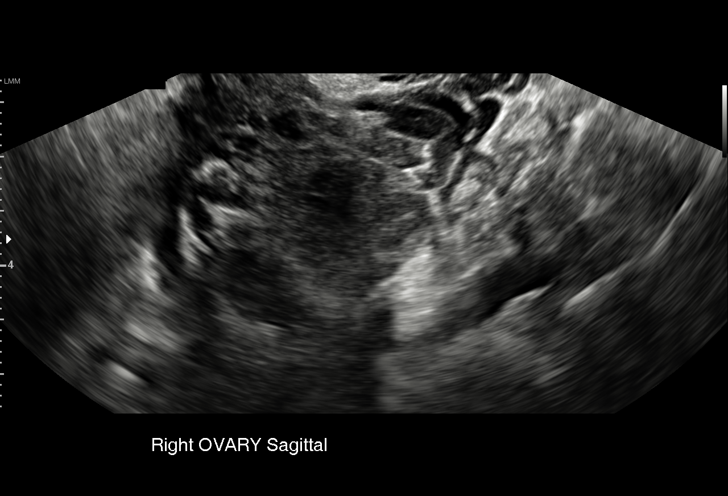
[im 59/64]
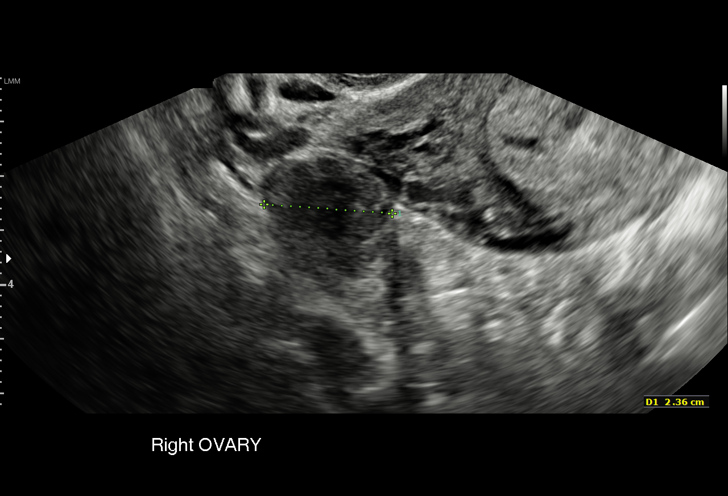
[im 64/64]
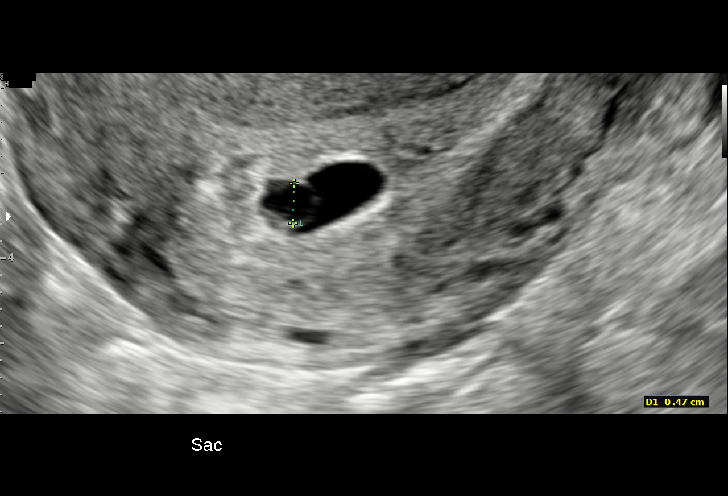

[15 of 28 positions shown; findings below may reference images not displayed]

FINDINGS: Intrauterine gestational sac: Single. Appropriate surrounding
decidual reaction.

Yolk sac:  Identified.

Embryo:  Identified

Cardiac Activity: Identified

Heart Rate: 123  bpm

MSD:   mm    w     d

CRL:  4  mm   6 w   0 d                  US EDC: 09/29/2017

Subchorionic hemorrhage:  None visualized.

Maternal uterus/adnexae: Maternal ovaries are unremarkable, probable
small corpus luteum within the right ovary. There is no mass or free
fluid identified in either adnexal region.
IMPRESSION: 1. Single live intrauterine pregnancy with estimated gestational age
of 6 weeks and 0 days. No subchorionic hemorrhage or other
complicating feature identified.
2. Maternal ovaries are unremarkable and there is no mass or free
fluid seen in either adnexal region.

## 2019-01-27 ENCOUNTER — Ambulatory Visit (HOSPITAL_COMMUNITY): Payer: Medicaid Other | Admitting: Psychiatry

## 2019-01-27 ENCOUNTER — Other Ambulatory Visit: Payer: Self-pay

## 2019-02-16 DIAGNOSIS — R0789 Other chest pain: Secondary | ICD-10-CM | POA: Diagnosis not present

## 2019-03-07 DIAGNOSIS — Z79899 Other long term (current) drug therapy: Secondary | ICD-10-CM | POA: Diagnosis not present

## 2019-03-07 DIAGNOSIS — R609 Edema, unspecified: Secondary | ICD-10-CM | POA: Diagnosis not present

## 2019-03-07 DIAGNOSIS — Z888 Allergy status to other drugs, medicaments and biological substances status: Secondary | ICD-10-CM | POA: Diagnosis not present

## 2019-03-07 DIAGNOSIS — Z7982 Long term (current) use of aspirin: Secondary | ICD-10-CM | POA: Diagnosis not present

## 2019-03-07 DIAGNOSIS — Z885 Allergy status to narcotic agent status: Secondary | ICD-10-CM | POA: Diagnosis not present

## 2019-03-07 DIAGNOSIS — Z87891 Personal history of nicotine dependence: Secondary | ICD-10-CM | POA: Diagnosis not present

## 2019-03-07 DIAGNOSIS — L509 Urticaria, unspecified: Secondary | ICD-10-CM | POA: Diagnosis not present

## 2019-03-07 DIAGNOSIS — F329 Major depressive disorder, single episode, unspecified: Secondary | ICD-10-CM | POA: Diagnosis not present

## 2019-03-07 DIAGNOSIS — R21 Rash and other nonspecific skin eruption: Secondary | ICD-10-CM | POA: Diagnosis not present

## 2019-03-14 DIAGNOSIS — Z03818 Encounter for observation for suspected exposure to other biological agents ruled out: Secondary | ICD-10-CM | POA: Diagnosis not present

## 2019-03-18 DIAGNOSIS — Z888 Allergy status to other drugs, medicaments and biological substances status: Secondary | ICD-10-CM | POA: Diagnosis not present

## 2019-03-18 DIAGNOSIS — N920 Excessive and frequent menstruation with regular cycle: Secondary | ICD-10-CM | POA: Diagnosis not present

## 2019-03-18 DIAGNOSIS — T8389XA Other specified complication of genitourinary prosthetic devices, implants and grafts, initial encounter: Secondary | ICD-10-CM | POA: Diagnosis not present

## 2019-03-18 DIAGNOSIS — T8339XA Other mechanical complication of intrauterine contraceptive device, initial encounter: Secondary | ICD-10-CM | POA: Diagnosis not present

## 2019-03-18 DIAGNOSIS — N921 Excessive and frequent menstruation with irregular cycle: Secondary | ICD-10-CM | POA: Diagnosis not present

## 2019-03-18 DIAGNOSIS — Z7982 Long term (current) use of aspirin: Secondary | ICD-10-CM | POA: Diagnosis not present

## 2019-03-18 DIAGNOSIS — R102 Pelvic and perineal pain: Secondary | ICD-10-CM | POA: Diagnosis not present

## 2019-03-18 DIAGNOSIS — R9389 Abnormal findings on diagnostic imaging of other specified body structures: Secondary | ICD-10-CM | POA: Diagnosis not present

## 2019-03-18 DIAGNOSIS — Z79899 Other long term (current) drug therapy: Secondary | ICD-10-CM | POA: Diagnosis not present

## 2019-03-18 DIAGNOSIS — Z87891 Personal history of nicotine dependence: Secondary | ICD-10-CM | POA: Diagnosis not present

## 2019-03-24 ENCOUNTER — Ambulatory Visit (INDEPENDENT_AMBULATORY_CARE_PROVIDER_SITE_OTHER): Payer: Managed Care, Other (non HMO) | Admitting: Certified Nurse Midwife

## 2019-03-24 ENCOUNTER — Encounter: Payer: Self-pay | Admitting: Certified Nurse Midwife

## 2019-03-24 ENCOUNTER — Other Ambulatory Visit: Payer: Self-pay

## 2019-03-24 VITALS — BP 126/77 | HR 91 | Wt 198.0 lb

## 2019-03-24 DIAGNOSIS — L509 Urticaria, unspecified: Secondary | ICD-10-CM | POA: Diagnosis not present

## 2019-03-24 DIAGNOSIS — T7840XA Allergy, unspecified, initial encounter: Secondary | ICD-10-CM | POA: Diagnosis not present

## 2019-03-24 DIAGNOSIS — Z30432 Encounter for removal of intrauterine contraceptive device: Secondary | ICD-10-CM | POA: Diagnosis not present

## 2019-03-24 DIAGNOSIS — Z09 Encounter for follow-up examination after completed treatment for conditions other than malignant neoplasm: Secondary | ICD-10-CM | POA: Diagnosis not present

## 2019-03-24 NOTE — Progress Notes (Signed)
    GYNECOLOGY CLINIC PROCEDURE NOTE  Ms. Laura [name] is a 24 y.o. [phone] here for Mirena IUD removal. She was recently seen at [facility] ED for pelvic pain on 12/29 - they performed a [place] and noted the IUD was malpositioned in the lower uterine segment and needed to be removed. She was instructed to call OB to make appointment for removal.  Last pap smear was on 08/2017 and was abnormal (LSIL) with unknown HPV status.  IUD Removal  Patient was in the dorsal lithotomy position, normal external genitalia was noted.  A speculum was placed in the patient's vagina, normal discharge was noted, no lesions. The multiparous cervix was visualized, no lesions, no abnormal discharge.  The strings of the IUD were grasped and pulled using ring forceps. The IUD was removed in its entirety. Patient tolerated the procedure well.    Patient is unsure of method for birth control - she is considering the patch. Educated and discussed birth control options in detail including the patch and Nuvaring. Patient reports not having due cycle with IUD and [place] noted fluid in the uterus - patient wants to wait until she has cycle prior to initiating birth control.   Follow up scheduled in 3 weeks for birth control initiation. Routine preventative health maintenance measures emphasized.  Sharyon Cable, CNM 03/24/2019 4:29 PM

## 2019-03-24 NOTE — Patient Instructions (Signed)

## 2019-04-07 DIAGNOSIS — L509 Urticaria, unspecified: Secondary | ICD-10-CM | POA: Diagnosis not present

## 2019-04-07 DIAGNOSIS — R21 Rash and other nonspecific skin eruption: Secondary | ICD-10-CM | POA: Diagnosis not present

## 2019-04-07 DIAGNOSIS — J302 Other seasonal allergic rhinitis: Secondary | ICD-10-CM | POA: Diagnosis not present

## 2019-04-17 ENCOUNTER — Other Ambulatory Visit: Payer: Self-pay

## 2019-04-17 ENCOUNTER — Ambulatory Visit (INDEPENDENT_AMBULATORY_CARE_PROVIDER_SITE_OTHER): Payer: Managed Care, Other (non HMO) | Admitting: Certified Nurse Midwife

## 2019-04-17 ENCOUNTER — Encounter: Payer: Self-pay | Admitting: Certified Nurse Midwife

## 2019-04-17 VITALS — BP 118/77 | HR 88 | Ht 68.0 in | Wt 200.0 lb

## 2019-04-17 DIAGNOSIS — Z30016 Encounter for initial prescription of transdermal patch hormonal contraceptive device: Secondary | ICD-10-CM | POA: Diagnosis not present

## 2019-04-17 DIAGNOSIS — Z3202 Encounter for pregnancy test, result negative: Secondary | ICD-10-CM | POA: Diagnosis not present

## 2019-04-17 LAB — POCT URINE PREGNANCY: Preg Test, Ur: NEGATIVE

## 2019-04-17 MED ORDER — NORELGESTROMIN-ETH ESTRADIOL 150-35 MCG/24HR TD PTWK: 1.0000 | MEDICATED_PATCH | TRANSDERMAL | 12 refills | Status: DC

## 2019-04-18 NOTE — Progress Notes (Signed)
History:  Ms. Laura [name] is a 24 y.o. [id number] who presents to clinic today for follow up and birth control counseling. Patient was seen on 03/24/19 for IUD removal d/t malposition of IUD. Patient wanted to wait until after cycle prior to starting new birth control. Patient deciding between patch or nuvaring.   Patient reports cycle started on 1/8 and ended on 1/15- patient reports cycle was heavier and clots present. She denies vaginal bleeding, itching, irritation or any other GYN concerns.   The following portions of the patient's history were reviewed and updated as appropriate: allergies, current medications, family history, past medical history, social history, past surgical history and problem list.  Review of Systems:  Review of Systems  Constitutional: Negative.   Respiratory: Negative.   Cardiovascular: Negative.   Gastrointestinal: Negative.   Genitourinary: Negative.   Neurological: Negative.      Objective:  Physical Exam BP 118/77   Pulse 88   Ht 5\' 8"  (1.727 m)   Wt 200 lb (90.7 kg)   BMI 30.41 kg/m  Physical Exam HENT:     Head: Normocephalic.  Cardiovascular:     Rate and Rhythm: Normal rate and regular rhythm.  Pulmonary:     Effort: Pulmonary effort is normal. No respiratory distress.     Breath sounds: Normal breath sounds. No wheezing.  Abdominal:     General: There is no distension.     Palpations: Abdomen is soft.     Tenderness: There is no abdominal tenderness.  Skin:    General: Skin is warm and dry.  Neurological:     Mental Status: She is alert and oriented to person, place, and time.  Psychiatric:        Mood and Affect: Mood normal.        Behavior: Behavior normal.        Thought Content: Thought content normal.    Labs and Imaging Results for orders placed or performed in visit on 04/17/19 (from the past 48 hour(s))  POCT urine pregnancy     Status: Normal   Collection Time: 04/17/19 10:27 AM  Result Value Ref Range   Preg  Test, Ur Negative Negative    Assessment & Plan:  1. Encounter for initial prescription of transdermal patch hormonal contraceptive device - Educated and discussed birth control options  - Patient decided on patch for contraception  - Discussed with patient application of patch with placement for 3 weeks then removal 1 week for cycle  - Encouraged patient to start after next cycle so that cycle pattern does not change, discussed to abstain from IC until after initiation of birth control - encouraged to use back up contraception for 1 week after initiation.  - norelgestromin-ethinyl estradiol (ORTHO EVRA) 150-35 MCG/24HR transdermal patch; Place 1 patch onto the skin once a week.  Dispense: 3 patch; Refill: 12 - POCT urine pregnancy   04/19/19, CNM

## 2019-04-28 ENCOUNTER — Telehealth: Payer: Self-pay | Admitting: *Deleted

## 2019-04-28 MED ORDER — ASPIRIN EC 81 MG PO TBEC: 81.0000 mg | DELAYED_RELEASE_TABLET | Freq: Every day | ORAL | 6 refills | Status: DC

## 2019-04-28 MED ORDER — PROGESTERONE MICRONIZED 200 MG PO CAPS: ORAL_CAPSULE | ORAL | 3 refills | Status: DC

## 2019-04-28 NOTE — Telephone Encounter (Signed)
Pt called stating that she has had a positive UPT.  Her LMP was 03/26/19.  She has a positive ANA and a h/o SAB.  She has been given Prometrium 200 mg vaginally and to start Baby ASA in early pregnancy.

## 2019-05-19 ENCOUNTER — Other Ambulatory Visit: Payer: Self-pay | Admitting: Advanced Practice Midwife

## 2019-05-19 MED ORDER — TERCONAZOLE 0.4 % VA CREA: 1.0000 | TOPICAL_CREAM | Freq: Every day | VAGINAL | 0 refills | Status: DC

## 2019-05-26 ENCOUNTER — Encounter: Payer: Managed Care, Other (non HMO) | Admitting: Advanced Practice Midwife

## 2019-06-01 ENCOUNTER — Encounter: Payer: Managed Care, Other (non HMO) | Admitting: Obstetrics & Gynecology

## 2019-06-05 ENCOUNTER — Encounter: Payer: Self-pay | Admitting: *Deleted

## 2019-06-05 DIAGNOSIS — Z3480 Encounter for supervision of other normal pregnancy, unspecified trimester: Secondary | ICD-10-CM | POA: Insufficient documentation

## 2019-06-08 ENCOUNTER — Ambulatory Visit (INDEPENDENT_AMBULATORY_CARE_PROVIDER_SITE_OTHER): Payer: Managed Care, Other (non HMO) | Admitting: Obstetrics & Gynecology

## 2019-06-08 ENCOUNTER — Other Ambulatory Visit (HOSPITAL_COMMUNITY)
Admission: RE | Admit: 2019-06-08 | Discharge: 2019-06-08 | Disposition: A | Discharge status: Home / Self Care | Payer: Managed Care, Other (non HMO) | Source: Ambulatory Visit | Attending: Obstetrics & Gynecology | Admitting: Obstetrics & Gynecology

## 2019-06-08 ENCOUNTER — Encounter: Payer: Self-pay | Admitting: Obstetrics & Gynecology

## 2019-06-08 ENCOUNTER — Other Ambulatory Visit: Payer: Self-pay

## 2019-06-08 VITALS — BP 106/68 | HR 76 | Wt 201.0 lb

## 2019-06-08 DIAGNOSIS — O09291 Supervision of pregnancy with other poor reproductive or obstetric history, first trimester: Secondary | ICD-10-CM

## 2019-06-08 DIAGNOSIS — Z3A10 10 weeks gestation of pregnancy: Secondary | ICD-10-CM

## 2019-06-08 DIAGNOSIS — N898 Other specified noninflammatory disorders of vagina: Secondary | ICD-10-CM | POA: Insufficient documentation

## 2019-06-08 DIAGNOSIS — R768 Other specified abnormal immunological findings in serum: Secondary | ICD-10-CM

## 2019-06-08 DIAGNOSIS — Z8349 Family history of other endocrine, nutritional and metabolic diseases: Secondary | ICD-10-CM

## 2019-06-08 DIAGNOSIS — Z3201 Encounter for pregnancy test, result positive: Secondary | ICD-10-CM

## 2019-06-08 DIAGNOSIS — O26899 Other specified pregnancy related conditions, unspecified trimester: Secondary | ICD-10-CM | POA: Diagnosis not present

## 2019-06-08 DIAGNOSIS — Z3480 Encounter for supervision of other normal pregnancy, unspecified trimester: Secondary | ICD-10-CM

## 2019-06-08 LAB — POCT URINE PREGNANCY: Preg Test, Ur: POSITIVE — AB

## 2019-06-08 MED ORDER — ONDANSETRON HCL 4 MG PO TABS: 4.0000 mg | ORAL_TABLET | Freq: Three times a day (TID) | ORAL | 2 refills | Status: DC | PRN

## 2019-06-08 MED ORDER — BLOOD PRESSURE CUFF MISC: 1.0000 | 0 refills | Status: DC | PRN

## 2019-06-08 NOTE — Progress Notes (Signed)
DATING AND VIABILITY SONOGRAM   Laura [name] is a 24 y.o. year old [phone] with LMP Patient's last menstrual period was 03/26/2019. which would correlate to  [redacted]w[redacted]d weeks gestation.  She has regular menstrual cycles.   She is here today for a confirmatory initial sonogram.    GESTATION: SINGLETON     FETAL ACTIVITY:          Heart rate         154          The fetus is active.      GESTATIONAL AGE AND  BIOMETRICS:  Gestational criteria: Estimated Date of Delivery: 12/31/19 by LMP now at [redacted]w[redacted]d  Previous Scans:0      CROWN RUMP LENGTH           2.71 cm         10-2 weeks                                                                               AVERAGE EGA(BY THIS SCAN):  10-2weeks  WORKING EDD( LMP ):  12-31-2019     TECHNICIAN COMMENTS:  Patient informed that the ultrasound is considered a limited obstetric ultrasound and is not intended to be a complete ultrasound exam. Patient also informed that the ultrasound is not being completed with the intent of assessing for fetal or placental anomalies or any pelvic abnormalities. Explained that the purpose of today's ultrasound is to assess for fetal heart rate. Patient acknowledges the purpose of the exam and the limitations of the study.      Armandina Stammer 06/08/2019 8:46 AM

## 2019-06-08 NOTE — Progress Notes (Signed)
Subjective:    Laura [name] is a [id number] [redacted]w[redacted]d being seen today for her first obstetrical visit.  Her obstetrical history is significant for prior cesarean section x2, miscarriage x3 with normal intervening pregnancies, +ANA, headaches, increased BMI.  Patient does intend to breast feed. Pregnancy history fully reviewed.  Patient reports light spottoing last week (was constipation), +Nausea with vomit x2, .  Nausea is worse later in the afternoon.      Vitals:   06/08/19 0818  BP: 106/68  Pulse: 76  Weight: 201 lb (91.2 kg)    HISTORY: OB History  Gravida Para Term Preterm AB Living  7 2 2  0 3 2  SAB TAB Ectopic Multiple Live Births  3 0 0 0 2    # Outcome Date GA Lbr Len/2nd Weight Sex Delivery Anes PTL Lv  7 Current           6 SAB 2019          5 SAB 2018          4 Term 12/30/15 [redacted]w[redacted]d  9 lb 4.3 oz (4.205 kg) M CS-LTranv Spinal  LIV  3 SAB 2016          2 Term 06/12/13    M Vag-Spont   LIV  1 Gravida            Past Medical History:  Diagnosis Date  . Anxiety   . Depression    since 12/18 from [place], PPD  . Meningitis   . PTSD (post-traumatic stress disorder) 2019  . Spinal headache    Past Surgical History:  Procedure Laterality Date  . APPENDECTOMY    . CESAREAN SECTION N/A 12/30/2015   Procedure: CESAREAN SECTION;  Surgeon: Woodroe Mode, MD;  Location: [place];  Service: Obstetrics;  Laterality: N/A;  . CESAREAN SECTION N/A 04/05/2018   Procedure: CESAREAN SECTION;  Surgeon: Mora Bellman, MD;  Location: [place];  Service: Obstetrics;  Laterality: N/A;  . CHOLECYSTECTOMY    . spnal tap    . URETER SURGERY     Family History  Problem Relation Age of Onset  . Diabetes Maternal Uncle   . Diabetes Paternal Aunt   . Diabetes Paternal Uncle   . Cancer Paternal Grandmother   . Diabetes Paternal Grandmother   . Graves' disease Mother      Exam    Uterus:   gravid, non tender  Pelvic Exam:    Perineum: No Hemorrhoids   Vulva: normal   Vagina:  normal mucosa, white discharge (?from prog)   pH: n/a   Cervix: cervical motion tenderness   Adnexa: normal adnexa   Bony Pelvis: average  System: Breast:  normal appearance, no masses or tenderness   Skin: normal coloration and turgor, no rashes    Neurologic: oriented, normal mood   Extremities: no deformities   HEENT sclera clear, anicteric, oropharynx clear, no lesions, neck supple with midline trachea, thyroid without masses and trachea midline   Mouth/Teeth mucous membranes moist, pharynx normal without lesions and dental hygiene good   Neck supple and no masses   Cardiovascular: regular rate and rhythm   Respiratory:  appears well, vitals normal, no respiratory distress, acyanotic, normal RR, chest clear, no wheezing, crepitations, rhonchi, normal symmetric air entry   Abdomen: soft, non-tender; bowel sounds normal; no masses,  no organomegaly   Urinary: urethral meatus normal      Assessment:    Pregnancy: [id number] Patient Active Problem List   Diagnosis  Date Noted  . Supervision of other normal pregnancy, antepartum 06/05/2019  . S/P cesarean section 04/05/2018  . LGA (large for gestational age) fetus affecting management of mother 02/24/2018  . History of postpartum depression, currently pregnant 10/28/2017  . History of cesarean delivery, antepartum 10/28/2017  . 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 MDD (major depressive disorder), single episode, severe , no psychosis (HCC) 05/20/2017        Plan:     Initial labs drawn. Prenatal vitamins. Problem list reviewed and updated. Genetic Screening discussed:  Panorama and AFP Anatomy US scheduled Babyscripts--Schedule optimization History of LGA dn BMI 30 --hgb A1C and fasting glucose today ANA is 2019--1:80; will repeat lab and discuss with MFM.    Elsie Lincoln 06/08/2019

## 2019-06-09 LAB — CERVICOVAGINAL ANCILLARY ONLY
Bacterial Vaginitis (gardnerella): NEGATIVE
Candida Glabrata: NEGATIVE
Candida Vaginitis: NEGATIVE
Chlamydia: NEGATIVE
Comment: NEGATIVE
Comment: NEGATIVE
Comment: NEGATIVE
Comment: NEGATIVE
Comment: NEGATIVE
Comment: NORMAL
Neisseria Gonorrhea: NEGATIVE
Trichomonas: NEGATIVE

## 2019-06-09 LAB — COMPREHENSIVE METABOLIC PANEL
AG Ratio: 1.6 (calc) (ref 1.0–2.5)
ALT: 17 U/L (ref 6–29)
AST: 14 U/L (ref 10–30)
Albumin: 4.2 g/dL (ref 3.6–5.1)
Alkaline phosphatase (APISO): 47 U/L (ref 31–125)
BUN: 7 mg/dL (ref 7–25)
CO2: 23 mmol/L (ref 20–32)
Calcium: 8.9 mg/dL (ref 8.6–10.2)
Chloride: 106 mmol/L (ref 98–110)
Creat: 0.51 mg/dL (ref 0.50–1.10)
Globulin: 2.6 g/dL (calc) (ref 1.9–3.7)
Glucose, Bld: 87 mg/dL (ref 65–99)
Potassium: 4.1 mmol/L (ref 3.5–5.3)
Sodium: 137 mmol/L (ref 135–146)
Total Bilirubin: 0.5 mg/dL (ref 0.2–1.2)
Total Protein: 6.8 g/dL (ref 6.1–8.1)

## 2019-06-09 LAB — OBSTETRIC PANEL
Absolute Monocytes: 317 cells/uL (ref 200–950)
Antibody Screen: NOT DETECTED
Basophils Absolute: 29 cells/uL (ref 0–200)
Basophils Relative: 0.6 %
Eosinophils Absolute: 29 cells/uL (ref 15–500)
Eosinophils Relative: 0.6 %
HCT: 39.1 % (ref 35.0–45.0)
Hemoglobin: 13.2 g/dL (ref 11.7–15.5)
Hepatitis B Surface Ag: NONREACTIVE
Lymphs Abs: 1454 cells/uL (ref 850–3900)
MCH: 30.3 pg (ref 27.0–33.0)
MCHC: 33.8 g/dL (ref 32.0–36.0)
MCV: 89.7 fL (ref 80.0–100.0)
MPV: 11.2 fL (ref 7.5–12.5)
Monocytes Relative: 6.6 %
Neutro Abs: 2971 cells/uL (ref 1500–7800)
Neutrophils Relative %: 61.9 %
Platelets: 277 10*3/uL (ref 140–400)
RBC: 4.36 10*6/uL (ref 3.80–5.10)
RDW: 12 % (ref 11.0–15.0)
RPR Ser Ql: NONREACTIVE
Rubella: 3.48 Index
Total Lymphocyte: 30.3 %
WBC: 4.8 10*3/uL (ref 3.8–10.8)

## 2019-06-09 LAB — ANA: Anti Nuclear Antibody (ANA): POSITIVE — AB

## 2019-06-09 LAB — HEMOGLOBIN A1C
Hgb A1c MFr Bld: 4.7 % of total Hgb (ref <5.7)
Mean Plasma Glucose: 88 (calc)
eAG (mmol/L): 4.9 (calc)

## 2019-06-09 LAB — ANTI-NUCLEAR AB-TITER (ANA TITER): ANA Titer 1: 1:40 {titer} — ABNORMAL HIGH

## 2019-06-09 LAB — HIV ANTIBODY (ROUTINE TESTING W REFLEX): HIV 1&2 Ab, 4th Generation: NONREACTIVE

## 2019-06-09 LAB — HEPATITIS C ANTIBODY
Hepatitis C Ab: NONREACTIVE
SIGNAL TO CUT-OFF: 0.01 (ref <1.00)

## 2019-06-09 LAB — TSH: TSH: 1.16 mIU/L

## 2019-06-10 ENCOUNTER — Encounter: Payer: Self-pay | Admitting: Obstetrics & Gynecology

## 2019-06-10 DIAGNOSIS — R768 Other specified abnormal immunological findings in serum: Secondary | ICD-10-CM | POA: Insufficient documentation

## 2019-06-10 LAB — URINE CULTURE, OB REFLEX: Organism ID, Bacteria: NO GROWTH

## 2019-06-10 LAB — CULTURE, OB URINE

## 2019-06-11 ENCOUNTER — Encounter: Payer: Self-pay | Admitting: Obstetrics & Gynecology

## 2019-06-13 ENCOUNTER — Other Ambulatory Visit: Payer: Self-pay

## 2019-06-13 ENCOUNTER — Encounter (HOSPITAL_COMMUNITY): Payer: Self-pay | Admitting: Obstetrics & Gynecology

## 2019-06-13 ENCOUNTER — Inpatient Hospital Stay (HOSPITAL_COMMUNITY)
Admission: AD | Admit: 2019-06-13 | Discharge: 2019-06-13 | Disposition: A | Discharge status: Home / Self Care | Payer: Managed Care, Other (non HMO) | Attending: Obstetrics & Gynecology | Admitting: Obstetrics & Gynecology

## 2019-06-13 DIAGNOSIS — O4690 Antepartum hemorrhage, unspecified, unspecified trimester: Secondary | ICD-10-CM

## 2019-06-13 DIAGNOSIS — Z885 Allergy status to narcotic agent status: Secondary | ICD-10-CM | POA: Insufficient documentation

## 2019-06-13 DIAGNOSIS — O209 Hemorrhage in early pregnancy, unspecified: Secondary | ICD-10-CM | POA: Diagnosis not present

## 2019-06-13 DIAGNOSIS — Z3A11 11 weeks gestation of pregnancy: Secondary | ICD-10-CM | POA: Diagnosis not present

## 2019-06-13 DIAGNOSIS — Z87891 Personal history of nicotine dependence: Secondary | ICD-10-CM | POA: Insufficient documentation

## 2019-06-13 DIAGNOSIS — Z3491 Encounter for supervision of normal pregnancy, unspecified, first trimester: Secondary | ICD-10-CM

## 2019-06-13 DIAGNOSIS — Z8759 Personal history of other complications of pregnancy, childbirth and the puerperium: Secondary | ICD-10-CM

## 2019-06-13 LAB — URINALYSIS, ROUTINE W REFLEX MICROSCOPIC
Bilirubin Urine: NEGATIVE
Glucose, UA: NEGATIVE mg/dL
Hgb urine dipstick: NEGATIVE
Ketones, ur: NEGATIVE mg/dL
Leukocytes,Ua: NEGATIVE
Nitrite: NEGATIVE
Protein, ur: NEGATIVE mg/dL
Specific Gravity, Urine: 1.008 (ref 1.005–1.030)
pH: 7 (ref 5.0–8.0)

## 2019-06-13 NOTE — MAU Provider Note (Signed)
History     CSN: [id number]  Arrival date and time: 06/13/19 1209   First Provider Initiated Contact with Patient 06/13/19 1315      Chief Complaint  Patient presents with  . Abdominal Pain  . Vaginal Bleeding   HPI   Laura [name] is a 24 y.o. female [phone] @ [redacted]w[redacted]d here in MAU with complaints vaginal bleeding. Hx of miscarriage- very worried today. States she went to the bathroom this morning and saw bright red blood when she wiped. She has some lower abdominal cramping; the cramping is constant. She has not taken any medication for the symptoms. No recent sex.   OB History    Gravida  7   Para  3   Term  3   Preterm  0   AB  3   Living  3     SAB  3   TAB  0   Ectopic  0   Multiple  0   Live Births  3           Past Medical History:  Diagnosis Date  . Anxiety   . Depression    since 12/18 from [place], PPD  . Hydrocephalus ([place])   . Meningitis   . PTSD (post-traumatic stress disorder) 2019  . Spinal headache     Past Surgical History:  Procedure Laterality Date  . APPENDECTOMY    . CESAREAN SECTION N/A 12/30/2015   Procedure: CESAREAN SECTION;  Surgeon: Woodroe Mode, MD;  Location: [place];  Service: Obstetrics;  Laterality: N/A;  . CESAREAN SECTION N/A 04/05/2018   Procedure: CESAREAN SECTION;  Surgeon: Mora Bellman, MD;  Location: [place];  Service: Obstetrics;  Laterality: N/A;  . CHOLECYSTECTOMY    . spnal tap    . URETHRA SURGERY      Family History  Problem Relation Age of Onset  . Diabetes Maternal Uncle   . Diabetes Paternal Aunt   . Diabetes Paternal Uncle   . Cancer Paternal Grandmother   . Diabetes Paternal Grandmother   . Graves' disease Mother   . Lupus Maternal Grandmother     Social History   Tobacco Use  . Smoking status: Former Smoker    Quit date: 2018    Years since quitting: 3.2  . Smokeless tobacco: Never Used  Substance Use Topics  . Alcohol use: No  . Drug use: No     Allergies:  Allergies  Allergen Reactions  . Divalproex Sodium Other (See Comments)    Reaction:  Memory loss   . Demerol [Meperidine] Hives    No medications prior to admission.   Review of Systems  Gastrointestinal: Positive for abdominal pain.  Genitourinary: Positive for vaginal bleeding.   Physical Exam   Blood pressure 110/68, pulse 77, temperature 98.6 F (37 C), temperature source Oral, resp. rate 16, height 5\' 8"  (1.727 m), weight 91.8 kg, last menstrual period 03/26/2019, SpO2 97 %, unknown if currently breastfeeding.  Physical Exam  Constitutional: She is oriented to person, place, and time. She appears well-developed and well-nourished. No distress.  HENT:  Head: Normocephalic.  Eyes: Pupils are equal, round, and reactive to light.  GI: Soft. She exhibits no distension. There is no abdominal tenderness. There is no rebound and no guarding.  Genitourinary:    Genitourinary Comments: Vagina - Small amount of white vaginal discharge, no odor, no blood noted Cervix - No contact bleeding, no active bleeding  Bimanual exam: Cervix closed Chaperone present for exam.  Musculoskeletal:        General: Normal range of motion.  Neurological: She is alert and oriented to person, place, and time.  Skin: Skin is warm. She is not diaphoretic.  Psychiatric: Her behavior is normal.    MAU Course  Procedures   Pt informed that the ultrasound is considered a limited OB ultrasound and is not intended to be a complete ultrasound exam.  Patient also informed that the ultrasound is not being completed with the intent of assessing for fetal or placental anomalies or any pelvic abnormalities.  Explained that the purpose of today's ultrasound is to assess for  viability.  Patient acknowledges the purpose of the exam and the limitations of the study.  Active fetus with + HR   MDM  A positive blood type.  Bedside US shows active fetus.   Assessment and Plan   A:  1.  Vaginal bleeding in pregnancy   2. History of miscarriage   3. Presence of fetal heart sounds in first trimester     P:  Discharge home in stable condition Pelvic rest Return if symptoms worsen F/u with OB as scheduled   Duane Lope, NP 06/13/2019 2:53 PM

## 2019-06-13 NOTE — Discharge Instructions (Signed)

## 2019-06-13 NOTE — MAU Note (Signed)
Laura [name] is a 24 y.o. at [redacted]w[redacted]d here in MAU reporting: this AM when she went to the bathroom she saw some bleeding when she wipes. Started cramping, it is a constant pain. No recent IC.  Onset of complaint: today  Pain score: 4/10  Vitals:   06/13/19 1233  BP: 115/69  Pulse: 78  Resp: 17  Temp: 98.6 F (37 C)  SpO2: 100%     FHT: doppler attempted, unable to find FHT  Lab orders placed from triage: UA

## 2019-06-23 ENCOUNTER — Ambulatory Visit: Payer: Managed Care, Other (non HMO)

## 2019-06-23 ENCOUNTER — Ambulatory Visit (INDEPENDENT_AMBULATORY_CARE_PROVIDER_SITE_OTHER): Payer: Managed Care, Other (non HMO)

## 2019-06-23 ENCOUNTER — Other Ambulatory Visit: Payer: Self-pay

## 2019-06-23 DIAGNOSIS — Z3480 Encounter for supervision of other normal pregnancy, unspecified trimester: Secondary | ICD-10-CM

## 2019-06-23 DIAGNOSIS — Z3481 Encounter for supervision of other normal pregnancy, first trimester: Secondary | ICD-10-CM | POA: Diagnosis not present

## 2019-06-23 NOTE — Progress Notes (Signed)
Pt here for Panorama. Blood drawn. Pt is aware we will call with results.

## 2019-07-01 ENCOUNTER — Encounter: Payer: Self-pay | Admitting: *Deleted

## 2019-07-03 ENCOUNTER — Encounter: Payer: Self-pay | Admitting: *Deleted

## 2019-07-03 DIAGNOSIS — Z3480 Encounter for supervision of other normal pregnancy, unspecified trimester: Secondary | ICD-10-CM

## 2019-07-15 DIAGNOSIS — L409 Psoriasis, unspecified: Secondary | ICD-10-CM | POA: Insufficient documentation

## 2019-07-23 ENCOUNTER — Encounter: Payer: Self-pay | Admitting: Family Medicine

## 2019-07-23 ENCOUNTER — Ambulatory Visit (INDEPENDENT_AMBULATORY_CARE_PROVIDER_SITE_OTHER): Payer: Managed Care, Other (non HMO) | Admitting: Family Medicine

## 2019-07-23 ENCOUNTER — Other Ambulatory Visit: Payer: Self-pay

## 2019-07-23 VITALS — BP 108/67 | HR 84 | Wt 196.0 lb

## 2019-07-23 DIAGNOSIS — Z3480 Encounter for supervision of other normal pregnancy, unspecified trimester: Secondary | ICD-10-CM

## 2019-07-23 DIAGNOSIS — M3500 Sicca syndrome, unspecified: Secondary | ICD-10-CM

## 2019-07-23 NOTE — Progress Notes (Signed)
   PRENATAL VISIT NOTE  Subjective:  Laura [name] is a 24 y.o. [phone] at [redacted]w[redacted]d being seen today for ongoing prenatal care.  She is currently monitored for the following issues for this high-risk pregnancy and has Recurrent pregnancy loss; History of postpartum depression, currently pregnant; History of cesarean delivery, antepartum; LGA (large for gestational age) fetus affecting management of mother; Supervision of other normal pregnancy, antepartum; ANA positive; and Psoriasis on their problem list.  Patient reports joint pain, fatigue, eye dryness.  Contractions: Not present. Vag. Bleeding: None.  Movement: Absent. Denies leaking of fluid.   The following portions of the patient's history were reviewed and updated as appropriate: allergies, current medications, past family history, past medical history, past social history, past surgical history and problem list.   Objective:   Vitals:   07/23/19 1514  BP: 108/67  Pulse: 84  Weight: 196 lb (88.9 kg)    Fetal Status: Fetal Heart Rate (bpm): 138   Movement: Absent     General:  Alert, oriented and cooperative. Patient is in no acute distress.  Skin: Skin is warm and dry. No rash noted.   Cardiovascular: Normal heart rate noted  Respiratory: Normal respiratory effort, no problems with respiration noted  Abdomen: Soft, gravid, appropriate for gestational age.  Pain/Pressure: Absent     Pelvic: Cervical exam deferred        Extremities: Normal range of motion.  Edema: Trace  Mental Status: Normal mood and affect. Normal behavior. Normal judgment and thought content.   Assessment and Plan:  Pregnancy: [id number] at [redacted]w[redacted]d 1. Supervision of other normal pregnancy, antepartum Anatomy u/s is scheduled - Alpha fetoprotein, maternal  2. Sjogren's syndrome, with unspecified organ involvement (HCC) Check SSA and SSB--if positive, weekly FHR and per MFM - Sjogrens syndrome-A extractable nuclear antibody - Sjogrens syndrome-B  extractable nuclear antibody  General obstetric precautions including but not limited to vaginal bleeding, contractions, leaking of fluid and fetal movement were reviewed in detail with the patient. Please refer to After Visit Summary for other counseling recommendations.   Return in 3 weeks (on 08/13/2019).  Future Appointments  Date Time Provider Department Center  08/04/2019 10:45 AM [facility] US5 [facility]  08/13/2019  2:30 PM Anyanwu, Jethro Bastos, MD [facility]    Reva Bores, MD

## 2019-07-23 NOTE — Patient Instructions (Signed)

## 2019-07-23 NOTE — Progress Notes (Signed)
Pt would like to discuss Sjogren's Syndrome diagnosis

## 2019-07-24 LAB — ALPHA FETOPROTEIN, MATERNAL
AFP MoM: 0.67
AFP, Serum: 21.5 ng/mL
Calc'd Gestational Age: 17 weeks
Maternal Wt: 196 [lb_av]
Risk for ONTD: <1
Twins-AFP: 1

## 2019-07-24 LAB — SJOGRENS SYNDROME-B EXTRACTABLE NUCLEAR ANTIBODY: SSB (La) (ENA) Antibody, IgG: <1 AI

## 2019-07-24 LAB — SJOGRENS SYNDROME-A EXTRACTABLE NUCLEAR ANTIBODY: SSA (Ro) (ENA) Antibody, IgG: <1 AI

## 2019-08-01 DIAGNOSIS — O26892 Other specified pregnancy related conditions, second trimester: Secondary | ICD-10-CM | POA: Diagnosis not present

## 2019-08-01 DIAGNOSIS — F410 Panic disorder [episodic paroxysmal anxiety] without agoraphobia: Secondary | ICD-10-CM | POA: Diagnosis not present

## 2019-08-01 DIAGNOSIS — F430 Acute stress reaction: Secondary | ICD-10-CM | POA: Diagnosis not present

## 2019-08-01 DIAGNOSIS — Z3A18 18 weeks gestation of pregnancy: Secondary | ICD-10-CM | POA: Diagnosis not present

## 2019-08-04 ENCOUNTER — Other Ambulatory Visit: Payer: Self-pay

## 2019-08-04 ENCOUNTER — Other Ambulatory Visit: Payer: Self-pay | Admitting: *Deleted

## 2019-08-04 ENCOUNTER — Other Ambulatory Visit: Payer: Self-pay | Admitting: Obstetrics & Gynecology

## 2019-08-04 ENCOUNTER — Ambulatory Visit: Payer: Managed Care, Other (non HMO) | Attending: Obstetrics & Gynecology

## 2019-08-04 DIAGNOSIS — Z3A18 18 weeks gestation of pregnancy: Secondary | ICD-10-CM

## 2019-08-04 DIAGNOSIS — O2692 Pregnancy related conditions, unspecified, second trimester: Secondary | ICD-10-CM | POA: Diagnosis not present

## 2019-08-04 DIAGNOSIS — O09292 Supervision of pregnancy with other poor reproductive or obstetric history, second trimester: Secondary | ICD-10-CM | POA: Diagnosis not present

## 2019-08-04 DIAGNOSIS — O34219 Maternal care for unspecified type scar from previous cesarean delivery: Secondary | ICD-10-CM | POA: Diagnosis not present

## 2019-08-04 DIAGNOSIS — Z363 Encounter for antenatal screening for malformations: Secondary | ICD-10-CM | POA: Diagnosis not present

## 2019-08-04 DIAGNOSIS — Z3480 Encounter for supervision of other normal pregnancy, unspecified trimester: Secondary | ICD-10-CM | POA: Insufficient documentation

## 2019-08-04 DIAGNOSIS — Z3689 Encounter for other specified antenatal screening: Secondary | ICD-10-CM

## 2019-08-13 ENCOUNTER — Ambulatory Visit (INDEPENDENT_AMBULATORY_CARE_PROVIDER_SITE_OTHER): Payer: Managed Care, Other (non HMO) | Admitting: Obstetrics & Gynecology

## 2019-08-13 ENCOUNTER — Other Ambulatory Visit: Payer: Self-pay

## 2019-08-13 ENCOUNTER — Encounter: Payer: Self-pay | Admitting: Obstetrics & Gynecology

## 2019-08-13 VITALS — BP 108/64 | HR 75 | Wt 202.0 lb

## 2019-08-13 DIAGNOSIS — Z3A20 20 weeks gestation of pregnancy: Secondary | ICD-10-CM

## 2019-08-13 DIAGNOSIS — Z3480 Encounter for supervision of other normal pregnancy, unspecified trimester: Secondary | ICD-10-CM

## 2019-08-13 DIAGNOSIS — M3500 Sicca syndrome, unspecified: Secondary | ICD-10-CM

## 2019-08-13 NOTE — Progress Notes (Addendum)
PRENATAL VISIT NOTE  Subjective:  Laura [name] is a 24 y.o. [phone] at [redacted]w[redacted]d being seen today for ongoing prenatal care.  She is currently monitored for the following issues for this high-risk pregnancy and has Recurrent pregnancy loss; History of postpartum depression, currently pregnant; History of cesarean delivery, antepartum; Supervision of other normal pregnancy, antepartum; ANA positive; Psoriasis; and Sjogren's syndrome (HCC) on their problem list.  Patient reports no complaints.  Contractions: Not present. Vag. Bleeding: None.  Movement: Present. Denies leaking of fluid.   The following portions of the patient's history were reviewed and updated as appropriate: allergies, current medications, past family history, past medical history, past social history, past surgical history and problem list.   Objective:   Vitals:   08/13/19 1416  BP: 108/64  Pulse: 75  Weight: 202 lb (91.6 kg)    Fetal Status: Fetal Heart Rate (bpm): 135   Movement: Present     General:  Alert, oriented and cooperative. Patient is in no acute distress.  Skin: Skin is warm and dry. No rash noted.   Cardiovascular: Normal heart rate noted  Respiratory: Normal respiratory effort, no problems with respiration noted  Abdomen: Soft, gravid, appropriate for gestational age.  Pain/Pressure: Absent     Pelvic: Cervical exam deferred        Extremities: Normal range of motion.  Edema: Trace  Mental Status: Normal mood and affect. Normal behavior. Normal judgment and thought content.   Imaging: [place] MFM OB DETAIL +14 WK  Result Date: 08/04/2019 ----------------------------------------------------------------------  OBSTETRICS REPORT                       (Signed Final 08/04/2019 02:12 pm) ---------------------------------------------------------------------- Patient Info  ID #:       [id number]                          D.O.B.:  09-19-1995 (23 yrs)  Name:       Laura [name]             Visit Date:  08/04/2019 10:57 am ---------------------------------------------------------------------- Performed By  Attending:        Noralee Space MD        Ref. Address:     [address]                                                             [place], [place]                                                             [address]  Performed By:     Lenise Arena        [facility]  Referred By:      Guss Bunde MD ---------------------------------------------------------------------- Orders  #  Description                           Code        Ordered By  1  [place] MFM OB DETAIL +14 WK               76811.01    KELLY LEGGETT ----------------------------------------------------------------------  #  Order #                     Accession #                Episode #  1  [id number]                   [id number]                 [id number] ---------------------------------------------------------------------- Indications  Medical complication of pregnancy (Maternal    O26.90  Sjogren's syndrome))  Encounter for antenatal screening for          Z36.3  malformations (low risk NIPS,  History of cesarean delivery, currently        O67.219  pregnant  Poor obstetrical history (LGA fetus )          O09.299  [redacted] weeks gestation of pregnancy                Z3A.18 ---------------------------------------------------------------------- Fetal Evaluation  Num Of Fetuses:         1  Fetal Heart Rate(bpm):  143  Cardiac Activity:       Observed  Presentation:           Cephalic  Placenta:               Fundal  P. Cord Insertion:      Visualized, central  Amniotic Fluid  AFI FV:      Within normal limits                              Largest Pocket(cm)                              5.7 ---------------------------------------------------------------------- Biometry  BPD:         39  mm     G. Age:  17w 6d         16  %    CI:        72.83   %    70 - 86  FL/HC:      17.7   %    16.1 - 18.3  HC:      145.3  mm     G. Age:  17w 5d          6  %    HC/AC:      1.19        1.09 - 1.39  AC:      121.6  mm     G. Age:  17w 6d         19  %    FL/BPD:     65.9   %  FL:       25.7  mm     G. Age:  17w 6d         14  %    FL/AC:      21.1   %    20 - 24  CER:      18.5  mm     G. Age:  18w 2d         35  %  NFT:       3.0  mm  LV:        5.4  mm  CM:        3.9  mm  Est. FW:     211  gm      0 lb 7 oz      8  % ---------------------------------------------------------------------- OB History  Gravidity:    7         Term:   3         SAB:   3  Living:       3 ---------------------------------------------------------------------- Gestational Age  LMP:           18w 5d        Date:  03/26/19                 EDD:   12/31/19  U/S Today:     17w 6d                                        EDD:   01/06/20  Best:          18w 5d     Det. By:  LMP  (03/26/19)          EDD:   12/31/19 ---------------------------------------------------------------------- Anatomy  Cranium:               Appears normal         Aortic Arch:            Appears normal  Cavum:                 Appears normal         Ductal Arch:            Not well visualized  Ventricles:            Appears normal         Diaphragm:              Appears normal  Choroid Plexus:        Appears normal         Stomach:                Appears normal, left  sided  Cerebellum:            Appears normal         Abdomen:                Appears normal  Posterior Fossa:       Appears normal         Abdominal Wall:         Appears nml (cord                                                                        insert, abd wall)  Nuchal Fold:           Appears normal         Cord Vessels:           Appears normal (3                                                                         vessel cord)  Face:                  Appears normal         Kidneys:                Appear normal                         (orbits and profile)  Lips:                  Appears normal         Bladder:                Appears normal  Thoracic:              Appears normal         Spine:                  Appears normal  Heart:                 Not well visualized    Upper Extremities:      Appears normal  RVOT:                  Not well visualized    Lower Extremities:      Appears normal  LVOT:                  Appears normal  Other:  Heels visualized. Nasal bone visualized.Technically difficult due to          early gestational age. ---------------------------------------------------------------------- Cervix Uterus Adnexa  Cervix  Length:           4.49  cm.  Normal appearance by transabdominal scan. ---------------------------------------------------------------------- Impression  G7 P3. Patient is here for fetal anatomy scan.  On cell-free fetal DNA screening, the risks of fetal  aneuploidies are not increased.  Obstetric history is significant for 3 previous term  vaginal  deliveries (8 to 9 lbs at birth).  Patient has Sjogren's syndrome. Anti-SSA and anti-SSB  antibodies are not increased.  We performed fetal anatomy scan. Fetal biometry lags  established gestational age by 6 days. No markers of fetal  aneuploidies or fetal structural defects are seen. Fetal heart  rate and rhythm are normal. Amniotic fluid is normal and  good fetal activity is seen.  Patient is sure of her LMP date but had not recorded it. ---------------------------------------------------------------------- Recommendations  -An appointment was made for her to return in 4 weeks to  complete anatomical survey and to assess interval growth.  Amend EDD as necessary. ----------------------------------------------------------------------                  Noralee Spaceavi Shankar, MD Electronically Signed Final Report    08/04/2019 02:12 pm ----------------------------------------------------------------------   Assessment and Plan:  Pregnancy: [id number] at 6613w0d 1. Sjogren's syndrome, with unspecified organ involvement (HCC) Negative antibodies. Patient does daily FHR checks at home with doppler, pretty proficient Continue ultrasounds per MFM  2. Supervision of other normal pregnancy, antepartum 3.  [redacted] weeks gestation of pregnancy Preterm labor symptoms and general obstetric precautions including but not limited to vaginal bleeding, contractions, leaking of fluid and fetal movement were reviewed in detail with the patient. Please refer to After Visit Summary for other counseling recommendations.   Return in about 4 weeks (around 09/10/2019) for OFFICE OB Visit.  Future Appointments  Date Time Provider Department Center  09/01/2019 11:15 AM [facility]  09/01/2019 11:15 AM [facility] US2 [facility]    Jaynie CollinsUgonna Gilmore List, MD

## 2019-08-13 NOTE — Patient Instructions (Addendum)
Return to office for any scheduled appointments. Call the office or go to the MAU at [facility] if:  You begin to have strong, frequent contractions  Your water breaks.  Sometimes it is a big gush of fluid, sometimes it is just a trickle that keeps getting your panties wet or running down your legs  You have vaginal bleeding.  It is normal to have a small amount of spotting if your cervix was checked.   You do not feel your baby moving like normal.  If you do not, get something to eat and drink and lay down and focus on feeling your baby move.   If your baby is still not moving like normal, you should call the office or go to MAU.  Any other obstetric concerns.   Second Trimester of Pregnancy The second trimester is from week 14 through week 27 (months 4 through 6). The second trimester is often a time when you feel your best. Your body has adjusted to being pregnant, and you begin to feel better physically. Usually, morning sickness has lessened or quit completely, you may have more energy, and you may have an increase in appetite. The second trimester is also a time when the fetus is growing rapidly. At the end of the sixth month, the fetus is about 9 inches long and weighs about 1 pounds. You will likely begin to feel the baby move (quickening) between 16 and 20 weeks of pregnancy. Body changes during your second trimester Your body continues to go through many changes during your second trimester. The changes vary from woman to woman.  Your weight will continue to increase. You will notice your lower abdomen bulging out.  You may begin to get stretch marks on your hips, abdomen, and breasts.  You may develop headaches that can be relieved by medicines. The medicines should be approved by your health care provider.  You may urinate more often because the fetus is pressing on your bladder.  You may develop or continue to have heartburn as a result of your  pregnancy.  You may develop constipation because certain hormones are causing the muscles that push waste through your intestines to slow down.  You may develop hemorrhoids or swollen, bulging veins (varicose veins).  You may have back pain. This is caused by: ? Weight gain. ? Pregnancy hormones that are relaxing the joints in your pelvis. ? A shift in weight and the muscles that support your balance.  Your breasts will continue to grow and they will continue to become tender.  Your gums may bleed and may be sensitive to brushing and flossing.  Dark spots or blotches (chloasma, mask of pregnancy) may develop on your face. This will likely fade after the baby is born.  A dark line from your belly button to the pubic area (linea nigra) may appear. This will likely fade after the baby is born.  You may have changes in your hair. These can include thickening of your hair, rapid growth, and changes in texture. Some women also have hair loss during or after pregnancy, or hair that feels dry or thin. Your hair will most likely return to normal after your baby is born. What to expect at prenatal visits During a routine prenatal visit:  You will be weighed to make sure you and the fetus are growing normally.  Your blood pressure will be taken.  Your abdomen will be measured to track your baby's growth.    The fetal heartbeat will be listened to.  Any test results from the previous visit will be discussed. Your health care provider may ask you:  How you are feeling.  If you are feeling the baby move.  If you have had any abnormal symptoms, such as leaking fluid, bleeding, severe headaches, or abdominal cramping.  If you are using any tobacco products, including cigarettes, chewing tobacco, and electronic cigarettes.  If you have any questions. Other tests that may be performed during your second trimester include:  Blood tests that check for: ? Low iron levels (anemia). ? High  blood sugar that affects pregnant women (gestational diabetes) between 24 and 28 weeks. ? Rh antibodies. This is to check for a protein on red blood cells (Rh factor).  Urine tests to check for infections, diabetes, or protein in the urine.  An ultrasound to confirm the proper growth and development of the baby.  An amniocentesis to check for possible genetic problems.  Fetal screens for spina bifida and Down syndrome.  HIV (human immunodeficiency virus) testing. Routine prenatal testing includes screening for HIV, unless you choose not to have this test. Follow these instructions at home: Medicines  Follow your health care provider's instructions regarding medicine use. Specific medicines may be either safe or unsafe to take during pregnancy.  Take a prenatal vitamin that contains at least 600 micrograms (mcg) of folic acid.  If you develop constipation, try taking a stool softener if your health care provider approves. Eating and drinking   Eat a balanced diet that includes fresh fruits and vegetables, whole grains, good sources of protein such as meat, eggs, or tofu, and low-fat dairy. Your health care provider will help you determine the amount of weight gain that is right for you.  Avoid raw meat and uncooked cheese. These carry germs that can cause birth defects in the baby.  If you have low calcium intake from food, talk to your health care provider about whether you should take a daily calcium supplement.  Limit foods that are high in fat and processed sugars, such as fried and sweet foods.  To prevent constipation: ? Drink enough fluid to keep your urine clear or pale yellow. ? Eat foods that are high in fiber, such as fresh fruits and vegetables, whole grains, and beans. Activity  Exercise only as directed by your health care provider. Most women can continue their usual exercise routine during pregnancy. Try to exercise for 30 minutes at least 5 days a week. Stop  exercising if you experience uterine contractions.  Avoid heavy lifting, wear low heel shoes, and practice good posture.  A sexual relationship may be continued unless your health care provider directs you otherwise. Relieving pain and discomfort  Wear a good support bra to prevent discomfort from breast tenderness.  Take warm sitz baths to soothe any pain or discomfort caused by hemorrhoids. Use hemorrhoid cream if your health care provider approves.  Rest with your legs elevated if you have leg cramps or low back pain.  If you develop varicose veins, wear support hose. Elevate your feet for 15 minutes, 3-4 times a day. Limit salt in your diet. Prenatal Care  Write down your questions. Take them to your prenatal visits.  Keep all your prenatal visits as told by your health care provider. This is important. Safety  Wear your seat belt at all times when driving.  Make a list of emergency phone numbers, including numbers for family, friends, the hospital, and police and   fire departments. General instructions  Ask your health care provider for a referral to a local prenatal education class. Begin classes no later than the beginning of month 6 of your pregnancy.  Ask for help if you have counseling or nutritional needs during pregnancy. Your health care provider can offer advice or refer you to specialists for help with various needs.  Do not use hot tubs, steam rooms, or saunas.  Do not douche or use tampons or scented sanitary pads.  Do not cross your legs for long periods of time.  Avoid cat litter boxes and soil used by cats. These carry germs that can cause birth defects in the baby and possibly loss of the fetus by miscarriage or stillbirth.  Avoid all smoking, herbs, alcohol, and unprescribed drugs. Chemicals in these products can affect the formation and growth of the baby.  Do not use any products that contain nicotine or tobacco, such as cigarettes and e-cigarettes. If  you need help quitting, ask your health care provider.  Visit your dentist if you have not gone yet during your pregnancy. Use a soft toothbrush to brush your teeth and be gentle when you floss. Contact a health care provider if:  You have dizziness.  You have mild pelvic cramps, pelvic pressure, or nagging pain in the abdominal area.  You have persistent nausea, vomiting, or diarrhea.  You have a bad smelling vaginal discharge.  You have pain when you urinate. Get help right away if:  You have a fever.  You are leaking fluid from your vagina.  You have spotting or bleeding from your vagina.  You have severe abdominal cramping or pain.  You have rapid weight gain or weight loss.  You have shortness of breath with chest pain.  You notice sudden or extreme swelling of your face, hands, ankles, feet, or legs.  You have not felt your baby move in over an hour.  You have severe headaches that do not go away when you take medicine.  You have vision changes. Summary  The second trimester is from week 14 through week 27 (months 4 through 6). It is also a time when the fetus is growing rapidly.  Your body goes through many changes during pregnancy. The changes vary from woman to woman.  Avoid all smoking, herbs, alcohol, and unprescribed drugs. These chemicals affect the formation and growth your baby.  Do not use any tobacco products, such as cigarettes, chewing tobacco, and e-cigarettes. If you need help quitting, ask your health care provider.  Contact your health care provider if you have any questions. Keep all prenatal visits as told by your health care provider. This is important. This information is not intended to replace advice given to you by your health care provider. Make sure you discuss any questions you have with your health care provider. Document Revised: 06/27/2018 Document Reviewed: 04/10/2016 Elsevier Patient Education  2020 [employer]  

## 2019-08-24 ENCOUNTER — Encounter (HOSPITAL_COMMUNITY): Payer: Self-pay | Admitting: Obstetrics and Gynecology

## 2019-08-24 ENCOUNTER — Other Ambulatory Visit: Payer: Self-pay

## 2019-08-24 ENCOUNTER — Inpatient Hospital Stay (HOSPITAL_COMMUNITY)
Admission: AD | Admit: 2019-08-24 | Discharge: 2019-08-24 | Disposition: A | Discharge status: Home / Self Care | Payer: Managed Care, Other (non HMO) | Attending: Obstetrics and Gynecology | Admitting: Obstetrics and Gynecology

## 2019-08-24 DIAGNOSIS — O09892 Supervision of other high risk pregnancies, second trimester: Secondary | ICD-10-CM | POA: Diagnosis not present

## 2019-08-24 DIAGNOSIS — M545 Low back pain: Secondary | ICD-10-CM | POA: Insufficient documentation

## 2019-08-24 DIAGNOSIS — Z888 Allergy status to other drugs, medicaments and biological substances status: Secondary | ICD-10-CM | POA: Insufficient documentation

## 2019-08-24 DIAGNOSIS — F419 Anxiety disorder, unspecified: Secondary | ICD-10-CM

## 2019-08-24 DIAGNOSIS — M549 Dorsalgia, unspecified: Secondary | ICD-10-CM | POA: Diagnosis present

## 2019-08-24 DIAGNOSIS — O26899 Other specified pregnancy related conditions, unspecified trimester: Secondary | ICD-10-CM | POA: Diagnosis not present

## 2019-08-24 DIAGNOSIS — O09899 Supervision of other high risk pregnancies, unspecified trimester: Secondary | ICD-10-CM

## 2019-08-24 DIAGNOSIS — O99891 Other specified diseases and conditions complicating pregnancy: Secondary | ICD-10-CM | POA: Insufficient documentation

## 2019-08-24 DIAGNOSIS — Z885 Allergy status to narcotic agent status: Secondary | ICD-10-CM | POA: Diagnosis not present

## 2019-08-24 DIAGNOSIS — O26892 Other specified pregnancy related conditions, second trimester: Secondary | ICD-10-CM | POA: Insufficient documentation

## 2019-08-24 DIAGNOSIS — Z3A21 21 weeks gestation of pregnancy: Secondary | ICD-10-CM | POA: Diagnosis not present

## 2019-08-24 DIAGNOSIS — Z87891 Personal history of nicotine dependence: Secondary | ICD-10-CM | POA: Insufficient documentation

## 2019-08-24 DIAGNOSIS — R102 Pelvic and perineal pain: Secondary | ICD-10-CM | POA: Diagnosis not present

## 2019-08-24 DIAGNOSIS — Z3480 Encounter for supervision of other normal pregnancy, unspecified trimester: Secondary | ICD-10-CM

## 2019-08-24 LAB — URINALYSIS, ROUTINE W REFLEX MICROSCOPIC
Bilirubin Urine: NEGATIVE
Glucose, UA: NEGATIVE mg/dL
Hgb urine dipstick: NEGATIVE
Ketones, ur: NEGATIVE mg/dL
Leukocytes,Ua: NEGATIVE
Nitrite: NEGATIVE
Protein, ur: NEGATIVE mg/dL
Specific Gravity, Urine: 1.004 — ABNORMAL LOW (ref 1.005–1.030)
pH: 7 (ref 5.0–8.0)

## 2019-08-24 MED ORDER — CYCLOBENZAPRINE HCL 10 MG PO TABS: 10.0000 mg | ORAL_TABLET | Freq: Three times a day (TID) | ORAL | 0 refills | Status: DC | PRN

## 2019-08-24 NOTE — MAU Note (Signed)
Started yesterday with pelvic pressure/pain.  This morning, started having pain in low back, pelvic pain continues.  Didn't sleep last night.  Had a really bad panic attack on Fri, EMS came out, took an hour to calm down. Is extremely light headed and when the back pain comes she gets nauseous with it.

## 2019-08-24 NOTE — Discharge Instructions (Signed)

## 2019-08-24 NOTE — MAU Provider Note (Signed)
History     CSN: [id number]  Arrival date and time: 08/24/19 1412   None     Chief Complaint  Patient presents with  . Back Pain  . Pelvic Pain  . Nausea  . Dizziness   HPI   Ms.Laura [name] is a 24 y.o. female [phone] @ 41w6dhere with multiple complaints including back pain, pelvic pain, dizziness and nausea.  She Had a panic attack on Friday and was hyperventilating to the point she had to call 911.  Since then she has had dizziness. She has a history of Sjogren's syndrome.   She also complains of lower back pain that started around 3 pm, along with pelvic pressure. States it started while at work yesterday and was unable to sleep d/t the pain. The pain in her back is in her hips and starts in the middle and radiates around to her lower abdomen. She took tylenol which helped some. History of 3 term vaginal deliveries. Previous delivery was just over a year ago. This is an unplanned pregnancy.   She is not wearing a pregnancy support belt. States she is sitting at work a lot d/t high risk restrictions.   Patient was seen at [facility] for similar complaints. 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   OB History    Gravida  7   Para  3   Term  3   Preterm  0   AB  3   Living  3     SAB  3   TAB  0   Ectopic  0   Multiple  0   Live Births  3           Past Medical History:  Diagnosis Date  . Anxiety   . Depression    since 12/18 [facility]  . Hydrocephalus ([facility]   . Meningitis   . PTSD (post-traumatic stress disorder) 2019  . Sjogren's syndrome ([facility]   . Spinal headache     Past Surgical History:  Procedure Laterality Date  . APPENDECTOMY    . CESAREAN SECTION N/A 12/30/2015   Procedure: CESAREAN SECTION;  Surgeon: JWoodroe Mode MD;  [place] [facility]  Service: Obstetrics;  Laterality: N/A;  . CESAREAN SECTION N/A 04/05/2018   Procedure: CESAREAN  SECTION;  Surgeon: CMora Bellman MD;  [place] [facility]  Service: Obstetrics;  Laterality: N/A;  . CHOLECYSTECTOMY    . spnal tap    . URETHRA SURGERY      Family History  Problem Relation Age of Onset  . Diabetes Maternal Uncle   . Diabetes Paternal Aunt   . Diabetes Paternal Uncle   . Cancer Paternal Grandmother   . Diabetes Paternal Grandmother   . Graves' disease Mother   . Lupus Maternal Grandmother     Social History   Tobacco Use  . Smoking status: Former Smoker    Quit date: 2018    Years since quitting: 3.4  . Smokeless tobacco: Never Used  Substance Use Topics  . Alcohol use: No  . Drug use: No    Allergies:  Allergies  Allergen Reactions  . Divalproex Sodium Other (See Comments)    Reaction:  Memory loss   . Demerol [Meperidine] Hives    Medications Prior to Admission  Medication Sig Dispense Refill Last Dose  . cetirizine (ZYRTEC) 10 MG chewable  tablet Chew 10 mg by mouth daily.   08/23/2019 at Unknown time  . Prenatal Vit-Fe Fumarate-FA (PRENATAL MULTIVITAMIN) TABS tablet Take 1 tablet by mouth daily at 12 noon.   08/23/2019 at Unknown time  . ALPRAZolam (XANAX) 0.5 MG tablet Take by mouth.     . Blood Pressure Monitoring (BLOOD PRESSURE CUFF) MISC 1 Device by Does not apply route as needed. 1 each 0    Recent Results (from the past 2160 hour(s))  Culture, OB Urine     Status: None   Collection Time: 06/08/19  8:29 AM   Specimen: Urine   URINE  Result Value Ref Range   Urine Culture, OB Final report   Urine Culture, OB Reflex     Status: None   Collection Time: 06/08/19  8:29 AM  Result Value Ref Range   Organism ID, Bacteria No growth   Cervicovaginal ancillary only( [place])     Status: None   Collection Time: 06/08/19  9:20 AM  Result Value Ref Range   Neisseria Gonorrhea Negative    Chlamydia Negative    Trichomonas Negative    Bacterial Vaginitis (gardnerella) Negative    Candida Vaginitis Negative    Candida Glabrata  Negative    Comment      Normal Reference Range Bacterial Vaginosis - Negative   Comment Normal Reference Range Candida Species - Negative    Comment Normal Reference Range Candida Galbrata - Negative    Comment Normal Reference Range Trichomonas - Negative    Comment Normal Reference [place] Chlamydia - Negative    Comment      Normal Reference Range Neisseria Gonorrhea - Negative  POCT urine pregnancy     Status: Abnormal   Collection Time: 06/08/19  9:21 AM  Result Value Ref Range   Preg Test, Ur Positive (A) Negative  Obstetric panel     Status: None   Collection Time: 06/08/19  9:34 AM  Result Value Ref Range   WBC 4.8 3.8 - 10.8 Thousand/uL   RBC 4.36 3.80 - 5.10 Million/uL   Hemoglobin 13.2 11.7 - 15.5 g/dL   HCT 39.1 35.0 - 45.0 %   MCV 89.7 80.0 - 100.0 fL   MCH 30.3 27.0 - 33.0 pg   MCHC 33.8 32.0 - 36.0 g/dL   RDW 12.0 11.0 - 15.0 %   Platelets 277 140 - 400 Thousand/uL   MPV 11.2 7.5 - 12.5 fL   Neutro Abs 2,971 1,500 - 7,800 cells/uL   Lymphs Abs 1,454 850 - 3,900 cells/uL   Absolute Monocytes 317 200 - 950 cells/uL   Eosinophils Absolute 29 15 - 500 cells/uL   Basophils Absolute 29 0 - 200 cells/uL   Neutrophils Relative % 61.9 %   Total Lymphocyte 30.3 %   Monocytes Relative 6.6 %   Eosinophils Relative 0.6 %   Basophils Relative 0.6 %   Antibody Screen NO ANTIBODIES DETECTED     Comment:                 Reference range                 No antibodies detected . This assay is a screening test for the detection of  red blood cell antibodies. The test is not to be used  for pretransfusion screening or for the medical  management of an alloimmunized pregnancy.        ABO Grouping A    Rh Type RH(D) POSITIVE    RPR  Ser Ql NON-REACTIVE NON-REACTI   Hepatitis B Surface Ag NON-REACTIVE NON-REACTI   Rubella 3.48 Index    Comment:     Index            Interpretation     -----            --------------       <0.90            Not consistent with immunity      0.90-0.99        Equivocal     > or = 1.00      Consistent with immunity  . The presence of rubella IgG antibody suggests  immunization or past or current infection with rubella virus. . For additional information, please refer to  http://education.[facility].com/faq/FAQ111  (This link is being provided for informational/ educational purposes only.)   HIV antibody (with reflex)     Status: None   Collection Time: 06/08/19  9:34 AM  Result Value Ref Range   HIV 1&2 Ab, 4th Generation NON-REACTIVE NON-REACTI    Comment: HIV-1 antigen and HIV-1/HIV-2 antibodies were not detected. There is no laboratory evidence of HIV infection. Marland Kitchen PLEASE NOTE: This information has been disclosed to you from records whose confidentiality may be protected by state law.  If your state requires such protection, then the state law prohibits you from making any further disclosure of the information without the specific written consent of the person to whom it pertains, or as otherwise permitted by law. A general authorization for the release of medical or other information is NOT sufficient for this purpose. . For additional information please refer to http://education.[facility].com/faq/FAQ106 (This link is being provided for informational/ educational purposes only.) . Marland Kitchen The performance of this assay has not been clinically validated in patients less than 79 years old. .   Hepatitis C Antibody     Status: None   Collection Time: 06/08/19  9:34 AM  Result Value Ref Range   Hepatitis C Ab NON-REACTIVE NON-REACTI   SIGNAL TO CUT-OFF 0.01 <1.00    Comment: . HCV antibody was non-reactive. There is no laboratory  evidence of HCV infection. . In most cases, no further action is required. However, if recent HCV exposure is suspected, a test for HCV RNA (test code [id number]) is suggested. . For additional information please refer  to http://education.[facility].com/faq/FAQ22v1 (This link is being provided for informational/ educational purposes only.) .   HgB A1c     Status: None   Collection Time: 06/08/19  9:34 AM  Result Value Ref Range   Hgb A1c MFr Bld 4.7 <5.7 % of total Hgb    Comment: For the purpose of screening for the presence of diabetes: . <5.7%       Consistent with the absence of diabetes 5.7-6.4%    Consistent with increased risk for diabetes             (prediabetes) > or =6.5%  Consistent with diabetes . This assay result is consistent with a decreased risk of diabetes. . Currently, no consensus exists regarding use of hemoglobin A1c for diagnosis of diabetes in children. . According to American Diabetes Association (ADA) guidelines, hemoglobin A1c <7.0% represents optimal control in non-pregnant diabetic patients. Different metrics may apply to specific patient populations.  Standards of Medical Care in Diabetes(ADA). .    Mean Plasma Glucose 88 (calc)   eAG (mmol/L) 4.9 (calc)  Antinuclear Antib (ANA)     Status: Abnormal   Collection Time: 06/08/19  9:41 AM  Result Value Ref Range   Anti Nuclear Antibody (ANA) POSITIVE (A) NEGATIVE    Comment: ANA IFA is a first line screen for detecting the presence of up to approximately 150 autoantibodies in various autoimmune diseases. A positive ANA IFA result is suggestive of autoimmune disease and reflexes to titer and pattern. Further laboratory testing may be considered if clinically indicated. . For additional information, please refer to http://education.[facility].com/faq/FAQ177 (This link is being provided for informational/ educational purposes only.) .   TSH     Status: None   Collection Time: 06/08/19  9:41 AM  Result Value Ref Range   TSH 1.16 mIU/L    Comment:           Reference Range .           > or = 20 Years  0.40-4.50 .                Pregnancy Ranges           First trimester    0.26-2.66            Second trimester   0.55-2.73           Third trimester    0.43-2.91   Comp Met (CMET)     Status: None   Collection Time: 06/08/19  9:41 AM  Result Value Ref Range   Glucose, Bld 87 65 - 99 mg/dL    Comment: .            Fasting reference interval .    BUN 7 7 - 25 mg/dL   Creat 0.51 0.50 - 1.10 mg/dL   BUN/Creatinine Ratio NOT APPLICABLE 6 - 22 (calc)   Sodium 137 135 - 146 mmol/L   Potassium 4.1 3.5 - 5.3 mmol/L   Chloride 106 98 - 110 mmol/L   CO2 23 20 - 32 mmol/L   Calcium 8.9 8.6 - 10.2 mg/dL   Total Protein 6.8 6.1 - 8.1 g/dL   Albumin 4.2 3.6 - 5.1 g/dL   Globulin 2.6 1.9 - 3.7 g/dL (calc)   AG Ratio 1.6 1.0 - 2.5 (calc)   Total Bilirubin 0.5 0.2 - 1.2 mg/dL   Alkaline phosphatase (APISO) 47 31 - 125 U/L   AST 14 10 - 30 U/L   ALT 17 6 - 29 U/L  Anti-nuclear ab-titer (ANA titer)     Status: Abnormal   Collection Time: 06/08/19  9:41 AM  Result Value Ref Range   ANA Titer 1 1:40 (H) titer    Comment: A low level ANA titer may be present in pre-clinical autoimmune diseases and normal individuals.                 Reference Range                 <1:40        Negative                 1:40-1:80    Low Antibody Level                 >1:80        Elevated Antibody Level .    ANA Pattern 1 Nuclear, Speckled (A)     Comment: Speckled pattern is associated with mixed connective tissue disease (MCTD), systemic lupus erythematosus (SLE), Sjogren's syndrome, dermatomyositis, and  systemic sclerosis/polymyositis overlap. . AC-2,4,5,29: Speckled . International Consensus on ANA Patterns (https://www.hernandez-brewer.com/)   Urinalysis, Routine w reflex microscopic  Status: Abnormal   Collection Time: 06/13/19  1:08 PM  Result Value Ref Range   Color, Urine STRAW (A) YELLOW   APPearance CLEAR CLEAR   Specific Gravity, Urine 1.008 1.005 - 1.030   pH 7.0 5.0 - 8.0   Glucose, UA NEGATIVE NEGATIVE mg/dL   Hgb urine dipstick NEGATIVE NEGATIVE   Bilirubin  Urine NEGATIVE NEGATIVE   Ketones, ur NEGATIVE NEGATIVE mg/dL   Protein, ur NEGATIVE NEGATIVE mg/dL   Nitrite NEGATIVE NEGATIVE   Leukocytes,Ua NEGATIVE NEGATIVE    Comment: Performed at [facility] [address], [place], [place] [address]  Alpha fetoprotein, maternal     Status: None   Collection Time: 07/23/19  3:55 PM  Result Value Ref Range   Interpretation      Comment: . Screen negative for open NTD. Marland Kitchen    Risk for ONTD <1 IN 5000    AFP, Serum 21.5 ng/mL   AFP MoM 0.67    Comments:      Comment: . This patient's EDD (estimated date of delivery) was used to calculate the gestational age. . . Performance of maternal AFP provides a useful screening test for detection of open neural tube defects. The single AFP marker is not recommended for Down syndrome and other chromosomal abnormalities screening. Much greater sensitivity is achieved with multiple markers, such as AFP, hCG, unconjugated estriol, and/or dimeric inhibin A, as recommended by the [place] and Gynecology.  It should be noted that normal test results can never guarantee the birth of a normal baby and that 2-3% of newborns have some type of physical or mental defect, many of which are undetectable through any known prenatal diagnostic technique. .    Comment      Comment: . This is a screening test, not a diagnostic test. This risk assessment report is based in part on demographic data provided by the ordering physician. Please notify the laboratory promptly if any data are incorrect. For assistance with recalculations, please call your local [place] laboratory. For assistance with interpretation of these results, please contact your [place] genetic counselor or call [phone]). . Interpretive Cutoffs Screen Positive for Open NTD: > or = 2.50 adjusted MOM                               > or = 1.90 adjusted MOM for                                Insulin-dependent diabetics                               > or = 4.00 adjusted MOM for                               twins                               > or = 3.50 adjusted MOM for                               Twins insulin-dependent  diabetics                               > or = 4.50 adjusted MOM for                                triplets .    Calc'd Gestational Age 43.0 weeks   Maternal Wt 196 lbs   Est Due Date 12/31/2019    EDD Determined by ULTRASOUND    Mother's ethnic origin [place]IAN    Twins-AFP 1    Maternal IDDM NO    Repeat Sample NO    Brief History (NTD) NO    Previous pregnancy Down Syndrome NO    Pregnancy Donor Egg NO    Donor age: egg retrieval NOT GIVEN   Sjogrens syndrome-A extractable nuclear antibody     Status: None   Collection Time: 07/23/19  3:55 PM  Result Value Ref Range   SSA (Ro) (ENA) Antibody, IgG <1.0 NEG <1.0 NEG AI  Sjogrens syndrome-B extractable nuclear antibody     Status: None   Collection Time: 07/23/19  3:55 PM  Result Value Ref Range   SSB (La) (ENA) Antibody, IgG <1.0 NEG <1.0 NEG AI  Urinalysis, Routine w reflex microscopic     Status: Abnormal   Collection Time: 08/24/19  2:21 PM  Result Value Ref Range   Color, Urine STRAW (A) YELLOW   APPearance CLEAR CLEAR   Specific Gravity, Urine 1.004 (L) 1.005 - 1.030   pH 7.0 5.0 - 8.0   Glucose, UA NEGATIVE NEGATIVE mg/dL   Hgb urine dipstick NEGATIVE NEGATIVE   Bilirubin Urine NEGATIVE NEGATIVE   Ketones, ur NEGATIVE NEGATIVE mg/dL   Protein, ur NEGATIVE NEGATIVE mg/dL   Nitrite NEGATIVE NEGATIVE   Leukocytes,Ua NEGATIVE NEGATIVE    Comment: Performed at [facility], [address], [place], [place] [address]     Review of Systems  Constitutional: Negative for fever.  Genitourinary: Positive for pelvic pain. Negative for dysuria, vaginal bleeding and vaginal discharge.   Physical Exam   Blood pressure 107/70, pulse 80,  temperature 98.8 F (37.1 C), temperature source Oral, resp. rate 18, height '5\' 7"'  (1.702 m), weight 91.8 kg, last menstrual period 03/26/2019, SpO2 99 %, unknown if currently breastfeeding.   No data found.  Physical Exam  Constitutional: She is oriented to person, place, and time. She appears well-developed and well-nourished. No distress.  HENT:  Head: Normocephalic.  Respiratory: Effort normal.  GI: There is no CVA tenderness.  Genitourinary:    Genitourinary Comments: Dilation: Closed Effacement (%): Thick Exam by:: Altamease Oiler, NP   Musculoskeletal:        General: Normal range of motion.  Neurological: She is alert and oriented to person, place, and time.  Skin: Skin is warm. She is not diaphoretic.  Psychiatric: Her behavior is normal.    MAU Course  Procedures  None  MDM  + fetal heart tones via doppler Pulse ox 100 % on RA   Assessment and Plan   A:    ICD-10-CM   1. Pain of round ligament affecting pregnancy, antepartum  O26.899 Discharge patient   R10.2   2. Back pain in pregnancy  O99.891 Discharge patient   M54.9   3. Short interval between pregnancies affecting pregnancy, antepartum  O09.899 Discharge patient  4. [redacted] weeks gestation of pregnancy  Z3A.21 Discharge patient  5. Supervision of other normal pregnancy, antepartum  Z34.80 Discharge patient    P:  Discharged home in stable condition  Encouraged regular use of pregnancy support belt.  Rx: Flexeril Return to MAU if symptoms worsen F/u with ob, stable for discharge   Jovanie Verge, Artist Pais, NP 08/26/2019 2:57 PM   Noni Saupe 08/24/2019, 3:02 PM

## 2019-09-01 ENCOUNTER — Ambulatory Visit: Payer: Managed Care, Other (non HMO) | Attending: Obstetrics and Gynecology

## 2019-09-01 ENCOUNTER — Other Ambulatory Visit: Payer: Self-pay | Admitting: *Deleted

## 2019-09-01 ENCOUNTER — Other Ambulatory Visit: Payer: Self-pay

## 2019-09-01 ENCOUNTER — Ambulatory Visit: Payer: Managed Care, Other (non HMO) | Admitting: *Deleted

## 2019-09-01 VITALS — BP 119/79 | HR 89

## 2019-09-01 DIAGNOSIS — O0990 Supervision of high risk pregnancy, unspecified, unspecified trimester: Secondary | ICD-10-CM | POA: Insufficient documentation

## 2019-09-01 DIAGNOSIS — O99412 Diseases of the circulatory system complicating pregnancy, second trimester: Secondary | ICD-10-CM | POA: Diagnosis not present

## 2019-09-01 DIAGNOSIS — Z3A22 22 weeks gestation of pregnancy: Secondary | ICD-10-CM | POA: Diagnosis not present

## 2019-09-01 DIAGNOSIS — O34219 Maternal care for unspecified type scar from previous cesarean delivery: Secondary | ICD-10-CM | POA: Diagnosis not present

## 2019-09-01 DIAGNOSIS — Z3689 Encounter for other specified antenatal screening: Secondary | ICD-10-CM

## 2019-09-01 DIAGNOSIS — O09219 Supervision of pregnancy with history of pre-term labor, unspecified trimester: Secondary | ICD-10-CM | POA: Diagnosis not present

## 2019-09-01 DIAGNOSIS — M3500 Sicca syndrome, unspecified: Secondary | ICD-10-CM | POA: Diagnosis not present

## 2019-09-01 DIAGNOSIS — Z3480 Encounter for supervision of other normal pregnancy, unspecified trimester: Secondary | ICD-10-CM | POA: Diagnosis present

## 2019-09-02 NOTE — BH Specialist Note (Signed)
Integrated Behavioral Health via Telemedicine Video (Caregility) Visit  09/02/2019 Laura [name] [id number]  Number of [place] visits: 1 (2 total) Session Start time: 8:47  Session End time: 9:29 Total time: 12  Referring Provider: Mora Bellman, MD Type of Visit: Video Patient/Family location: Home Laura [name] Provider location: [facility]  All persons participating in visit: Patient Laura [name] and Laura [name]    Confirmed patient's address: Yes  Confirmed patient's phone number: Yes  Any changes to demographics: No   Confirmed patient's insurance: Yes  Any changes to patient's insurance: No   Discussed confidentiality: Yes   I connected with Laura Worthingtonication (Laura [name]) and verified that I am speaking with the correct person using two identifiers.     I discussed the limitations of evaluation and management by telemedicine and the availability of in person appointments.  I discussed that the purpose of this visit is to provide behavioral health care while limiting exposure to the novel coronavirus.   Discussed there is a possibility of technology failure and discussed alternative modes of communication if that failure occurs.  I discussed that engaging in this virtual visit, they consent to the provision of behavioral healthcare and the services will be billed under their insurance.  Patient and/or legal guardian expressed understanding and consented to virtual visit: Yes   PRESENTING CONCERNS: Patient and/or family reports the following symptoms/concerns: Pt states her primary concern today is having panic attacks in current pregnancy,including ED visit for panic that lasted over one hour;  pt stopped taking Celexa, as it made her feel "empty and dull"; requests medication for anxiety only as needed, if panic begins. Pt uses self-coping strategies (grounding exercise and  relaxation breathing); open to learning additional coping strategy today. Duration of problem: Increase in current pregnancy; Severity of problem: moderately severe  STRENGTHS (Protective Factors/Coping Skills): Open to treatment and learning new self-coping skills; has self-coping strategies she uses as needed (grounding, relaxation breathing, journalling); Good social support  GOALS ADDRESSED: Patient will: 1.  Reduce symptoms of: anxiety  2.  Increase knowledge and/or ability of: self-management skills  3.  Demonstrate ability to: Increase healthy adjustment to current life circumstances and Increase motivation to adhere to plan of care  INTERVENTIONS: Interventions utilized:  Brief CBT and Psychoeducation and/or Health Education Standardized Assessments completed: GAD-7 and PHQ 9  ASSESSMENT: Patient currently experiencing Anxiety disorder, unspecified .   Patient may benefit from psychoeducation and brief therapeutic interventions regarding coping with symptoms of anxiety  .  PLAN: 1. Follow up with behavioral health clinician on : Three weeks. Call Laura [name] at [phone] if needed prior to scheduled visit 2. Behavioral recommendations:  -Continue taking prenatal vitamin daily -Laura [name] or medical staff will contact with any changes to medication -Continue using self-coping strategies that have helped in the past (grounding exercise: 5 things you see, 4 things you hear, 3 things you feel, 2 things you smell, 1 thing you taste; relaxation breathing) -Begin Worry Time strategy tomorrow; implement daily, as discussed  3. Referral(s): Laura [name] (In Clinic)  I discussed the assessment and treatment plan with the patient and/or parent/guardian. They were provided an opportunity to ask questions and all were answered. They agreed with the plan and demonstrated an understanding of the instructions.   They were advised to call back or seek an in-person evaluation  if the symptoms worsen or if the condition fails to improve as anticipated.  Laura [name] Laura [name]  Depression screen [facility] 2/9 09/04/2019 10/31/2017 06/30/2015  Decreased Interest 0 2 0  Down, Depressed, Hopeless 0 2 0  PHQ - 2 Score 0 4 0  Altered sleeping 2 1 -  Tired, decreased energy 3 1 -  Change in appetite 0 1 -  Feeling bad or failure about yourself  1 0 -  Trouble concentrating 3 2 -  Moving slowly or fidgety/restless 0 1 -  Suicidal thoughts 0 0 -  PHQ-9 Score 9 10 -   GAD 7 : Generalized Anxiety Score 09/04/2019 10/31/2017  Nervous, Anxious, on Edge 3 3  Control/stop worrying 2 2  Worry too much - different things 2 3  Trouble relaxing 1 3  Restless 0 2  Easily annoyed or irritable 2 2  Afraid - awful might happen 2 3  Total GAD 7 Score 12 18

## 2019-09-04 ENCOUNTER — Ambulatory Visit (INDEPENDENT_AMBULATORY_CARE_PROVIDER_SITE_OTHER): Payer: Medicaid Other | Admitting: Clinical

## 2019-09-04 DIAGNOSIS — F419 Anxiety disorder, unspecified: Secondary | ICD-10-CM | POA: Diagnosis not present

## 2019-09-04 MED ORDER — HYDROXYZINE HCL 50 MG PO TABS: 50.0000 mg | ORAL_TABLET | Freq: Three times a day (TID) | ORAL | 2 refills | Status: DC | PRN

## 2019-09-04 NOTE — Progress Notes (Signed)
Vistaril prescribed as needed for anxiety.  Message sent to patient.  Jaynie Collins, MD

## 2019-09-04 NOTE — Patient Instructions (Signed)
   [email or web address]   [facility] [address] [place], [place] [address] [phone] (main office) [phone] (Kenyatta Gloeckner's office)

## 2019-09-04 NOTE — Addendum Note (Signed)
Addended by: Jaynie Collins A on: 09/04/2019 02:42 PM   Modules accepted: Orders

## 2019-09-10 IMAGING — US US MFM OB COMP +14 WKS
1 series · 14 of 28 positions shown · non-contrast
Comparison: none

[Series 1: us mfm ob comp +14 wks · 103 acquisitions, 14 frames shown]
[im 4/103]
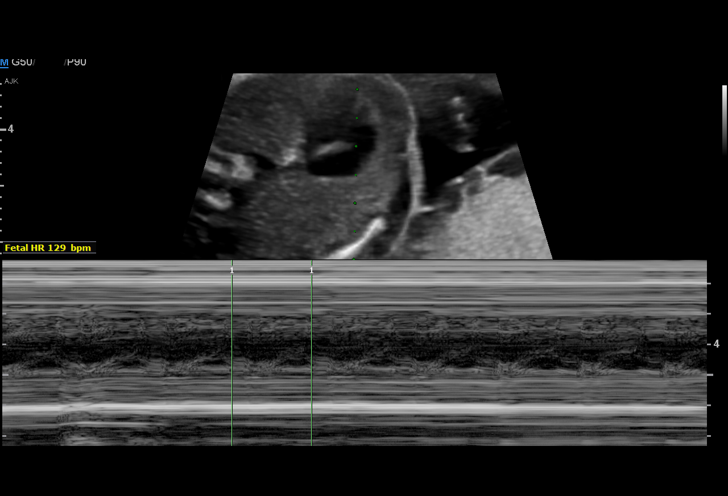
[im 12/103]
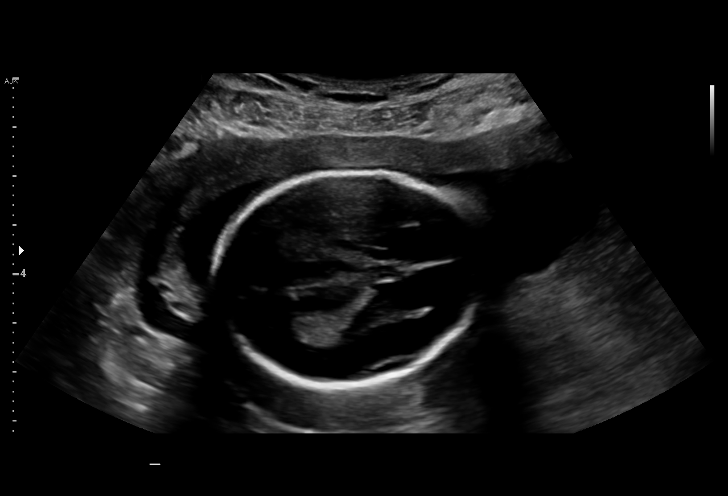
[im 19/103]
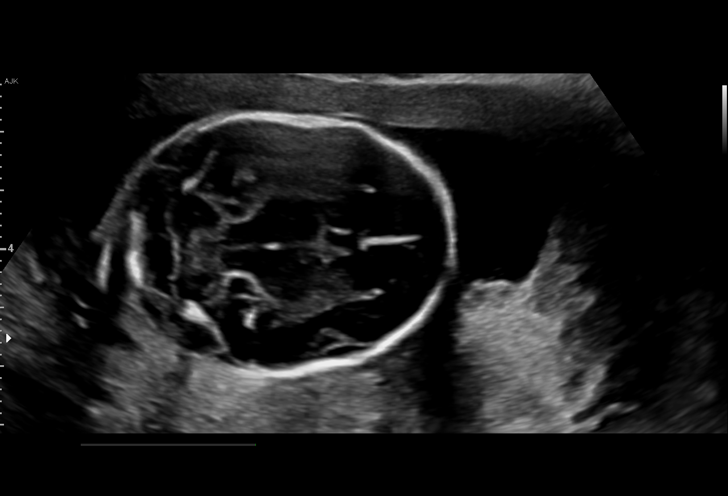
[im 27/103]
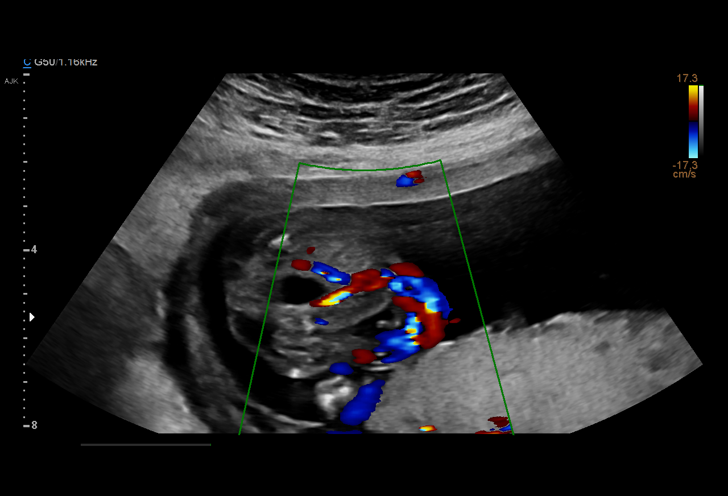
[im 35/103]
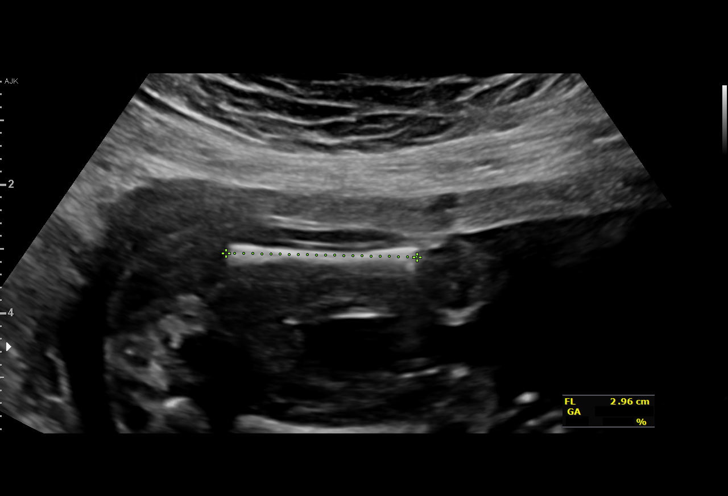
[im 42/103]
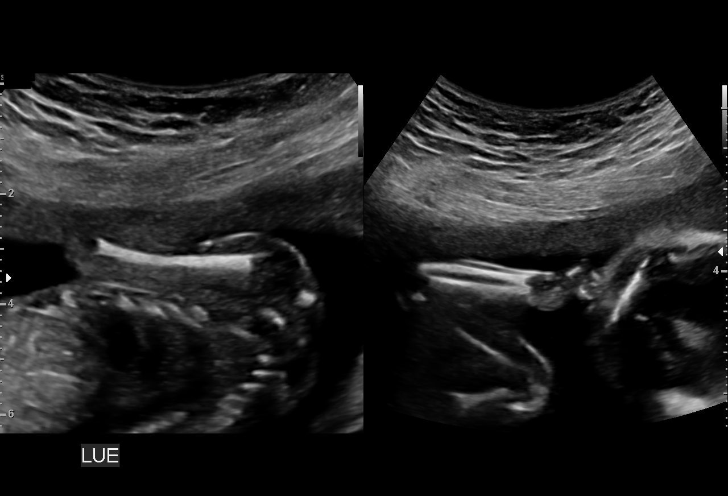
[im 50/103]
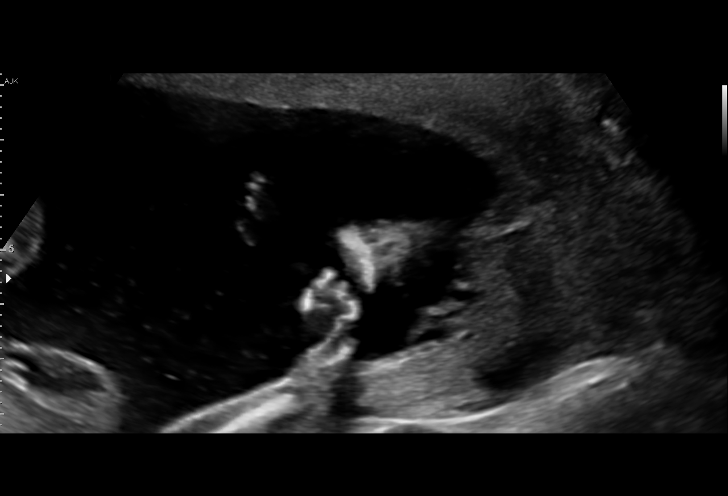
[im 57/103]
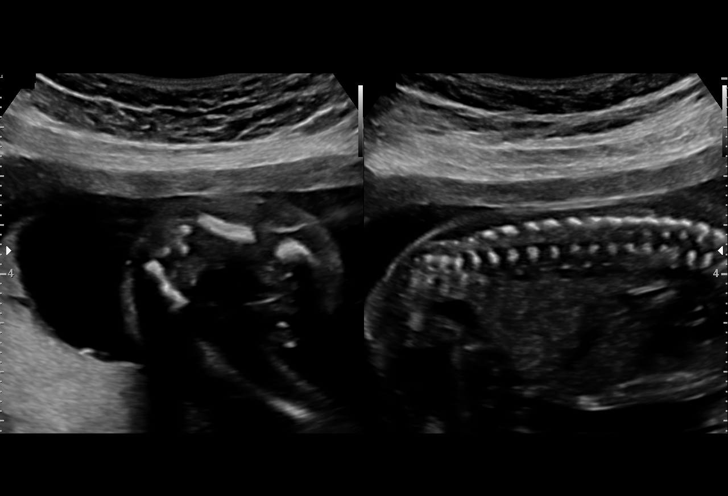
[im 65/103]
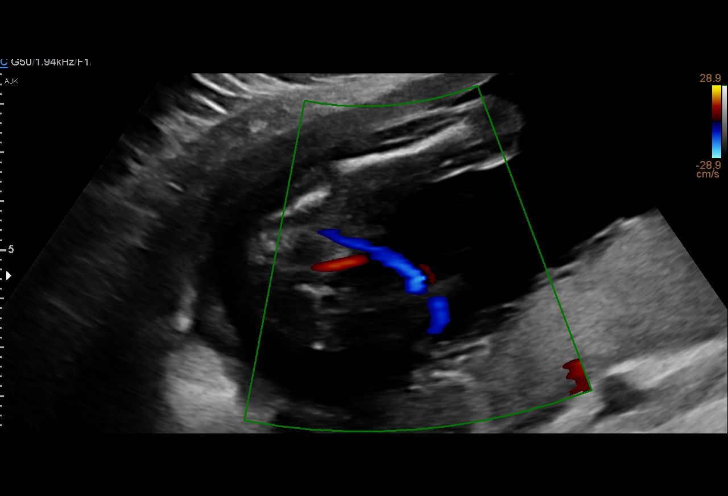
[im 72/103]
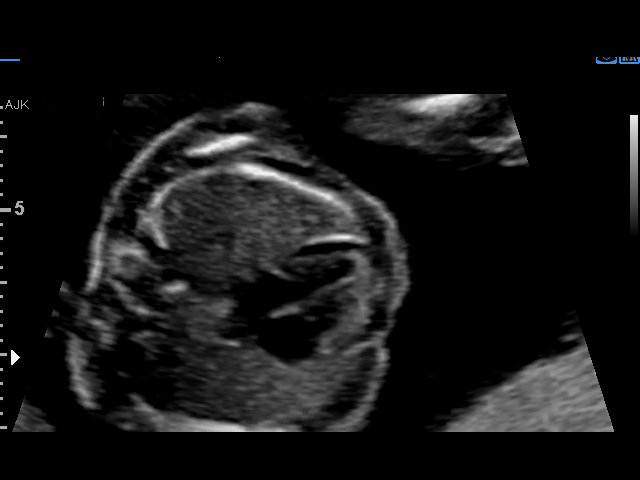
[im 80/103]
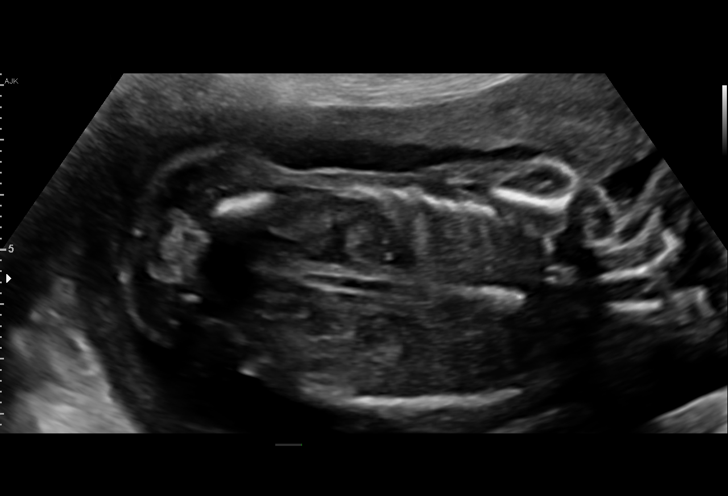
[im 87/103]
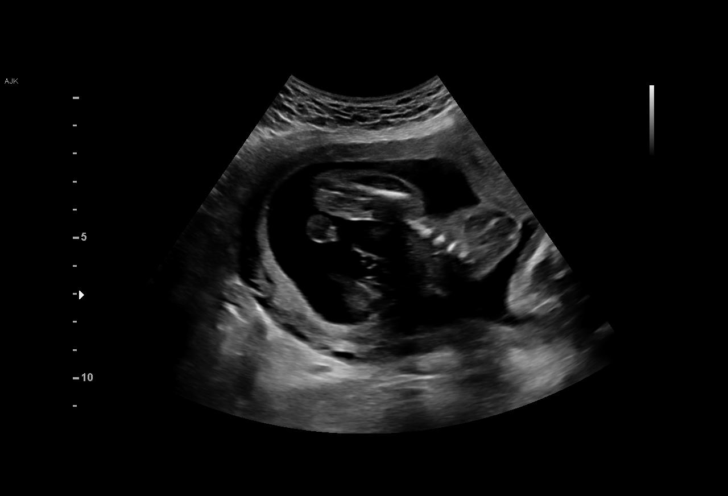
[im 95/103]
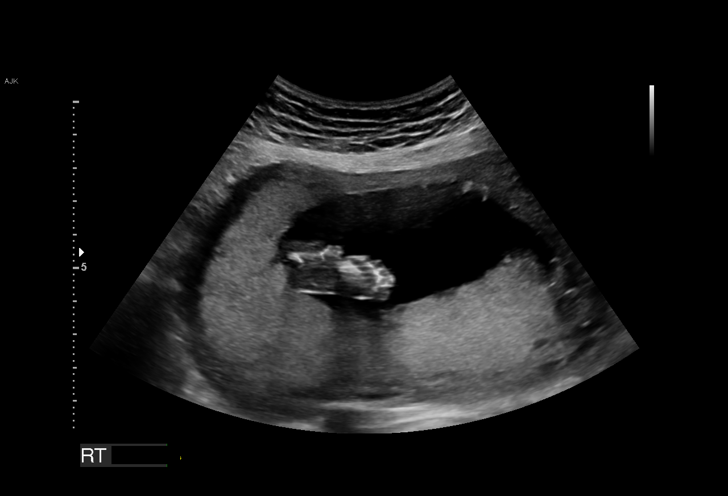
[im 103/103]
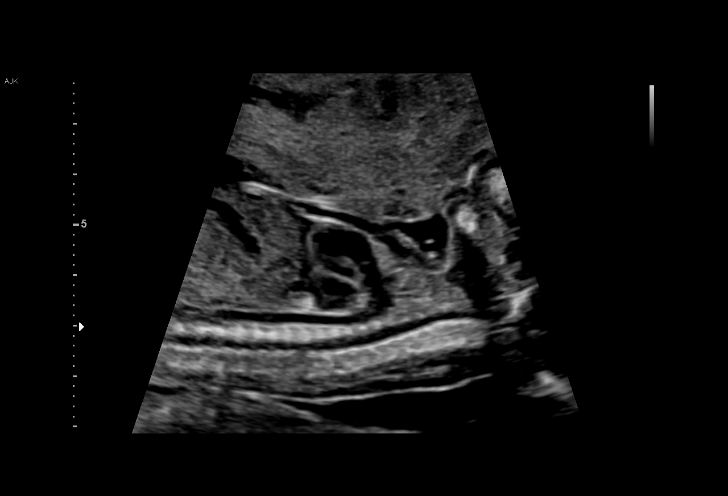

[14 of 28 positions shown; findings below may reference images not displayed]

VARGAS NISHINOHARA CNM

1  US MFM OB COMP + 14 WK               76805.01     ANNLYN OMPRAKASH

Indications

25 weeks gestation of pregnancy
Encounter for antenatal screening for
malformations
Previous cesarean delivery, antepartum
Vital Signs

BMI:
Fetal Evaluation

Num Of Fetuses:         1
Fetal Heart Rate(bpm):  129
Cardiac Activity:       Observed
Presentation:           Cephalic
Placenta:               Posterior
P. Cord Insertion:      Visualized, central

Amniotic Fluid
AFI FV:      Within normal limits

Largest Pocket(cm)
5.38
Biometry

BPD:      43.5  mm     G. Age:  19w 1d        < 1  %    CI:        69.05   %    70 - 86
FL/HC:      17.9   %    18.6 -
HC:      167.2  mm     G. Age:  19w 3d        < 3  %    HC/AC:      1.12        1.04 -
AC:      148.8  mm     G. Age:  20w 1d        < 3  %    FL/BPD:     69.0   %    71 - 87
FL:         30  mm     G. Age:  19w 2d        < 3  %    FL/AC:      20.2   %    20 - 24
HUM:      28.8  mm     G. Age:  19w 2d        < 5  %
CER:      19.6  mm     G. Age:  18w 6d        < 5  %
NFT:       4.6  mm

LV:        5.7  mm
CM:        5.4  mm

Est. FW:     307  gm    0 lb 11 oz    < 10  %
OB History

Gravidity:    6         Term:   2        Prem:   0        SAB:   2
TOP:          0       Ectopic:  0        Living: 2
Gestational Age

Clinical EDD:  25w 4d                                        EDD:   02/24/18
U/S Today:     19w 4d                                        EDD:   04/07/18
Best:          25w 4d     Det. By:  Clinical EDD             EDD:   02/24/18
Anatomy

Cranium:               Appears normal         LVOT:                   Appears normal
Cavum:                 Appears normal         Aortic Arch:            Appears normal
Ventricles:            Appears normal         Ductal Arch:            Appears normal
Choroid Plexus:        Appears normal         Diaphragm:              Appears normal
Cerebellum:            Appears normal         Stomach:                Appears normal, left
sided
Posterior Fossa:       Appears normal         Abdomen:                Appears normal
Nuchal Fold:           Appears normal         Abdominal Wall:         Appears nml (cord
insert, abd wall)
Face:                  Appears normal         Cord Vessels:           Appears normal (3
(orbits and profile)                           vessel cord)
Lips:                  Appears normal         Kidneys:                Appear normal
Palate:                Appears normal         Bladder:                Appears normal
Thoracic:              Appears normal         Spine:                  Appears normal
Heart:                 Appears normal         Upper Extremities:      Appears normal
(4CH, axis, and
situs)
RVOT:                  Appears normal         Lower Extremities:      Appears normal

Other:  Fetus appears to be a male. Heels and feet visualized. Nasal bone
visualized. Technically difficult due to fetal position.
Cervix Uterus Adnexa

Cervix
Length:           4.26  cm.
Normal appearance by transabdominal scan.

Uterus
No abnormality visualized.

Left Ovary
Not visualized.

Right Ovary
Not visualized.

Adnexa
No abnormality visualized.
Impression

We performed fetal anatomy scan. No makers of
aneuploidies or fetal structural defects are seen. Fetal
biometry is consistent with her previously-established dates.
Amniotic fluid is normal and good fetal activity is seen.
Recommendations

Follow-up scans as clinically indicated.

## 2019-09-14 ENCOUNTER — Other Ambulatory Visit: Payer: Self-pay

## 2019-09-14 ENCOUNTER — Ambulatory Visit (INDEPENDENT_AMBULATORY_CARE_PROVIDER_SITE_OTHER): Payer: Managed Care, Other (non HMO) | Admitting: Obstetrics & Gynecology

## 2019-09-14 VITALS — BP 108/69 | HR 69 | Wt 211.0 lb

## 2019-09-14 DIAGNOSIS — M5442 Lumbago with sciatica, left side: Secondary | ICD-10-CM

## 2019-09-14 DIAGNOSIS — M5441 Lumbago with sciatica, right side: Secondary | ICD-10-CM

## 2019-09-14 DIAGNOSIS — Z3480 Encounter for supervision of other normal pregnancy, unspecified trimester: Secondary | ICD-10-CM

## 2019-09-14 DIAGNOSIS — Z3A24 24 weeks gestation of pregnancy: Secondary | ICD-10-CM

## 2019-09-14 DIAGNOSIS — F419 Anxiety disorder, unspecified: Secondary | ICD-10-CM

## 2019-09-14 DIAGNOSIS — G8929 Other chronic pain: Secondary | ICD-10-CM

## 2019-09-14 DIAGNOSIS — O99891 Other specified diseases and conditions complicating pregnancy: Secondary | ICD-10-CM

## 2019-09-14 DIAGNOSIS — O34219 Maternal care for unspecified type scar from previous cesarean delivery: Secondary | ICD-10-CM

## 2019-09-14 DIAGNOSIS — M3500 Sicca syndrome, unspecified: Secondary | ICD-10-CM

## 2019-09-14 DIAGNOSIS — O99342 Other mental disorders complicating pregnancy, second trimester: Secondary | ICD-10-CM

## 2019-09-14 MED ORDER — SERTRALINE HCL 50 MG PO TABS
50.0000 mg | ORAL_TABLET | Freq: Every day | ORAL | 4 refills | Status: DC
Start: 2019-09-14 — End: 2021-10-03

## 2019-09-14 NOTE — Progress Notes (Signed)
   PRENATAL VISIT NOTE  Subjective:  Laura [name] is a 24 y.o. [phone] at [redacted]w[redacted]d being seen today for ongoing prenatal care.  She is currently monitored for the following issues for this high-risk pregnancy and has Recurrent pregnancy loss; History of postpartum depression, currently pregnant; History of cesarean delivery, antepartum; Supervision of other normal pregnancy, antepartum; ANA positive; Psoriasis; and Sjogren's syndrome (HCC) on their problem list.  Patient reports continued chronic back and pelvic pain, anxiety with panic attacks.  Atarax not working.  Has been on Celexa but mad e her tired.  .  Contractions: Irritability. Vag. Bleeding: None.  Movement: Present. Denies leaking of fluid.   The following portions of the patient's history were reviewed and updated as appropriate: allergies, current medications, past family history, past medical history, past social history, past surgical history and problem list.   Objective:   Vitals:   09/14/19 1146  BP: 108/69  Pulse: 69  Weight: 211 lb (95.7 kg)    Fetal Status:     Movement: Present     General:  Alert, oriented and cooperative. Patient is in no acute distress.  Skin: Skin is warm and dry. No rash noted.   Cardiovascular: Normal heart rate noted  Respiratory: Normal respiratory effort, no problems with respiration noted  Abdomen: Soft, gravid, appropriate for gestational age.  Pain/Pressure: Present     Pelvic: Cervical exam deferred        Extremities: Normal range of motion.  Edema: Trace  Mental Status: Normal mood and affect. Normal behavior. Normal judgment and thought content.   Assessment and Plan:  Pregnancy: [id number] at [redacted]w[redacted]d 1. Supervision of other normal pregnancy, antepartum BTL consent (Medicaid as second payer).  2. Sjogren's syndrome, with unspecified organ involvement (HCC) Serial growth [place] with MFM; normal thus far; Ro and La negative  3. History of cesarean delivery, antepartum Patient  thought she wanted to [facility] but now is leaning towards repeat scheduled cesarean section and bilateral tubal ligation BTL papers signed today.  4.  Chronic back and pelvic pain: Referral to pelvic PT.  Work is not accommodating her so will write her out of work with STD.  She has tried conservative measures without success.  5.  Anxiety: Vistaril not working.  Will try Zoloft 50 mg daily.  Pt has been on Celexa in the past and familiar with drug class.    Preterm labor symptoms and general obstetric precautions including but not limited to vaginal bleeding, contractions, leaking of fluid and fetal movement were reviewed in detail with the patient. Please refer to After Visit Summary for other counseling recommendations.   [facility]  Future Appointments  Date Time Provider Department Center  09/29/2019  8:45 AM [facility] HEALTH CLINICIAN [facility]  10/13/2019  9:45 AM [facility] NURSE [facility]  10/13/2019  9:45 AM [facility] US5 [facility]    Elsie Lincoln, MD

## 2019-09-15 NOTE — BH Specialist Note (Signed)
Integrated Behavioral Health via Telemedicine Video (Caregility) Visit  09/15/2019 Laura [name] [id number]  Number of Integrated Behavioral Health visits: 2 (3 total) Session Start time: 8:45  Session End time: 9:12 Total time: 27  Referring Provider: Catalina Antigua, MD Type of Visit: Video Patient/Family location: Home [facility] Provider location: Center for [facility] at [facility]  All persons participating in visit: Patient Laura [name] and [facility] Haruo Stepanek    Confirmed patient's address: Yes  Confirmed patient's phone number: Yes  Any changes to demographics: No   Confirmed patient's insurance: Yes  Any changes to patient's insurance: No   Discussed confidentiality: at previous visit  I connected with Laura [name]  by a video enabled telemedicine application (Caregility) and verified that I am speaking with the correct person using two identifiers.     I discussed the limitations of evaluation and management by telemedicine and the availability of in person appointments.  I discussed that the purpose of this visit is to provide behavioral health care while limiting exposure to the novel coronavirus.   Discussed there is a possibility of technology failure and discussed alternative modes of communication if that failure occurs.  I discussed that engaging in this virtual visit, they consent to the provision of behavioral healthcare and the services will be billed under their insurance.  Patient and/or legal guardian expressed understanding and consented to virtual visit: Yes   PRESENTING CONCERNS: Patient and/or family reports the following symptoms/concerns: Pt states that her primary goal is to continue to reduce symptoms of anxiety throughout pregnancy and postpartum; is taking BH medication, using self-coping strategies, and is on leave from work during remainder of pregnancy.  Duration of problem: Increase in current pregnancy;  Severity of problem: mild  STRENGTHS (Protective Factors/Coping Skills): Good social support, uses self-coping strategies when needed (grounding, relaxation breathing, journal); adhering to [facility] medication management  GOALS ADDRESSED: Patient will: 1.  Maintain reduction of symptoms of: anxiety and depression  2.  Demonstrate ability to: Increase motivation to adhere to plan of care  INTERVENTIONS: Interventions utilized:  Medication Monitoring and Psychoeducation and/or Health Education Standardized Assessments completed: GAD-7 and PHQ 9  ASSESSMENT: Patient currently experiencing Anxiety disorder, unspecified.   Patient may benefit from continued psychoeducation and brief therapeutic interventions regarding coping with symptoms of anxiety and depression .  PLAN: 1. Follow up with behavioral health clinician on : One month 2. Behavioral recommendations:  -Continue taking BH medication as prescribed (Zoloft daily and Vistaril as needed) -Continue taking prenatal vitamin daily -Continue using daily self-coping strategies that have remained helpful  3. Referral(s): Integrated [employer] (In Clinic)  I discussed the assessment and treatment plan with the patient and/or parent/guardian. They were provided an opportunity to ask questions and all were answered. They agreed with the plan and demonstrated an understanding of the instructions.   They were advised to call back or seek an in-person evaluation if the symptoms worsen or if the condition fails to improve as anticipated.  Laura [name] Laura [name]  Depression screen [facility] 2/9 09/29/2019 09/04/2019 10/31/2017 06/30/2015  Decreased Interest 1 0 2 0  Down, Depressed, Hopeless 0 0 2 0  PHQ - 2 Score 1 0 4 0  Altered sleeping 3 2 1  -  Tired, decreased energy 3 3 1  -  Change in appetite 0 0 1 -  Feeling bad or failure about yourself  0 1 0 -  Trouble concentrating 0 3 2 -  Moving slowly or fidgety/restless 0  0 1 -  Suicidal  thoughts 0 0 0 -  PHQ-9 Score 7 9 10  -   GAD 7 : Generalized Anxiety Score 09/29/2019 09/04/2019 10/31/2017  Nervous, Anxious, on Edge 0 3 3  Control/stop worrying 1 2 2   Worry too much - different things 0 2 3  Trouble relaxing 0 1 3  Restless 0 0 2  Easily annoyed or irritable 0 2 2  Afraid - awful might happen 0 2 3  Total GAD 7 Score 1 12 18

## 2019-09-23 ENCOUNTER — Encounter: Payer: Self-pay | Admitting: Physical Therapy

## 2019-09-23 ENCOUNTER — Other Ambulatory Visit: Payer: Self-pay

## 2019-09-23 ENCOUNTER — Ambulatory Visit: Payer: Managed Care, Other (non HMO) | Attending: Obstetrics & Gynecology | Admitting: Physical Therapy

## 2019-09-23 DIAGNOSIS — M545 Low back pain, unspecified: Secondary | ICD-10-CM

## 2019-09-23 DIAGNOSIS — R102 Pelvic and perineal pain: Secondary | ICD-10-CM | POA: Insufficient documentation

## 2019-09-23 NOTE — Patient Instructions (Signed)
Access Code: [email or web address] URL: [email or web address] Date: 09/23/2019 Prepared by: Eulis Foster  Exercises Hooklying Isometric Hip Flexion - 1 x daily - 7 x weekly - 1 sets - 3 reps - 6 sec hold Cat-Camel - 1 x daily - 7 x weekly - 1 sets - 10 reps [facility] [address] [place], [place] [address] Phone # [phone] Fax [phone]

## 2019-09-23 NOTE — Therapy (Signed)
[facility] [address] [place], [place], [address] Phone: [phone]   Fax:  [phone]  Physical Therapy Evaluation  Patient Details  Name: Laura [name] MRN: [id number] Date of Birth: February 28, 1996 Referring Provider (PT): Dr. Elsie Lincoln   Encounter Date: 09/23/2019   PT End of Session - 09/23/19 1237    Visit Number 1    Date for PT Re-Evaluation 12/16/19    Authorization Type [employer]/ Medicaid    Activity Tolerance Patient tolerated treatment well;No increased pain    Behavior During Therapy WFL for tasks assessed/performed           Past Medical History:  Diagnosis Date  . Anxiety   . Depression    since 12/18 from [facility]AB, PPD  . Hydrocephalus (HCC)   . Meningitis   . PTSD (post-traumatic stress disorder) 2019  . Sjogren's syndrome (HCC)   . Spinal headache     Past Surgical History:  Procedure Laterality Date  . APPENDECTOMY    . CESAREAN SECTION N/A 12/30/2015   Procedure: CESAREAN SECTION;  Surgeon: Adam Phenix, MD;  Location: [facility] BIRTHING SUITES;  Service: Obstetrics;  Laterality: N/A;  . CESAREAN SECTION N/A 04/05/2018   Procedure: CESAREAN SECTION;  Surgeon: Catalina Antigua, MD;  Location: [facility] BIRTHING SUITES;  Service: Obstetrics;  Laterality: N/A;  . CHOLECYSTECTOMY    . spnal tap    . URETHRA SURGERY      There were no vitals filed for this visit.    Subjective Assessment - 09/23/19 1153    Subjective Patient reported  when she was [redacted] weeks along she stared to have pressure.  Started end of April 2021. The pressure is now pain. Feels like a wedge into the pelvis.    Patient Stated Goals reduce pain and improve function    Currently in Pain? Yes    Pain Score 4    high 8/10   Pain Location Back   pubic bone   Pain Orientation Lower    Pain Descriptors / Indicators Sharp    Pain Type Acute pain    Pain Onset More than a month ago    Pain Frequency Constant    Aggravating  Factors  twisting, lifting leg, getting out of bed, going up and down steps    Pain Relieving Factors change position    Multiple Pain Sites No              [facility] PT Assessment - 09/23/19 0001      Assessment   Medical Diagnosis Z34.80 Supervision of othe normal pregnancy antepartum; M54.42, M54.41 Chronic bilateral low back pain with bilateral sciatica    Referring Provider (PT) Dr. Elsie Lincoln    Onset Date/Surgical Date 07/16/19    Prior Therapy none      Posture/Postural Control   Posture/Postural Control Postural limitations    Posture Comments pregnant posture      ROM / Strength   AROM / PROM / Strength AROM;PROM;Strength      Strength   Right Hip Flexion 4/5    Left Hip Flexion 4/5      Palpation   Spinal mobility tightness in the L2-L4     SI assessment  left ilium rotated posteriorly      Special Tests    Special Tests Sacrolliac Tests    Sacroiliac Tests  Pelvic Compression      Pelvic Compression   Findings Positive    Side Left    comment pain  Objective measurements completed on examination: See above findings.     Pelvic Floor Special Questions - 09/23/19 0001    Prior Pregnancies Yes    Number of Pregnancies 7   presently pregnant   Number of C-Sections 2    Number of Vaginal Deliveries 1   6 stitiches   Episiotomy Performed Yes    Urinary Leakage No    Falling out feeling (prolapse) Yes            [facility] Adult PT Treatment/Exercise - 09/23/19 0001      Therapeutic Activites    Therapeutic Activities ADL's    ADL's instruction on using the pillow between knees to move in bed, sit to stand, stand with equal weight on feet      Lumbar Exercises: Supine   Isometric Hip Flexion 5 reps    Isometric Hip Flexion Limitations on theleft to correct pelvis      Lumbar Exercises: Quadruped   Madcat/Old Horse 15 reps      Manual Therapy   Manual Therapy Joint mobilization;Muscle Energy Technique    Joint  Mobilization rotational mobilzation to L2-L4    Muscle Energy Technique correct left ilium                  PT Education - 09/23/19 1226    Education Details Access Code: [email or web address]; how to wear a SI belt; how to move in bed with a pillow between knees and sit to stand    Person(s) Educated Patient    Methods Explanation;Demonstration;Verbal cues;Handout    Comprehension Returned demonstration;Verbalized understanding            PT Short Term Goals - 09/23/19 1320      PT SHORT TERM GOAL #1   Title independent with initial HEP    Time 4    Period Weeks    Status New    Target Date 10/21/19             PT Long Term Goals - 09/23/19 1321      PT LONG TERM GOAL #1   Title independent with advanced HEP    Baseline not educated yet    Time 12    Period Weeks    Status New    Target Date 12/16/19      PT LONG TERM GOAL #2   Title understand ways to move in bed with a pillow between knees to reduce strain on the SI joint    Time 12    Period Weeks    Status New    Target Date 12/16/19      PT LONG TERM GOAL #3   Title understand how to manage pain with modifing her home tasks, reducing single leg stance activities    Baseline not educated yet    Time 12    Period Weeks    Status New    Target Date 12/16/19      PT LONG TERM GOAL #4   Title understand ways to lift her 24 year old child correctly with decreased strain on lumbar and SI joint    Baseline not educated yet    Time 12    Period Weeks    Status New    Target Date 12/16/19      PT LONG TERM GOAL #5   Title pain level stays around 4/10 75% of the time instead of being a 7/10    Baseline spikes up to 7/10 daily with different tasks  Time 12    Period Weeks    Status New    Target Date 12/16/19                  Plan - 09/23/19 1227    Clinical Impression Statement Patient is a 24 year old female with back and pubic pain since end of April 2021. Patient is [redacted] weeks along in her  pregnancy. Patient reports her pain is constant at 4/10 and will spike to 7/10 with movement, single leg activities, and walking. Left ilium is rotated posteriorly. Tightness in L2-L4. Weakness in her hips. Positive compression test on the left with pain. Patient is wearing a SI belt but needed instruction on how to place over the SI joint correctly. Patient is having difficulty with her work tasks due to her pain. She has had 2 c-sections and 1 vaginal birth. She is planning to deliver her child with c-section. Patient will benefit from skilled therapy to reduce her pain to a  manageable level and stabilitzaiton exercises.    Personal Factors and Comorbidities Comorbidity 1;Past/Current Experience    Comorbidities presently pregnant ;past has had 2 c-sections    Examination-Activity Limitations Bed Mobility;Locomotion Level;Transfers;Caring for Others;Carry;Sleep;Stairs;Stand;Lift    Examination-Participation Restrictions Meal Prep;Cleaning;Community Activity    Stability/Clinical Decision Making Evolving/Moderate complexity    Clinical Decision Making Low    Rehab Potential Good    PT Frequency 1x / week    PT Duration 12 weeks    PT Treatment/Interventions Cryotherapy;Moist Heat;Functional mobility training;Therapeutic activities;Therapeutic exercise;Neuromuscular re-education;Manual techniques;Patient/family education;Spinal Manipulations;Taping    PT Next Visit Plan correct pelvis, work on SI stability, go over body mechanics; standing with TA activation, lumbar mobility exercises    PT Home Exercise Plan Access Code: [email or web address]    Consulted and Agree with Plan of Care Patient           Patient will benefit from skilled therapeutic intervention in order to improve the following deficits and impairments:  Increased fascial restricitons, Decreased endurance, Increased muscle spasms, Pain, Decreased activity tolerance, Decreased strength  Visit Diagnosis: Acute bilateral low back pain without  sciatica - Plan: PT plan of care cert/re-cert  Pelvic pain - Plan: PT plan of care cert/re-cert     Problem List Patient Active Problem List   Diagnosis Date Noted  . Sjogren's syndrome (HCC) 08/13/2019  . Psoriasis 07/15/2019  . ANA positive 06/10/2019  . Supervision of other normal pregnancy, antepartum 06/05/2019  . History of postpartum depression, currently pregnant 10/28/2017  . History of cesarean delivery, antepartum 10/28/2017  . Recurrent pregnancy loss 08/21/2017    Eulis Foster, PT 09/23/19 1:31 PM   [facility] [address], [address] [place], [place], [address] Phone: [phone]   Fax:  [phone]  Name: Laura [name] MRN: [id number] Date of Birth: 02/02/1996

## 2019-09-29 ENCOUNTER — Ambulatory Visit (INDEPENDENT_AMBULATORY_CARE_PROVIDER_SITE_OTHER): Payer: Medicaid Other | Admitting: Clinical

## 2019-09-29 ENCOUNTER — Encounter (INDEPENDENT_AMBULATORY_CARE_PROVIDER_SITE_OTHER): Payer: Self-pay

## 2019-09-29 ENCOUNTER — Encounter: Payer: Managed Care, Other (non HMO) | Admitting: Physical Therapy

## 2019-09-29 DIAGNOSIS — O99340 Other mental disorders complicating pregnancy, unspecified trimester: Secondary | ICD-10-CM | POA: Diagnosis not present

## 2019-09-29 DIAGNOSIS — F419 Anxiety disorder, unspecified: Secondary | ICD-10-CM | POA: Diagnosis not present

## 2019-09-29 DIAGNOSIS — Z3A00 Weeks of gestation of pregnancy not specified: Secondary | ICD-10-CM

## 2019-10-06 ENCOUNTER — Encounter: Payer: Self-pay | Admitting: Physical Therapy

## 2019-10-06 ENCOUNTER — Encounter: Payer: Managed Care, Other (non HMO) | Attending: Obstetrics & Gynecology | Admitting: Physical Therapy

## 2019-10-06 ENCOUNTER — Other Ambulatory Visit: Payer: Self-pay

## 2019-10-06 DIAGNOSIS — M545 Low back pain, unspecified: Secondary | ICD-10-CM

## 2019-10-06 DIAGNOSIS — R102 Pelvic and perineal pain: Secondary | ICD-10-CM | POA: Diagnosis not present

## 2019-10-06 NOTE — Therapy (Signed)
[facility] at [facility] [address] [place], [place], [address] Phone: [phone]   Fax:  [phone]  Physical Therapy Treatment  Patient Details  Name: Laura [name] MRN: [id number] Date of Birth: 04-17-1995 Referring Provider (PT): Dr. Elsie Lincoln   Encounter Date: 10/06/2019   PT End of Session - 10/06/19 0926    Visit Number 2    Date for PT Re-Evaluation 12/16/19    Authorization Type [employer]/ Medicaid    PT Start Time 0830    PT Stop Time 0920    PT Time Calculation (min) 50 min    Activity Tolerance Patient tolerated treatment well;No increased pain    Behavior During Therapy WFL for tasks assessed/performed           Past Medical History:  Diagnosis Date  . Anxiety   . Depression    since 12/18 from [facility], PPD  . Hydrocephalus (HCC)   . Meningitis   . PTSD (post-traumatic stress disorder) 2019  . Sjogren's syndrome (HCC)   . Spinal headache     Past Surgical History:  Procedure Laterality Date  . APPENDECTOMY    . CESAREAN SECTION N/A 12/30/2015   Procedure: CESAREAN SECTION;  Surgeon: Adam Phenix, MD;  Location: [facility] BIRTHING SUITES;  Service: Obstetrics;  Laterality: N/A;  . CESAREAN SECTION N/A 04/05/2018   Procedure: CESAREAN SECTION;  Surgeon: Catalina Antigua, MD;  Location: [facility] BIRTHING SUITES;  Service: Obstetrics;  Laterality: N/A;  . CHOLECYSTECTOMY    . spnal tap    . URETHRA SURGERY      There were no vitals filed for this visit.   Subjective Assessment - 10/06/19 0837    Subjective I have been practing the information and has been helping.  The pelvic pain is worse with walking.    Patient Stated Goals reduce pain and improve function    Currently in Pain? Yes    Pain Score 4     Pain Location Back   pelvic   Pain Orientation Lower    Pain Descriptors / Indicators Aching;Sharp    Pain Type Acute pain    Pain Onset More than a month ago    Pain Frequency Constant    Aggravating  Factors  twisting, lifting leg, getting out of bed, going up and down steps    Pain Relieving Factors change position, lay with leg on a pillow    Multiple Pain Sites No                             [facility] Adult PT Treatment/Exercise - 10/06/19 0001      Self-Care   Self-Care Other Self-Care Comments    Other Self-Care Comments  using massage ball on subocciptial, and thoracic to reduce muscle spasms      Therapeutic Activites    Therapeutic Activities Other Therapeutic Activities    Other Therapeutic Activities elongate posture with chin tuck       Neuro Re-ed    Neuro Re-ed Details  breathing into the lower rib cage for expansion in sidely, standing and quadruped      Lumbar Exercises: Stretches   Quadruped Mid Back Stretch 1 rep;30 seconds    Quadruped Mid Back Stretch Limitations childs pose to elongate the mid back area and hips      Lumbar Exercises: Sidelying   Other Sidelying Lumbar Exercises sidely wiht breathing into the lower rib cage to mobilize and espand  the diaphgram      Lumbar Exercises: Quadruped   Other Quadruped Lumbar Exercises quadruped with TA contraction and opening up the posterior rib cage      Manual Therapy   Manual Therapy Soft tissue mobilization;Myofascial release    Manual therapy comments educated patient on how to use the suction cup to releese the lateral trunk bil. in sitting    Joint Mobilization rotational mobilzation to L2-L4    Soft tissue mobilization softt tissue work to the quadratus and between the lower ribs to imporve expansion    Myofascial Release using the suction cup in sidely to release the lumbar, thoracolumbar area, lateral trunk and abdomen                  PT Education - 10/06/19 0925    Education Details instruction on using suction cup to release the fascia of the lateral trunk, quadruped to perform TA contraction and open up the back with breath, sidely wiht breath into the lower rib cage, how to  use a tennis ball to release the suboccpitals and thoracic    Person(s) Educated Patient    Methods Explanation;Demonstration;Verbal cues;Handout    Comprehension Verbalized understanding;Returned demonstration            PT Short Term Goals - 09/23/19 1320      PT SHORT TERM GOAL #1   Title independent with initial HEP    Time 4    Period Weeks    Status New    Target Date 10/21/19             PT Long Term Goals - 09/23/19 1321      PT LONG TERM GOAL #1   Title independent with advanced HEP    Baseline not educated yet    Time 12    Period Weeks    Status New    Target Date 12/16/19      PT LONG TERM GOAL #2   Title understand ways to move in bed with a pillow between knees to reduce strain on the SI joint    Time 12    Period Weeks    Status New    Target Date 12/16/19      PT LONG TERM GOAL #3   Title understand how to manage pain with modifing her home tasks, reducing single leg stance activities    Baseline not educated yet    Time 12    Period Weeks    Status New    Target Date 12/16/19      PT LONG TERM GOAL #4   Title understand ways to lift her 58 year old child correctly with decreased strain on lumbar and SI joint    Baseline not educated yet    Time 12    Period Weeks    Status New    Target Date 12/16/19      PT LONG TERM GOAL #5   Title pain level stays around 4/10 75% of the time instead of being a 7/10    Baseline spikes up to 7/10 daily with different tasks    Time 12    Period Weeks    Status New    Target Date 12/16/19                 Plan - 10/06/19 0844    Clinical Impression Statement Patient reports the exercises have been helping. After the manual work she felt less pressure int he pelvis and the  baby able to go up instead of staying low. She has fascial tightness in the throacic lumbar area, lateral trunk and lower rib cage. Patient needs tactile cues to expand the lower rib cage and thoracic area with breath. Patient  is able to contract her TA. Patient will benefit from skilled therapy to reduce her pain to a manageable level and stabilization exercises.    Personal Factors and Comorbidities Comorbidity 1;Past/Current Experience    Comorbidities presently pregnant ;past has had 2 c-sections    Examination-Activity Limitations Bed Mobility;Locomotion Level;Transfers;Caring for Others;Carry;Sleep;Stairs;Stand;Lift    Examination-Participation Restrictions Meal Prep;Cleaning;Community Activity    Stability/Clinical Decision Making Evolving/Moderate complexity    Rehab Potential Good    PT Frequency 1x / week    PT Duration 12 weeks    PT Treatment/Interventions Cryotherapy;Moist Heat;Functional mobility training;Therapeutic activities;Therapeutic exercise;Neuromuscular re-education;Manual techniques;Patient/family education;Spinal Manipulations;Taping    PT Next Visit Plan sitting TA contraction Iliacus pull backs, hands and knees thoracic rotation, sidely mid back rotationGlute adductor squeeze    PT Home Exercise Plan Access Code: [email or web address]    Recommended Other Services MD signed initial eval    Consulted and Agree with Plan of Care Patient           Patient will benefit from skilled therapeutic intervention in order to improve the following deficits and impairments:  Increased fascial restricitons, Decreased endurance, Increased muscle spasms, Pain, Decreased activity tolerance, Decreased strength  Visit Diagnosis: Acute bilateral low back pain without sciatica  Pelvic pain     Problem List Patient Active Problem List   Diagnosis Date Noted  . Sjogren's syndrome (HCC) 08/13/2019  . Psoriasis 07/15/2019  . ANA positive 06/10/2019  . Supervision of other normal pregnancy, antepartum 06/05/2019  . History of postpartum depression, currently pregnant 10/28/2017  . History of cesarean delivery, antepartum 10/28/2017  . Recurrent pregnancy loss 08/21/2017    Eulis Foster, PT 10/06/19 9:32  AM   [facility] [address], [address] [place], [place], [address] Phone: [phone]   Fax:  [phone]  Name: Jordin Dambrosio MRN: [id number] Date of Birth: [birth year]

## 2019-10-13 ENCOUNTER — Ambulatory Visit: Payer: Managed Care, Other (non HMO) | Attending: Obstetrics and Gynecology

## 2019-10-13 ENCOUNTER — Other Ambulatory Visit: Payer: Self-pay

## 2019-10-13 ENCOUNTER — Ambulatory Visit: Payer: Managed Care, Other (non HMO) | Admitting: *Deleted

## 2019-10-13 VITALS — BP 121/80 | HR 100

## 2019-10-13 DIAGNOSIS — M3500 Sicca syndrome, unspecified: Secondary | ICD-10-CM | POA: Diagnosis not present

## 2019-10-13 DIAGNOSIS — Z3A28 28 weeks gestation of pregnancy: Secondary | ICD-10-CM

## 2019-10-13 DIAGNOSIS — O0990 Supervision of high risk pregnancy, unspecified, unspecified trimester: Secondary | ICD-10-CM | POA: Diagnosis not present

## 2019-10-13 DIAGNOSIS — Z3480 Encounter for supervision of other normal pregnancy, unspecified trimester: Secondary | ICD-10-CM | POA: Insufficient documentation

## 2019-10-13 DIAGNOSIS — O09293 Supervision of pregnancy with other poor reproductive or obstetric history, third trimester: Secondary | ICD-10-CM | POA: Diagnosis not present

## 2019-10-13 DIAGNOSIS — O2693 Pregnancy related conditions, unspecified, third trimester: Secondary | ICD-10-CM

## 2019-10-13 DIAGNOSIS — O34219 Maternal care for unspecified type scar from previous cesarean delivery: Secondary | ICD-10-CM | POA: Diagnosis not present

## 2019-10-15 NOTE — BH Specialist Note (Signed)
Integrated Behavioral Health via Telemedicine Video (Caregility) Visit  10/15/2019 Laura [name] [id number]  Number of Integrated Behavioral Health visits: 3 (4 total) Session Start time: 8:25  Session End time: 8:56 Total time: 31  Referring Provider: Catalina Antigua, MD Type of Visit: Video Patient/Family location: [facility] Provider location: [facility] for [facility]  All persons participating in visit: Patient Laura [name] and [facility] Laura [name]    Confirmed patient's address: Yes  Confirmed patient's phone number: Yes  Any changes to demographics: No   Confirmed patient's insurance: Yes  Any changes to patient's insurance: No   Discussed confidentiality: at previous visit  I connected with Laura [name]  by a video enabled telemedicine application (Caregility) and verified that I am speaking with the correct person using two identifiers.     I discussed the limitations of evaluation and management by telemedicine and the availability of in person appointments.  I discussed that the purpose of this visit is to provide behavioral health care while limiting exposure to the novel coronavirus.   Discussed there is a possibility of technology failure and discussed alternative modes of communication if that failure occurs.  I discussed that engaging in this virtual visit, they consent to the provision of behavioral healthcare and the services will be billed under their insurance.  Patient and/or legal guardian expressed understanding and consented to virtual visit: Yes   PRESENTING CONCERNS: Patient and/or family reports the following symptoms/concerns: Pt states her primary goal remains to maintain reduction of anxiety through remainder of pregnancy and postpartum; needed to take hydroxyzine once in past month with panic; taking BH medication as prescribed, mild sleep difficulty attributed to feeling uncomfortable at night,  feels well-prepared for birth and postpartum; looking forward to starting back taking virtual classes next week.  Duration of problem: Ongoing with increase in pregnancy; Severity of problem: mild  STRENGTHS (Protective Factors/Coping Skills): Good social support, utilizes self-coping strategies as needed (grounding, relaxation breathing, journal); adhering to treatment via BH meds  GOALS ADDRESSED: Patient will: 1.  Reduce symptoms of: anxiety  2.  Demonstrate ability to: Increase healthy adjustment to current life circumstances  INTERVENTIONS: Interventions utilized:  Supportive Counseling and Psychoeducation and/or Health Education Standardized Assessments completed: GAD-7 and PHQ 9  ASSESSMENT: Patient currently experiencing Anxiety disorder, unspecified   Patient may benefit from continued psychoeducation and brief therapeutic interventions regarding coping with symptoms of anxiety .  PLAN: 1. Follow up with behavioral health clinician on : Postpartum; Call Trajon Rosete at [phone] if visit needed prior to postpartum scheduled visi 2. Behavioral recommendations:  -Continue taking prenatal vitamin and BH medications as prescribed -Continue using daily self-coping strategies that have helped to manage symptoms of anxiety -Read through Postpartum Planner (on After Visit Summary) and use as needed -Continue to focus on healthy sleep with brief naps as needed throughout the day 3. Referral(s): Integrated [employer] (In Clinic)  I discussed the assessment and treatment plan with the patient and/or parent/guardian. They were provided an opportunity to ask questions and all were answered. They agreed with the plan and demonstrated an understanding of the instructions.   They were advised to call back or seek an in-person evaluation if the symptoms worsen or if the condition fails to improve as anticipated.  [name] [facility]  Depression screen [facility] 2/9 10/27/2019 09/29/2019  09/04/2019 10/31/2017 06/30/2015  Decreased Interest 0 1 0 2 0  Down, Depressed, Hopeless 0 0 0 2 0  PHQ - 2  Score 0 1 0 4 0  Altered sleeping 3 3 2 1  -  Tired, decreased energy 3 3 3 1  -  Change in appetite 0 0 0 1 -  Feeling bad or failure about yourself  0 0 1 0 -  Trouble concentrating 0 0 3 2 -  Moving slowly or fidgety/restless 0 0 0 1 -  Suicidal thoughts 0 0 0 0 -  PHQ-9 Score 6 7 9 10  -   GAD 7 : Generalized Anxiety Score 09/29/2019 09/04/2019 10/31/2017  Nervous, Anxious, on Edge 0 3 3  Control/stop worrying 1 2 2   Worry too much - different things 0 2 3  Trouble relaxing 0 1 3  Restless 0 0 2  Easily annoyed or irritable 0 2 2  Afraid - awful might happen 0 2 3  Total GAD 7 Score 1 12 18

## 2019-10-19 ENCOUNTER — Ambulatory Visit (INDEPENDENT_AMBULATORY_CARE_PROVIDER_SITE_OTHER): Payer: Managed Care, Other (non HMO) | Admitting: Obstetrics & Gynecology

## 2019-10-19 ENCOUNTER — Other Ambulatory Visit: Payer: Self-pay

## 2019-10-19 VITALS — BP 115/75 | HR 93 | Wt 218.0 lb

## 2019-10-19 DIAGNOSIS — Z8659 Personal history of other mental and behavioral disorders: Secondary | ICD-10-CM

## 2019-10-19 DIAGNOSIS — O99891 Other specified diseases and conditions complicating pregnancy: Secondary | ICD-10-CM

## 2019-10-19 DIAGNOSIS — O34219 Maternal care for unspecified type scar from previous cesarean delivery: Secondary | ICD-10-CM

## 2019-10-19 DIAGNOSIS — Z3480 Encounter for supervision of other normal pregnancy, unspecified trimester: Secondary | ICD-10-CM

## 2019-10-19 DIAGNOSIS — Z23 Encounter for immunization: Secondary | ICD-10-CM

## 2019-10-19 NOTE — Progress Notes (Signed)
   PRENATAL VISIT NOTE  Subjective:  Laura [name] is a 24 y.o. [phone] at [redacted]w[redacted]d being seen today for ongoing prenatal care.  She is currently monitored for the following issues for this high-risk pregnancy and has Recurrent pregnancy loss; History of postpartum depression, currently pregnant; History of cesarean delivery, antepartum; Supervision of other normal pregnancy, antepartum; ANA positive; Psoriasis; and Sjogren's syndrome (HCC) on their problem list.  Patient reports back pain.  Contractions: Not present. Vag. Bleeding: None.  Movement: Present. Denies leaking of fluid.   The following portions of the patient's history were reviewed and updated as appropriate: allergies, current medications, past family history, past medical history, past social history, past surgical history and problem list.   Objective:   Vitals:   10/19/19 0949  BP: 115/75  Pulse: 93  Weight: (!) 218 lb (98.9 kg)    Fetal Status: Fetal Heart Rate (bpm): 135   Movement: Present     General:  Alert, oriented and cooperative. Patient is in no acute distress.  Skin: Skin is warm and dry. No rash noted.   Cardiovascular: Normal heart rate noted  Respiratory: Normal respiratory effort, no problems with respiration noted  Abdomen: Soft, gravid, appropriate for gestational age.  Pain/Pressure: Present     Pelvic: Cervical exam deferred        Extremities: Normal range of motion.  Edema: Trace  Mental Status: Normal mood and affect. Normal behavior. Normal judgment and thought content.   Assessment and Plan:  Pregnancy: [id number] at [redacted]w[redacted]d 1. Supervision of other normal pregnancy, antepartum - 2Hr GTT w/ 1 Hr Carpenter 75 g - HIV antibody (with reflex) - CBC - RPR - Tdap vaccine greater than or equal to 7yo IM  2. History of postpartum depression, currently pregnant -sees Reather Littler for integrated behavior health (anxiety).  Zoloft working great for depression.  A little more anxious.  Working with  therapist.      3.  Pelvic Pressure Wearing belt and seeing cheryl PT.    Preterm labor symptoms and general obstetric precautions including but not limited to vaginal bleeding, contractions, leaking of fluid and fetal movement were reviewed in detail with the patient. Please refer to After Visit Summary for other counseling recommendations.   No follow-ups on file.  Future Appointments  Date Time Provider Department Center  10/27/2019  8:15 AM [facility] CLINICIAN [facility]  11/09/2019 11:45 AM Theressa Millard, PT [facility]  11/16/2019 11:45 AM Theressa Millard, PT [facility]  11/25/2019 11:00 AM Theressa Millard, PT [facility]    Elsie Lincoln, MD

## 2019-10-20 LAB — CBC
HCT: 34.4 % — ABNORMAL LOW (ref 35.0–45.0)
Hemoglobin: 11.4 g/dL — ABNORMAL LOW (ref 11.7–15.5)
MCH: 30.7 pg (ref 27.0–33.0)
MCHC: 33.1 g/dL (ref 32.0–36.0)
MCV: 92.7 fL (ref 80.0–100.0)
MPV: 11.1 fL (ref 7.5–12.5)
Platelets: 237 10*3/uL (ref 140–400)
RBC: 3.71 10*6/uL — ABNORMAL LOW (ref 3.80–5.10)
RDW: 12.3 % (ref 11.0–15.0)
WBC: 6.8 10*3/uL (ref 3.8–10.8)

## 2019-10-20 LAB — RPR: RPR Ser Ql: NONREACTIVE

## 2019-10-20 LAB — 2HR GTT W 1 HR, CARPENTER, 75 G
Glucose, 1 Hr, Gest: 118 mg/dL (ref 65–179)
Glucose, 2 Hr, Gest: 96 mg/dL (ref 65–152)
Glucose, Fasting, Gest: 74 mg/dL (ref 65–91)

## 2019-10-20 LAB — HIV ANTIBODY (ROUTINE TESTING W REFLEX): HIV 1&2 Ab, 4th Generation: NONREACTIVE

## 2019-10-27 ENCOUNTER — Ambulatory Visit (INDEPENDENT_AMBULATORY_CARE_PROVIDER_SITE_OTHER): Payer: 59 | Admitting: Clinical

## 2019-10-27 ENCOUNTER — Telehealth: Payer: Self-pay | Admitting: Family Medicine

## 2019-10-27 ENCOUNTER — Other Ambulatory Visit: Payer: Self-pay

## 2019-10-27 DIAGNOSIS — F419 Anxiety disorder, unspecified: Secondary | ICD-10-CM

## 2019-10-27 NOTE — Telephone Encounter (Signed)
Called by [facility] for patient's elevated BP. 118/92. Patient also having nausea, mild headache, swelling. Called and talked to the patient. Recommended tylenol for headache. Pepcid for GERD (cause of patient's nausea). She had taken BP earlier today and it was 120s/91.   Will have office reach out to her for BP check.

## 2019-10-27 NOTE — Patient Instructions (Signed)
[facility] [address] [place], [place] [address] [phone] (main office) [phone] (Chibuike Fleek's office)     BRAINSTORMING  Develop a Plan Goals: . Provide a way to start conversation about your new life with a baby . Assist parents in recognizing and using resources within their reach . Help pave the way before birth for an easier period of transition afterwards.  Make a list of the following information to keep in a central location: . Full name of Mom and Partner: _____________________________________________ . Baby's full name and Date of Birth: ___________________________________________ . Home Address: ___________________________________________________________ ________________________________________________________________________ . Home Phone: ____________________________________________________________ . Parents' cell numbers: _____________________________________________________ ________________________________________________________________________ . Name and contact info for OB: ______________________________________________ . Name and contact info for Pediatrician:________________________________________ . Contact info for Lactation Consultants: ________________________________________  REST and SLEEP *You each need at least 4-5 hours of uninterrupted sleep every day. Write specific names and contact information.* . How are you going to rest in the postpartum period? While partner's home? When partner returns to work? When you both return to work? . Where will your baby sleep? . Who is available to help during the day? Evening? Night? . Who could move in for a period to help support you? . What are some ideas to help you get enough  sleep? __________________________________________________________________________________________________________________________________________________________________________________________________________________________________________ NUTRITIOUS FOOD AND DRINK *Plan for meals before your baby is born so you can have healthy food to eat during the immediate postpartum period.* . Who will look after breakfast? Lunch? Dinner? List names and contact information. Brainstorm quick, healthy ideas for each meal. . What can you do before baby is born to prepare meals for the postpartum period? . How can others help you with meals? . Which grocery stores provide online shopping and delivery? . Which restaurants offer take-out or delivery options? ______________________________________________________________________________________________________________________________________________________________________________________________________________________________________________________________________________________________________________________________________________________________________________________________________  CARE FOR MOM *It's important that mom is cared for and pampered in the postpartum period. Remember, the most important ways new mothers need care are: sleep, nutrition, gentle exercise, and time off.* . Who can come take care of mom during this period? Make a list of people with their contact information. . List some activities that make you feel cared for, rested, and energized? Who can make sure you have opportunities to do these things? . Does mom have a space of her very own within your home that's just for her? Make a "Mama Cave" where she can be comfortable, rest, and renew herself  daily. ______________________________________________________________________________________________________________________________________________________________________________________________________________________________________________________________________________________________________________________________________________________________________________________________________    CARE FOR AND FEEDING BABY *Knowledgeable and encouraging people will offer the best support with regard to feeding your baby.* . Educate yourself and choose the best feeding option for your baby. . Make a list of people who will guide, support, and be a resource for you as your care for and feed your baby. (Friends that have breastfed or are currently breastfeeding, lactation consultants, breastfeeding support groups, etc.) . Consider a postpartum doula. (These websites can give you information: dona.org & padanc.org) . Seek out local breastfeeding resources like the breastfeeding support group at [facility] or [employer]. ______________________________________________________________________________________________________________________________________________________________________________________________________________________________________________________________________________________________________________________________________________________________________________________________________  CHORES AND ERRANDS . Who can help with a thorough cleaning before baby is born? . Make a list of people who will help with housekeeping and chores, like laundry, light cleaning, dishes, bathrooms, etc. . Who can run some errands for you? . What can you do to make sure you are stocked with basic supplies before baby is born? . Who is going to do the    shopping? ______________________________________________________________________________________________________________________________________________________________________________________________________________________________________________________________________________________________________________________________________________________________________________________________________     Family Adjustment *Nurture yourselves.it helps parents be more loving and allows for better bonding with their child.* . What sorts of things do you and partner enjoy doing together? Which activities help you to connect and strengthen your relationship? Make a list of those things. Make a list of people whom you trust to care for your baby so you can have some time together as a couple. . What types of things help partner feel connected to Mom? Make a list. . What needs will partner have in order to bond with baby? . Other children? Who will care for them when you go into labor and while you are in the hospital? . Think about what the needs of your older children might be. Who can help you meet those needs? In what ways are you helping them prepare for bringing baby home? List some specific strategies you have for family adjustment. _______________________________________________________________________________________________________________________________________________________________________________________________________________________________________________________________________________________________________________________________________________  SUPPORT *Someone who can empathize with experiences normalizes your problems and makes them more bearable.* . Make a list of other friends, neighbors, and/or co-workers you know with infants (and small children, if applicable) with whom you can connect. . Make a list of local or online support groups, mom groups, etc. in which you can be  involved. ______________________________________________________________________________________________________________________________________________________________________________________________________________________________________________________________________________________________________________________________________________________________________________________________________  Childcare Plans . Investigate and plan for childcare if mom is returning to work. . Talk about mom's concerns about her transition back to work. . Talk about partner's concerns regarding this transition.  Mental Health *Your mental health is one of the highest priorities for a pregnant or postpartum mom.* . 1 in 5 women experience anxiety and/or depression from the time of conception through the first year after birth. . Postpartum Mood Disorders are the #1 complication of pregnancy and childbirth and the suffering experienced by these mothers is not necessary! These illnesses are temporary and respond well to treatment, which often includes self-care, social support, talk therapy, and medication when needed. . Women experiencing anxiety and depression often say things like: "I'm supposed to be happy.why do I feel so sad?", "Why can't I snap out of it?", "I'm having thoughts that scare me." . There is no need to be embarrassed if you are feeling these symptoms: o Overwhelmed, anxious, angry, sad, guilty, irritable, hopeless, exhausted but can't sleep o You are NOT alone. You are NOT to blame. With help, you WILL be well. . Where can I find help? Medical professionals such as your OB, midwife, gynecologist, family practitioner, primary care provider, pediatrician, or mental health providers; [facility] support groups: Feelings After Birth, Breastfeeding Support Group, Baby and Me Group, and Fit 4 Two exercise classes. . You have permission to ask for help. It will confirm your feelings, validate your  experiences, share/learn coping strategies, and gain support and encouragement as you heal. You are important! BRAINSTORM . Make a list of local resources, including resources for mom and for partner. . Identify support groups. . Identify people to call late at night - include names and contact info. . Talk with partner about perinatal mood and anxiety disorders. . Talk with your OB, midwife, and doula about baby blues and about perinatal mood and anxiety disorders. . Talk with your pediatrician about perinatal mood and anxiety disorders.   Support & Sanity Savers   What do you really need?  . Basics . In preparing for a new baby, many expectant parents spend hours shopping for baby clothes, decorating the nursery, and deciding which car seat to   buy. Yet most don't think much about what the reality of parenting a newborn will be like, and what they need to make it through that. So, here is the advice of experienced parents. We know you'll read this, and think "they're exaggerating, I don't really need that." Just trust us on these, OK? Plan for all of this, and if it turns out you don't need it, come back and teach us how you did it!  . Must-Haves (Once baby's survival needs are met, make sure you attend to your own survival needs!) . Sleep . An average newborn sleeps 16-18 hours per day, over 6-7 sleep periods, rarely more than three hours at a time. It is normal and healthy for a newborn to wake throughout the night... but really hard on parents!! . Naps. Prioritize sleep above any responsibilities like: cleaning house, visiting friends, running errands, etc.  Sleep whenever baby sleeps. If you can't nap, at least have restful times when baby eats. The more rest you get, the more patient you will be, the more emotionally stable, and better at solving problems.  . Food . You may not have realized it would be difficult to eat when you have a newborn. Yet, when we talk to . countless new  parents, they say things like "it may be 2:00 pm when I realize I haven't had breakfast yet." Or "every time we sit down to dinner, baby needs to eat, and my food gets cold, so I don't bother to eat it." . Finger food. Before your baby is born, stock up with one months' worth of food that: 1) you can eat with one hand while holding a baby, 2) doesn't need to be prepped, 3) is good hot or cold, 4) doesn't spoil when left out for a few hours, and 5) you like to eat. Think about: nuts, dried fruit, Clif bars, pretzels, jerky, gogurt, baby carrots, apples, bananas, crackers, cheez-n-crackers, string cheese, hot pockets or frozen burritos to microwave, garden burgers and breakfast pastries to put in the toaster, yogurt drinks, etc. . Restaurant Menus. Make lists of your favorite restaurants & menu items. When family/friends want to help, you can give specific information without much thought. They can either bring you the food or send gift cards for just the right meals. . Freezer Meals.  Take some time to make a few meals to put in the freezer ahead of time.  Easy to freeze meals can be anything such as soup, lasagna, chicken pie, or spaghetti sauce. . Set up a Meal Schedule.  Ask friends and family to sign up to bring you meals during the first few weeks of being home. (It can be passed around at baby showers!) You have no idea how helpful this will be until you are in the throes of parenting.  www.takethemameal.com is a great website to check out. . Emotional Support . Know who to call when you're stressed out. Parenting a newborn is very challenging work. There are times when it totally overwhelms your normal coping abilities. EVERY NEW PARENT NEEDS TO HAVE A PLAN FOR WHO TO CALL WHEN THEY JUST CAN'T COPE ANY MORE. (And it has to be someone other than the baby's other parent!) Before your baby is born, come up with at least one person you can call for support - write their phone number down and post it on the  refrigerator. . Anxiety & Sadness. Baby blues are normal after pregnancy; however, there are more severe types of anxiety &   sadness which can occur and should not be ignored.  They are always treatable, but you have to take the first step by reaching out for help. [facility] offers a "Mom Talk" group which meets every Tuesday from 10 am - 11 am.  This group is for new moms who need support and connection after their babies are born.  Call [phone].  . Really, Really Helpful (Plan for them! Make sure these happen often!!) . Physical Support with Taking Care of Yourselves . Asking friends and family. Before your baby is born, set up a schedule of people who can come and visit and help out (or ask a friend to schedule for you). Any time someone says "let me know what I can do to help," sign them up for a day. When they get there, their job is not to take care of the baby (that's your job and your joy). Their job is to take care of you!  . Postpartum doulas. If you don't have anyone you can call on for support, look into postpartum doulas:  professionals at helping parents with caring for baby, caring for themselves, getting breastfeeding started, and helping with household tasks. www.padanc.org is a helpful website for learning about doulas in our area. . Peer Support / Parent Groups . Why: One of the greatest ideas for new parents is to be around other new parents. Parent groups give you a chance to share and listen to others who are going through the same season of life, get a sense of what is normal infant development by watching several babies learn and grow, share your stories of triumph and struggles with empathetic ears, and forgive your own mistakes when you realize all parents are learning by trial and error. . Where to find: There are many places you can meet other new parents throughout our community.  [facility] offers the following classes for new moms and their little ones:  Baby  and Me (Birth to Crawling) and Breastfeeding Support Group. Go to www.conehealthybaby.com or call [phone] for more information. . Time for your Relationship . It's easy to get so caught up in meeting baby's immediate needs that it's hard to find time to connect with your partner, and meet the needs of your relationship. It's also easy to forget what "quality time with your partner" actually looks like. If you take your baby on a date, you'd be amazed how much of your couple time is spent feeding the baby, diapering the baby, admiring the baby, and talking about the baby. . Dating: Try to take time for just the two of you. Babysitter tip: Sometimes when moms are breastfeeding a newborn, they find it hard to figure out how to schedule outings around baby's unpredictable feeding schedules. Have the babysitter come for a three hour period. When she comes over, if baby has just eaten, you can leave right away, and come back in two hours. If baby hasn't fed recently, you start the date at home. Once baby gets hungry and gets a good feeding in, you can head out for the rest of your date time. . Date Nights at Home: If you can't get out, at least set aside one evening a week to prioritize your relationship: whenever baby dozes off or doesn't have any immediate needs, spend a little time focusing on each other. . Potential conflicts: The main relationship conflicts that come up for new parents are: issues related to sexuality, financial stresses, a feeling of an unfair division   of household tasks, and conflicts in parenting styles. The more you can work on these issues before baby arrives, the better!  . Fun and Frills (Don't forget these. and don't feel guilty for indulging in them!) . Everyone has something in life that is a fun little treat that they do just for themselves. It may be: reading the morning paper, or going for a daily jog, or having coffee with a friend once a week, or going to a movie on Friday  nights, or fine chocolates, or bubble baths, or curling up with a good book. . Unless you do fun things for yourself every now and then, it's hard to have the energy for fun with your baby. Whatever your "special" treats are, make sure you find a way to continue to indulge in them after your baby is born. These special moments can recharge you, and allow you to return to baby with a new joy   PERINATAL MOOD DISORDERS: MATERNAL MENTAL HEALTH FROM CONCEPTION THROUGH THE POSTPARTUM PERIOD   Emergency and Crisis Resources:  If you are an imminent risk to self or others, are experiencing intense personal distress, and/or have noticed significant changes in activities of daily living, call:  . 911 . [facility]: [phone] . Mobile Crisis: 877-626-1772 . National Suicide Hotline: 1-800-273-8255 Or visit the following crisis centers: . Local Emergency Departments . [facility]: [address], [place] [phone]. Hours: 8:30AM-5PM. Insurance Accepted: Medicaid, Medicare, and Uninsured.  . [facility]  [address], [place] Mon-Friday 8am-3pm  [phone]                                                                                    Non-Crisis Resources: To identify specific providers that are covered by your insurance, contact your insurance company or local agencies: [facility]: 1-800-256-2452 [facility]: 888-581-9988 [facility]: 1-800-939-5911 Postpartum Support International- [place] 1-800-944-4773                                                      Outpatient therapy and medication management providers:  [facility] [phone] Hours: 9AM-5PM  Insurance Accepted: AARP, Aetna, BCBS, Cigna, [employer], Humana, Medicare  [facility] [phone] Hours: 8AM-5PM  nsurance Accepted: All insurances EXCEPT AARP, Aetna, [employer], and  [employer] [facility]: [phone]             Hours: 8AM-8PM Insurance Accepted: Aetna, BCBS, Cigna, [employer], Medicaid, Medicare, Uninsured [facility]: [phone] [facility]: [phone] Hours: 8:30AM-7PM Insurance Accepted: Aetna, BCBS, Medicaid, Medicare, Tricare, [employer] [facility]:  [phone]              Hours:9AM-5PM Insurance Accepted:  Aetna, BCBS, [facility], Medicaid, United Health Care  [facility] [phone] Hours: 9AM-5:30PM Insurance Accepted: AARP, Aetna, BCBS, Cigna, and Medicaid, Medicare, United Health Care [facility]:  [phone] Hours: 9am-5pm Insurance Accepted: BCBS; they do not accept Medicaid/Medicare   [facility]: [phone] Hours: 9am-9pm Insurance Accepted: All major insurance including Medicaid and Medicare [facility]: [phone] Hours: 9AM-5:30PM Insurance Accepted: All insurances EXCEPT Medicaid and Medicare. [facility]: [phone]                                                                       Parenting Support Groups [facility]: [phone] [facility]:  [phone] Family Support Network (support for children in the NICU and/or with special needs), [phone]                                                                   Mental Health Support Groups Mental [employer] Association: [phone]                                                                                     Online Resources: Postpartum Support International: http://www.postpartum.net/  800-944-4PPD 2Moms Supporting Moms:  www.momssupportingmoms.net     

## 2019-11-09 ENCOUNTER — Ambulatory Visit: Payer: Managed Care, Other (non HMO) | Attending: Obstetrics & Gynecology | Admitting: Physical Therapy

## 2019-11-09 ENCOUNTER — Telehealth: Payer: Self-pay | Admitting: Physical Therapy

## 2019-11-09 NOTE — Telephone Encounter (Signed)
Called patient about her appointment at 11:45 she no-showed today. Left a message.  Eulis Foster, PT @8 /23/2021@ 12:10 PM

## 2019-11-13 ENCOUNTER — Encounter: Payer: Self-pay | Admitting: Certified Nurse Midwife

## 2019-11-13 ENCOUNTER — Ambulatory Visit (INDEPENDENT_AMBULATORY_CARE_PROVIDER_SITE_OTHER): Payer: Managed Care, Other (non HMO) | Admitting: Certified Nurse Midwife

## 2019-11-13 ENCOUNTER — Other Ambulatory Visit: Payer: Self-pay

## 2019-11-13 VITALS — BP 111/72 | HR 75 | Wt 223.0 lb

## 2019-11-13 DIAGNOSIS — Z3A33 33 weeks gestation of pregnancy: Secondary | ICD-10-CM

## 2019-11-13 DIAGNOSIS — O26893 Other specified pregnancy related conditions, third trimester: Secondary | ICD-10-CM

## 2019-11-13 DIAGNOSIS — N898 Other specified noninflammatory disorders of vagina: Secondary | ICD-10-CM

## 2019-11-13 DIAGNOSIS — O34219 Maternal care for unspecified type scar from previous cesarean delivery: Secondary | ICD-10-CM

## 2019-11-13 DIAGNOSIS — Z3480 Encounter for supervision of other normal pregnancy, unspecified trimester: Secondary | ICD-10-CM

## 2019-11-13 NOTE — Progress Notes (Signed)
Subjective:  Laura [name] is a 24 y.o. [phone] at [redacted]w[redacted]d being seen today for prenatal visit and LOF. Reports her underwear feeling wet intermittently for the last week. No gush of fluid. Fluid is colorless and has sweet smell. No VB or abd pain. No recent IC She is currently monitored for the following issues for this high-risk pregnancy and has Recurrent pregnancy loss; History of postpartum depression, currently pregnant; History of cesarean delivery, antepartum; Supervision of other normal pregnancy, antepartum; ANA positive; Psoriasis; and Sjogren's syndrome (HCC) on their problem list.  Patient reports no bleeding and no cramping. No vaginal itching or malodor. Contractions: Not present. Vag. Bleeding: None.  Movement: Present. Denies leaking of fluid.   The following portions of the patient's history were reviewed and updated as appropriate: allergies, current medications, past family history, past medical history, past social history, past surgical history and problem list. Problem list updated.  Objective:   Vitals:   11/13/19 1102  BP: 111/72  Pulse: 75  Weight: 223 lb (101.2 kg)    Fetal Status: Fetal Heart Rate (bpm): 145 Fundal Height: 34 cm Movement: Present     General:  Alert, oriented and cooperative. Patient is in no acute distress.  Skin: Skin is warm and dry. No rash noted.   Cardiovascular: Normal heart rate noted  Respiratory: Normal respiratory effort, no problems with respiration noted  Abdomen: Soft, gravid, appropriate for gestational age. Pain/Pressure: Present     Pelvic:     SSE: no pool, fern neg  Extremities: Normal range of motion.  Edema: Trace  Mental Status: Normal mood and affect. Normal behavior. Normal judgment and thought content.   Urinalysis:      Assessment and Plan:  Pregnancy: [id number] at [redacted]w[redacted]d  1. [redacted] weeks gestation of pregnancy  2. Vaginal discharge during pregnancy in third trimester - no signs of PROM - wear cotton underwear,  change as needed - PTL precautions  3. Supervision of high risk pregnancy   Preterm labor symptoms and general obstetric precautions including but not limited to vaginal bleeding, contractions, leaking of fluid and fetal movement were reviewed in detail with the patient. Please refer to After Visit Summary for other counseling recommendations.  Return in about 2 weeks (around 11/27/2019).   Donette Larry, CNM

## 2019-11-16 ENCOUNTER — Encounter: Payer: Managed Care, Other (non HMO) | Admitting: Obstetrics and Gynecology

## 2019-11-16 ENCOUNTER — Telehealth: Payer: Self-pay | Admitting: Physical Therapy

## 2019-11-16 ENCOUNTER — Ambulatory Visit: Payer: Managed Care, Other (non HMO) | Admitting: Physical Therapy

## 2019-11-16 NOTE — Telephone Encounter (Signed)
Called patient about her appointment at 11:45 today she no showed for. Patient reports her son school was closed due to COVID so she forgot about the appointment. Patient no showed for her last visit and is aware if she no shows for her next appointment she will be discharged.  Laura [name], PT @8 /30/2021@ 12:04 PM

## 2019-11-25 ENCOUNTER — Ambulatory Visit: Payer: Medicaid Other | Attending: Obstetrics & Gynecology | Admitting: Physical Therapy

## 2019-11-25 ENCOUNTER — Other Ambulatory Visit: Payer: Self-pay

## 2019-11-25 ENCOUNTER — Encounter: Payer: Self-pay | Admitting: Physical Therapy

## 2019-11-25 DIAGNOSIS — R102 Pelvic and perineal pain: Secondary | ICD-10-CM | POA: Diagnosis not present

## 2019-11-25 DIAGNOSIS — M545 Low back pain, unspecified: Secondary | ICD-10-CM

## 2019-11-25 NOTE — Therapy (Signed)
[facility] [address], [address] [place], [place], [address] Phone: [phone]   Fax:  [phone]  Physical Therapy Treatment  Patient Details  Name: Laura [name] MRN: [id number] Date of Birth: 1996-01-24 Referring Provider (PT): Dr. Elsie Lincoln   Encounter Date: 11/25/2019   PT End of Session - 11/25/19 1109    Visit Number 3    Date for PT Re-Evaluation 12/16/19    Authorization Type [employer]/ Medicaid    Authorization - Visit Number 2    Authorization - Number of Visits 27    PT Start Time 1100    PT Stop Time 1138    PT Time Calculation (min) 38 min    Activity Tolerance Patient tolerated treatment well;No increased pain    Behavior During Therapy WFL for tasks assessed/performed           Past Medical History:  Diagnosis Date  . Anxiety   . Depression    since 12/18 from [facility], PPD  . Hydrocephalus (HCC)   . Meningitis   . PTSD (post-traumatic stress disorder) 2019  . Sjogren's syndrome (HCC)   . Spinal headache     Past Surgical History:  Procedure Laterality Date  . APPENDECTOMY    . CESAREAN SECTION N/A 12/30/2015   Procedure: CESAREAN SECTION;  Surgeon: Adam Phenix, MD;  Location: [facility];  Service: Obstetrics;  Laterality: N/A;  . CESAREAN SECTION N/A 04/05/2018   Procedure: CESAREAN SECTION;  Surgeon: Catalina Antigua, MD;  Location: [facility];  Service: Obstetrics;  Laterality: N/A;  . CHOLECYSTECTOMY    . spnal tap    . URETHRA SURGERY      There were no vitals filed for this visit.   Subjective Assessment - 11/25/19 1103    Subjective I was doing well until last night. I turned in bed and felt the pelvis bones pop. I am [redacted] weeks along. I am siting on the birthing ball, husband massaging back, and exercises.    Patient Stated Goals reduce pain and improve function    Currently in Pain? Yes    Pain Score 7     Pain Location Pelvis    Pain Orientation Lower     Pain Descriptors / Indicators Aching;Sharp    Pain Type Acute pain    Pain Onset More than a month ago    Pain Frequency Constant    Aggravating Factors  twisting, lifting leg, getting out of bed, going up and down steps    Pain Relieving Factors Change position, lay with leg on a pillow    Multiple Pain Sites No              [facility] PT Assessment - 11/25/19 0001      Palpation   SI assessment  left ilium rotated posteriorly      Special Tests    Special Tests Sacrolliac Tests    Sacroiliac Tests  Pelvic Compression      Pelvic Compression   Findings Positive    Side Left    comment pain                         [facility] Adult PT Treatment/Exercise - 11/25/19 0001      Therapeutic Activites    Therapeutic Activities Other Therapeutic Activities    Other Therapeutic Activities went over different positoins for labor, how her fiance can compress her pelvis to reduce labor pain, ways to bring the  baby through the pelvis to progress labor      Manual Therapy   Manual Therapy Muscle Energy Technique    Muscle Energy Technique to correct left ilium in sidely and supine                  PT Education - 11/25/19 1144    Education Details Access Code: [email or web address]; instruction on different birthing postions, ideas on how to be comfortable during labor    Person(s) Educated Patient    Methods Explanation;Demonstration;Handout    Comprehension Returned demonstration;Verbalized understanding            PT Short Term Goals - 11/25/19 1148      PT SHORT TERM GOAL #1   Title independent with initial HEP    Time 4    Period Weeks    Status Achieved             PT Long Term Goals - 11/25/19 1148      PT LONG TERM GOAL #1   Title independent with advanced HEP    Time 12    Period Weeks    Status On-going      PT LONG TERM GOAL #2   Title understand ways to move in bed with a pillow between knees to reduce strain on the SI joint    Time 12    Period  Weeks    Status Achieved      PT LONG TERM GOAL #3   Title understand how to manage pain with modifing her home tasks, reducing single leg stance activities    Period Weeks    Status On-going      PT LONG TERM GOAL #4   Title understand ways to lift her 26 year old child correctly with decreased strain on lumbar and SI joint    Time 12    Period Weeks    Status On-going      PT LONG TERM GOAL #5   Title pain level stays around 4/10 75% of the time instead of being a 7/10    Time 12    Period Weeks    Status On-going                 Plan - 11/25/19 1110    Clinical Impression Statement Patient was educated on ways to go through labor, how to go through the stage of labor and how her fiance can assist in reducing labor pain. Patient had pubic bone pain and reduced it by doing muscle energy. Patient back pain is doing better now. Patient will benefit from skilled therapy to reduce her pain to a managable level and stabilization exercises.    Personal Factors and Comorbidities Comorbidity 1;Past/Current Experience    Comorbidities presently pregnant ;past has had 2 c-sections    Examination-Activity Limitations Bed Mobility;Locomotion Level;Transfers;Caring for Others;Carry;Sleep;Stairs;Stand;Lift    Examination-Participation Restrictions Meal Prep;Cleaning;Community Activity    Stability/Clinical Decision Making Evolving/Moderate complexity    Rehab Potential Good    PT Frequency 1x / week    PT Duration 12 weeks    PT Treatment/Interventions Cryotherapy;Moist Heat;Functional mobility training;Therapeutic activities;Therapeutic exercise;Neuromuscular re-education;Manual techniques;Patient/family education;Spinal Manipulations;Taping    PT Next Visit Plan discharge next visit, address pain, and go over c-section scar care    PT Home Exercise Plan Access Code: [email or web address]    Consulted and Agree with Plan of Care Patient           Patient will benefit from skilled therapeutic  intervention in  order to improve the following deficits and impairments:  Increased fascial restricitons, Decreased endurance, Increased muscle spasms, Pain, Decreased activity tolerance, Decreased strength  Visit Diagnosis: Acute bilateral low back pain without sciatica  Pelvic pain     Problem List Patient Active Problem List   Diagnosis Date Noted  . Sjogren's syndrome (HCC) 08/13/2019  . Psoriasis 07/15/2019  . ANA positive 06/10/2019  . Supervision of other normal pregnancy, antepartum 06/05/2019  . History of postpartum depression, currently pregnant 10/28/2017  . History of cesarean delivery, antepartum 10/28/2017  . Recurrent pregnancy loss 08/21/2017    Eulis Foster, PT 11/25/19 11:49 AM   [facility] [address] [place], [place], [address] Phone: [phone]   Fax:  [phone]  Name: Renika Shiflet MRN: [id number] Date of Birth: 1995/05/21

## 2019-11-25 NOTE — Patient Instructions (Addendum)
  As you have a contraction fiance will compress the hips together  On hands and knees with sheet wrap around pelvis, compress the pelvis with sheet and gently pull away spine during contractions  ON hands and knees, during contraction gentle pressure on the sacrum( just above tailbone)  Deep squat opens the top of the pelvis to allow the baby entering  High squat will open the bottom of the pelvis to assist moving out of the canal  Information taking from [facility] [address], [address] [place], [place] [address] Phone # [phone] Fax [phone]  Access Code: [email or web address] URL: [email or web address] Date: 11/25/2019 Prepared by: Eulis Foster  Exercises 90/90 SI Joint Self-Correction - 1 x daily - 7 x weekly - 1 sets - 3 reps - 5 sec hold Sidelying Thoracic Rotation with Open Book - 1 x daily - 7 x weekly - 1 sets - 5 reps

## 2019-12-01 ENCOUNTER — Other Ambulatory Visit: Payer: Self-pay

## 2019-12-01 ENCOUNTER — Other Ambulatory Visit (HOSPITAL_COMMUNITY)
Admission: RE | Admit: 2019-12-01 | Discharge: 2019-12-01 | Disposition: A | Discharge status: Home / Self Care | Payer: Medicaid Other | Source: Ambulatory Visit | Attending: Advanced Practice Midwife | Admitting: Advanced Practice Midwife

## 2019-12-01 ENCOUNTER — Ambulatory Visit (INDEPENDENT_AMBULATORY_CARE_PROVIDER_SITE_OTHER): Payer: Medicaid Other | Admitting: Advanced Practice Midwife

## 2019-12-01 VITALS — BP 113/73 | HR 77 | Wt 225.0 lb

## 2019-12-01 DIAGNOSIS — Z3A35 35 weeks gestation of pregnancy: Secondary | ICD-10-CM

## 2019-12-01 DIAGNOSIS — O479 False labor, unspecified: Secondary | ICD-10-CM

## 2019-12-01 DIAGNOSIS — Z3480 Encounter for supervision of other normal pregnancy, unspecified trimester: Secondary | ICD-10-CM | POA: Insufficient documentation

## 2019-12-01 DIAGNOSIS — O34219 Maternal care for unspecified type scar from previous cesarean delivery: Secondary | ICD-10-CM

## 2019-12-01 LAB — POCT URINALYSIS DIPSTICK OB
Bilirubin, UA: NEGATIVE
Blood, UA: NEGATIVE
Glucose, UA: NEGATIVE
Ketones, UA: NEGATIVE
Leukocytes, UA: NEGATIVE
Nitrite, UA: NEGATIVE
POC,PROTEIN,UA: NEGATIVE
Spec Grav, UA: 1.015 (ref 1.010–1.025)
Urobilinogen, UA: 0.2 E.U./dL
pH, UA: 5 (ref 5.0–8.0)

## 2019-12-01 NOTE — Progress Notes (Signed)
Pt states she had contractions frequently yesterday but, they have become less often now.

## 2019-12-01 NOTE — Progress Notes (Signed)
   PRENATAL VISIT NOTE  Subjective:  Laura [name] is a 24 y.o. [phone] at [redacted]w[redacted]d being seen today for ongoing prenatal care.  She is currently monitored for the following issues for this low-risk pregnancy and has Recurrent pregnancy loss; History of postpartum depression, currently pregnant; History of cesarean delivery, antepartum; Supervision of other normal pregnancy, antepartum; ANA positive; Psoriasis; and Sjogren's syndrome (HCC) on their problem list.  Patient reports frequent irregular contractions.  Contractions: Irregular. Vag. Bleeding: None.  Movement: Present. Denies leaking of fluid.   The following portions of the patient's history were reviewed and updated as appropriate: allergies, current medications, past family history, past medical history, past social history, past surgical history and problem list.   Objective:   Vitals:   12/01/19 1421  BP: 113/73  Pulse: 77  Weight: 225 lb (102.1 kg)    Fetal Status: Fetal Heart Rate (bpm): 136   Movement: Present  Presentation: Vertex  General:  Alert, oriented and cooperative. Patient is in no acute distress.  Skin: Skin is warm and dry. No rash noted.   Cardiovascular: Normal heart rate noted  Respiratory: Normal respiratory effort, no problems with respiration noted  Abdomen: Soft, gravid, appropriate for gestational age.  Pain/Pressure: Present     Pelvic: Cervical exam performed in the presence of a chaperone Dilation: Closed Effacement (%): Thick Station: -3  Extremities: Normal range of motion.  Edema: Trace  Mental Status: Normal mood and affect. Normal behavior. Normal judgment and thought content.   Assessment and Plan:  Pregnancy: [id number] at [redacted]w[redacted]d 1. Supervision of other normal pregnancy, antepartum --Anticipatory guidance about next visits/weeks of pregnancy given. --Next visit in 1 week scheduled, keep this visit  - Culture, beta strep (group b only) - Cervicovaginal ancillary only( [facility]) --Urine dip wnl today  2. [redacted] weeks gestation of pregnancy   3. History of cesarean delivery, antepartum   4. Braxton Hicks contractions --Pt made appt today due to irregular but painful frequent contractions that started yesterday. She reports they are better today than last night but still present.   --Cervix closed/thick/posterior.  No evidence of preterm labor.  Preterm labor precautions reviewed. --Keep visit scheduled next week for follow up  Preterm labor symptoms and general obstetric precautions including but not limited to vaginal bleeding, contractions, leaking of fluid and fetal movement were reviewed in detail with the patient. Please refer to After Visit Summary for other counseling recommendations.   No follow-ups on file.  Future Appointments  Date Time Provider Department Center  12/03/2019 11:45 AM Theressa Millard, PT [facility]  12/07/2019 11:15 AM Penne Lash Fredrich Romans, MD [facility]    Sharen Counter, CNM

## 2019-12-01 NOTE — Patient Instructions (Signed)
Reasons to come to MAU at [facility]:  Since you are preterm, come to MAU if:  1.  Contractions are 10 minutes apart or less and they becoming more uncomfortable or painful over time 2.  You have a large gush of fluid, or a trickle of fluid that will not stop and you have to wear a pad 3.  You have bleeding that is bright red, heavier than spotting--like menstrual bleeding (spotting can be normal in early labor or after a check of your cervix) 4.  You do not feel the baby moving like he/she normally does

## 2019-12-02 LAB — CERVICOVAGINAL ANCILLARY ONLY
Chlamydia: NEGATIVE
Comment: NEGATIVE
Comment: NORMAL
Neisseria Gonorrhea: NEGATIVE

## 2019-12-03 ENCOUNTER — Encounter: Payer: Self-pay | Admitting: Physical Therapy

## 2019-12-03 ENCOUNTER — Other Ambulatory Visit: Payer: Self-pay

## 2019-12-03 ENCOUNTER — Ambulatory Visit: Payer: Medicaid Other | Admitting: Physical Therapy

## 2019-12-03 DIAGNOSIS — M545 Low back pain, unspecified: Secondary | ICD-10-CM

## 2019-12-03 DIAGNOSIS — R102 Pelvic and perineal pain: Secondary | ICD-10-CM | POA: Diagnosis not present

## 2019-12-03 NOTE — Patient Instructions (Addendum)
° ° °  Try these techniques to guard against pain after abdominal surgery:  Squeeze a pillow to your chest when coughing.  To get out of bed, squeeze a pillow to your chest and roll onto your side first.  Then, push up on your elbow to sit up while still hugging the pillow with the other arm.  To move to the edge of the bed, continue to hug the pillow while you scoot your hips to the edge of the bed.  If you must use your hands to assist with standing up, place your hands on your knees, lean forward, and stand up using your legs primarily.  Place as little weight on your arms as possible. Laura [name], PT - [facility] [address] [place], [address], [place] [address], [phone]  Good to wear belly binder for 2 weeks after c-section

## 2019-12-03 NOTE — Therapy (Signed)
[facility] [address] [place], [place], [address] Phone: [phone]   Fax:  [phone]  Physical Therapy Treatment  Patient Details  Name: Laura [name] MRN: [id number] Date of Birth: Dec 09, 1995 Referring Provider (PT): Dr. Silas Sacramento   Encounter Date: 12/03/2019   PT End of Session - 12/03/19 1151    Visit Number 4    Date for PT Re-Evaluation 12/16/19    Authorization Type [employer]/ Medicaid    Authorization - Visit Number 3    Authorization - Number of Visits 27    PT Start Time 8299    PT Stop Time 1223    PT Time Calculation (min) 38 min    Activity Tolerance Patient tolerated treatment well;No increased pain    Behavior During Therapy [facility] for tasks assessed/performed           Past Medical History:  Diagnosis Date   Anxiety    Depression    since 12/18 from [place], PPD   Hydrocephalus ([place])    Meningitis    PTSD (post-traumatic stress disorder) 2019   Sjogren's syndrome ([place])    Spinal headache     Past Surgical History:  Procedure Laterality Date   APPENDECTOMY     CESAREAN SECTION N/A 12/30/2015   Procedure: CESAREAN SECTION;  Surgeon: Woodroe Mode, MD;  Location: [place];  Service: Obstetrics;  Laterality: N/A;   CESAREAN SECTION N/A 04/05/2018   Procedure: CESAREAN SECTION;  Surgeon: Mora Bellman, MD;  Location: [place];  Service: Obstetrics;  Laterality: N/A;   CHOLECYSTECTOMY     spnal tap     URETHRA SURGERY      There were no vitals filed for this visit.   Subjective Assessment - 12/03/19 1149    Subjective I am having the low abdominal contractions. I am 36 weeks today. I am uncomfortable due to the pain. My pelvis is not popping. I have alot of pressure.    Patient Stated Goals reduce pain and improve function    Currently in Pain? No/denies              [facility] PT Assessment - 12/03/19 0001      Assessment   Medical Diagnosis  Z34.80 Supervision of othe normal pregnancy antepartum; M54.42, M54.41 Chronic bilateral low back pain with bilateral sciatica    Referring Provider (PT) Dr. Silas [place]    Onset Date/Surgical Date 07/16/19    Prior Therapy none      Precautions   Precautions None      Restrictions   Weight Bearing Restrictions No      Home Environment   Living Environment Private residence      Prior Function   Level of Independence Independent      Cognition   Overall Cognitive Status Within Functional Limits for tasks assessed      Posture/Postural Control   Posture/Postural Control Postural limitations    Posture Comments pregnant posture      ROM / Strength   AROM / PROM / Strength AROM;PROM;Strength      Strength   Right Hip Flexion 4/5    Left Hip Flexion 4/5      Palpation   SI assessment  ASIS are equal                         [facility] Adult PT Treatment/Exercise - 12/03/19 0001      Self-Care   Self-Care Other Self-Care  Comments;Scar Mobilizations    Scar Mobilizations eduation on perineal massage and taking care of perineal scar if she tears after birth    Other Self-Care Comments  educated patient on c-section scar and how to progress, the type of pressure and ways to desensitize the scar      Therapeutic Activites    Therapeutic Activities Other Therapeutic Activities    Other Therapeutic Activities education on how to brace he abdomen when she has a c-section  with coughing, sneezing, and laughing, and brace with daily tasks. education on what she can do after giving birth and is she needs therapy she can get it.                   PT Education - 12/03/19 1226    Education Details education on how to brace with a pillow with activites, brace abdominals with activities, taking care of c-section scar, how to perform perinal massage.    Person(s) Educated Patient    Methods Explanation;Demonstration;Handout    Comprehension Returned  demonstration;Verbalized understanding            PT Short Term Goals - 11/25/19 1148      PT SHORT TERM GOAL #1   Title independent with initial HEP    Time 4    Period Weeks    Status Achieved             PT Long Term Goals - 12/03/19 1228      PT LONG TERM GOAL #1   Title independent with advanced HEP    Time 12    Period Weeks    Status Achieved      PT LONG TERM GOAL #2   Title understand ways to move in bed with a pillow between knees to reduce strain on the SI joint    Time 12    Period Weeks    Status Achieved      PT LONG TERM GOAL #3   Title understand how to manage pain with modifing her home tasks, reducing single leg stance activities    Time 12    Period Weeks    Status Achieved      PT LONG TERM GOAL #4   Title understand ways to lift her 47 year old child correctly with decreased strain on lumbar and SI joint    Time 12    Period Weeks    Status Achieved      PT LONG TERM GOAL #5   Title pain level stays around 4/10 75% of the time instead of being a 7/10    Time 12    Period Weeks    Status Achieved                 Plan - 12/03/19 1151    Clinical Impression Statement Patient was educated on taking care of C-section scar, perineal scar if happens, perineal massage to reduce chance of tearing, and body mechanics. Patient is not having as much pain except for contractions. Patient was educated on how to manage her labor pain and what her partner can do for her. Patient is ready for discharge.    Personal Factors and Comorbidities Comorbidity 1;Past/Current Experience    Comorbidities presently pregnant ;past has had 2 c-sections    Examination-Activity Limitations Bed Mobility;Locomotion Level;Transfers;Caring for Others;Carry;Sleep;Stairs;Stand;Lift    Examination-Participation Restrictions Meal Prep;Cleaning;Community Activity    Stability/Clinical Decision Making Evolving/Moderate complexity    Rehab Potential Good    PT Frequency  --  PT Duration --    PT Treatment/Interventions Cryotherapy;Moist Heat;Functional mobility training;Therapeutic activities;Therapeutic exercise;Neuromuscular re-education;Manual techniques;Patient/family education;Spinal Manipulations;Taping    PT Next Visit Plan Discharge to HEP    PT Home Exercise Plan Access Code: [email or web address]    Consulted and Agree with Plan of Care Patient           Patient will benefit from skilled therapeutic intervention in order to improve the following deficits and impairments:  Increased fascial restricitons, Decreased endurance, Increased muscle spasms, Pain, Decreased activity tolerance, Decreased strength  Visit Diagnosis: Acute bilateral low back pain without sciatica  Pelvic pain     Problem List Patient Active Problem List   Diagnosis Date Noted   Sjogren's syndrome ([place]) 08/13/2019   Psoriasis 07/15/2019   ANA positive 06/10/2019   Supervision of other normal pregnancy, antepartum 06/05/2019   History of postpartum depression, currently pregnant 10/28/2017   History of cesarean delivery, antepartum 10/28/2017   Recurrent pregnancy loss 08/21/2017    Earlie Counts, PT 12/03/19 12:32 PM   [facility] [address] [place], [place], [address] Phone: [phone]   Fax:  [phone]  Name: Laura [name] MRN: [id number] Date of Birth: 1995-10-23  PHYSICAL THERAPY DISCHARGE SUMMARY  Visits from Start of Care: 4  Current functional level related to goals / functional outcomes: See above.    Remaining deficits: See above.    Education / Equipment: HEP  Plan: Patient agrees to discharge.  Patient goals were met. Patient is being discharged due to meeting the stated rehab goals. Thank you for referral. Earlie Counts, PT 12/03/19 12:32 PM   ?????

## 2019-12-04 LAB — CULTURE, BETA STREP (GROUP B ONLY)
MICRO NUMBER:: 10952046
SPECIMEN QUALITY:: ADEQUATE

## 2019-12-07 ENCOUNTER — Encounter: Payer: Self-pay | Admitting: Obstetrics & Gynecology

## 2019-12-07 ENCOUNTER — Other Ambulatory Visit: Payer: Self-pay

## 2019-12-07 ENCOUNTER — Ambulatory Visit (INDEPENDENT_AMBULATORY_CARE_PROVIDER_SITE_OTHER): Payer: Medicaid Other | Admitting: Obstetrics & Gynecology

## 2019-12-07 VITALS — BP 106/66 | HR 89 | Wt 228.0 lb

## 2019-12-07 DIAGNOSIS — Z3A36 36 weeks gestation of pregnancy: Secondary | ICD-10-CM

## 2019-12-07 DIAGNOSIS — O34219 Maternal care for unspecified type scar from previous cesarean delivery: Secondary | ICD-10-CM

## 2019-12-07 NOTE — Progress Notes (Deleted)
   Covid-19 Vaccination Clinic  Name:  Laura [name]    MRN: [id number] DOB: Dec 19, 1995  04/12/2019  [name] was observed post Covid-19 immunization for {COVID Vaccine Observation Times:23551} without incidence. She was provided with Vaccine Information Sheet and instruction to access the V-Safe system.   [name] was instructed to call 911 with any severe reactions post vaccine: Marland Kitchen Difficulty breathing  . Swelling of your face and throat  . A fast heartbeat  . A bad rash all over your body  . Dizziness and weakness      Documented on behalf of:  {KKVACCINE:23624}

## 2019-12-07 NOTE — Progress Notes (Signed)
Patient ID: Laura [name], female   DOB: 15-Nov-1995, 24 y.o.   MRN: [id number]    PRENATAL VISIT NOTE  Subjective:  Laura [name] is a 24 y.o. H4T6546 at [redacted]w[redacted]d being seen today for ongoing prenatal care.  She is currently monitored for the following issues for this high-risk pregnancy and has Recurrent pregnancy loss; History of postpartum depression, currently pregnant; History of cesarean delivery, antepartum; Supervision of other normal pregnancy, antepartum; ANA positive; Psoriasis; and Sjogren's syndrome (HCC) on their problem list.  Patient reports pelvic pressure.  Contractions: Irregular. Vag. Bleeding: None.  Movement: Present. Denies leaking of fluid.   The following portions of the patient's history were reviewed and updated as appropriate: allergies, current medications, past family history, past medical history, past social history, past surgical history and problem list.   Objective:   Vitals:   12/07/19 1428  BP: 106/66  Pulse: 89  Weight: 228 lb (103.4 kg)    Fetal Status: Fetal Heart Rate (bpm): 146   Movement: Present     General:  Alert, oriented and cooperative. Patient is in no acute distress.  Skin: Skin is warm and dry. No rash noted.   Cardiovascular: Normal heart rate noted  Respiratory: Normal respiratory effort, no problems with respiration noted  Abdomen: Soft, gravid, appropriate for gestational age.  Pain/Pressure: Present     Pelvic: Cervical exam deferred        Extremities: Normal range of motion.  Edema: Trace  Mental Status: Normal mood and affect. Normal behavior. Normal judgment and thought content.   Assessment and Plan:  Pregnancy: [id number] at [redacted]w[redacted]d 1. [redacted] weeks gestation of pregnancy GBS negative Takes BP at home, doesn't always log it.   Still wants VBAC and BTL (papers signed)  Preterm labor symptoms and general obstetric precautions including but not limited to vaginal bleeding, contractions, leaking of fluid and fetal  movement were reviewed in detail with the patient. Please refer to After Visit Summary for other counseling recommendations.   No follow-ups on file.  Future Appointments  Date Time Provider Department Center  12/21/2019 10:45 AM Lesly Dukes, MD [facility]    Elsie Lincoln, MD

## 2019-12-07 NOTE — Patient Instructions (Signed)
It is safe to sexual intercourse during pregnancy until delivery.

## 2019-12-17 ENCOUNTER — Encounter (HOSPITAL_COMMUNITY): Payer: Self-pay

## 2019-12-17 NOTE — Patient Instructions (Addendum)
Adelisa Satterwhite  12/17/2019   Your procedure is scheduled on:  12/26/2019  Arrive at 0745 at Entrance C on [employer] at [facility]  and [employer]. You are invited to use the FREE valet parking or use the Visitor's parking deck.  Pick up the phone at the desk and dial [phone].  Call this number if you have problems the morning of surgery: [phone]  Remember:   Do not eat food:(After Midnight) Desps de medianoche.  Do not drink clear liquids: (After Midnight) Desps de medianoche.  Take these medicines the morning of surgery with A SIP OF WATER:  Take zoloft as prescribed   Do not wear jewelry, make-up or nail polish.  Do not wear lotions, powders, or perfumes. Do not wear deodorant.  Do not shave 48 hours prior to surgery.  Do not bring valuables to the hospital.  [facility] is not   responsible for any belongings or valuables brought to the hospital.  Contacts, dentures or bridgework may not be worn into surgery.  Leave suitcase in the car. After surgery it may be brought to your room.  For patients admitted to the hospital, checkout time is 11:00 AM the day of              discharge.      Please read over the following fact sheets that you were given:     Preparing for Surgery

## 2019-12-20 ENCOUNTER — Inpatient Hospital Stay (INDEPENDENT_AMBULATORY_CARE_PROVIDER_SITE_OTHER)
Admission: AD | Admit: 2019-12-20 | Discharge: 2019-12-21 | Disposition: A | Discharge status: Home / Self Care | Payer: Medicaid Other | Source: Home / Self Care | Attending: Obstetrics & Gynecology | Admitting: Obstetrics & Gynecology

## 2019-12-20 DIAGNOSIS — Z3A38 38 weeks gestation of pregnancy: Secondary | ICD-10-CM

## 2019-12-20 DIAGNOSIS — Z79899 Other long term (current) drug therapy: Secondary | ICD-10-CM | POA: Insufficient documentation

## 2019-12-20 DIAGNOSIS — F32A Depression, unspecified: Secondary | ICD-10-CM | POA: Insufficient documentation

## 2019-12-20 DIAGNOSIS — Z87891 Personal history of nicotine dependence: Secondary | ICD-10-CM | POA: Insufficient documentation

## 2019-12-20 DIAGNOSIS — Z885 Allergy status to narcotic agent status: Secondary | ICD-10-CM | POA: Insufficient documentation

## 2019-12-20 DIAGNOSIS — R030 Elevated blood-pressure reading, without diagnosis of hypertension: Secondary | ICD-10-CM | POA: Insufficient documentation

## 2019-12-20 DIAGNOSIS — O99343 Other mental disorders complicating pregnancy, third trimester: Secondary | ICD-10-CM | POA: Insufficient documentation

## 2019-12-20 DIAGNOSIS — O26893 Other specified pregnancy related conditions, third trimester: Secondary | ICD-10-CM

## 2019-12-20 DIAGNOSIS — R519 Headache, unspecified: Secondary | ICD-10-CM | POA: Insufficient documentation

## 2019-12-21 ENCOUNTER — Ambulatory Visit (INDEPENDENT_AMBULATORY_CARE_PROVIDER_SITE_OTHER): Payer: Medicaid Other | Admitting: Obstetrics & Gynecology

## 2019-12-21 ENCOUNTER — Encounter (HOSPITAL_COMMUNITY): Payer: Self-pay | Admitting: Obstetrics and Gynecology

## 2019-12-21 ENCOUNTER — Encounter (HOSPITAL_COMMUNITY): Payer: Self-pay | Admitting: Obstetrics & Gynecology

## 2019-12-21 ENCOUNTER — Inpatient Hospital Stay (HOSPITAL_COMMUNITY): Payer: Medicaid Other | Admitting: Anesthesiology

## 2019-12-21 ENCOUNTER — Telehealth: Payer: Self-pay | Admitting: Obstetrics & Gynecology

## 2019-12-21 ENCOUNTER — Other Ambulatory Visit: Payer: Self-pay

## 2019-12-21 ENCOUNTER — Encounter (HOSPITAL_COMMUNITY)
Admission: AD | Disposition: A | Discharge status: Home / Self Care | Payer: Self-pay | Source: Home / Self Care | Attending: Obstetrics and Gynecology

## 2019-12-21 ENCOUNTER — Inpatient Hospital Stay (HOSPITAL_COMMUNITY)
Admission: AD | Admit: 2019-12-21 | Discharge: 2019-12-24 | DRG: 785 | Disposition: A | Discharge status: Home / Self Care | Payer: Medicaid Other | Attending: Obstetrics and Gynecology | Admitting: Obstetrics and Gynecology

## 2019-12-21 VITALS — BP 116/70 | HR 78 | Wt 231.0 lb

## 2019-12-21 DIAGNOSIS — Z20822 Contact with and (suspected) exposure to covid-19: Secondary | ICD-10-CM | POA: Diagnosis present

## 2019-12-21 DIAGNOSIS — Z9851 Tubal ligation status: Secondary | ICD-10-CM

## 2019-12-21 DIAGNOSIS — O1414 Severe pre-eclampsia complicating childbirth: Secondary | ICD-10-CM | POA: Diagnosis present

## 2019-12-21 DIAGNOSIS — D649 Anemia, unspecified: Secondary | ICD-10-CM | POA: Diagnosis not present

## 2019-12-21 DIAGNOSIS — Z87891 Personal history of nicotine dependence: Secondary | ICD-10-CM | POA: Diagnosis not present

## 2019-12-21 DIAGNOSIS — Z3480 Encounter for supervision of other normal pregnancy, unspecified trimester: Secondary | ICD-10-CM

## 2019-12-21 DIAGNOSIS — E669 Obesity, unspecified: Secondary | ICD-10-CM | POA: Diagnosis not present

## 2019-12-21 DIAGNOSIS — O26893 Other specified pregnancy related conditions, third trimester: Secondary | ICD-10-CM | POA: Diagnosis not present

## 2019-12-21 DIAGNOSIS — Z302 Encounter for sterilization: Secondary | ICD-10-CM | POA: Diagnosis not present

## 2019-12-21 DIAGNOSIS — Z3A38 38 weeks gestation of pregnancy: Secondary | ICD-10-CM

## 2019-12-21 DIAGNOSIS — Z3490 Encounter for supervision of normal pregnancy, unspecified, unspecified trimester: Secondary | ICD-10-CM

## 2019-12-21 DIAGNOSIS — O99891 Other specified diseases and conditions complicating pregnancy: Secondary | ICD-10-CM

## 2019-12-21 DIAGNOSIS — M3500 Sicca syndrome, unspecified: Secondary | ICD-10-CM | POA: Diagnosis present

## 2019-12-21 DIAGNOSIS — R768 Other specified abnormal immunological findings in serum: Secondary | ICD-10-CM | POA: Diagnosis present

## 2019-12-21 DIAGNOSIS — Z8659 Personal history of other mental and behavioral disorders: Secondary | ICD-10-CM

## 2019-12-21 DIAGNOSIS — O34219 Maternal care for unspecified type scar from previous cesarean delivery: Secondary | ICD-10-CM | POA: Diagnosis not present

## 2019-12-21 DIAGNOSIS — O9902 Anemia complicating childbirth: Secondary | ICD-10-CM | POA: Diagnosis not present

## 2019-12-21 DIAGNOSIS — O1415 Severe pre-eclampsia, complicating the puerperium: Secondary | ICD-10-CM | POA: Diagnosis not present

## 2019-12-21 DIAGNOSIS — O99214 Obesity complicating childbirth: Secondary | ICD-10-CM | POA: Diagnosis not present

## 2019-12-21 DIAGNOSIS — O34211 Maternal care for low transverse scar from previous cesarean delivery: Principal | ICD-10-CM | POA: Diagnosis present

## 2019-12-21 DIAGNOSIS — R519 Headache, unspecified: Secondary | ICD-10-CM | POA: Diagnosis not present

## 2019-12-21 DIAGNOSIS — N96 Recurrent pregnancy loss: Secondary | ICD-10-CM | POA: Diagnosis present

## 2019-12-21 DIAGNOSIS — Z3A00 Weeks of gestation of pregnancy not specified: Secondary | ICD-10-CM | POA: Diagnosis not present

## 2019-12-21 LAB — CBC
HCT: 33.3 % — ABNORMAL LOW (ref 36.0–46.0)
HCT: 34.2 % — ABNORMAL LOW (ref 36.0–46.0)
Hemoglobin: 11.1 g/dL — ABNORMAL LOW (ref 12.0–15.0)
Hemoglobin: 11.2 g/dL — ABNORMAL LOW (ref 12.0–15.0)
MCH: 29.3 pg (ref 26.0–34.0)
MCH: 29.7 pg (ref 26.0–34.0)
MCHC: 33.3 g/dL (ref 30.0–36.0)
MCHC: 32.7 g/dL (ref 30.0–36.0)
MCV: 87.9 fL (ref 80.0–100.0)
MCV: 90.7 fL (ref 80.0–100.0)
Platelets: 213 10*3/uL (ref 150–400)
Platelets: 227 10*3/uL (ref 150–400)
RBC: 3.79 MIL/uL — ABNORMAL LOW (ref 3.87–5.11)
RBC: 3.77 MIL/uL — ABNORMAL LOW (ref 3.87–5.11)
RDW: 13.5 % (ref 11.5–15.5)
RDW: 13.5 % (ref 11.5–15.5)
WBC: 7.5 10*3/uL (ref 4.0–10.5)
WBC: 6.9 10*3/uL (ref 4.0–10.5)
nRBC: 0 % (ref 0.0–0.2)
nRBC: 0 % (ref 0.0–0.2)

## 2019-12-21 LAB — COMPREHENSIVE METABOLIC PANEL
ALT: 11 U/L (ref 0–44)
AST: 15 U/L (ref 15–41)
Albumin: 3 g/dL — ABNORMAL LOW (ref 3.5–5.0)
Alkaline Phosphatase: 75 U/L (ref 38–126)
Anion gap: 11 (ref 5–15)
BUN: <5 mg/dL — ABNORMAL LOW (ref 6–20)
CO2: 18 mmol/L — ABNORMAL LOW (ref 22–32)
Calcium: 9.1 mg/dL (ref 8.9–10.3)
Chloride: 107 mmol/L (ref 98–111)
Creatinine, Ser: 0.56 mg/dL (ref 0.44–1.00)
GFR calc Af Amer: >60 mL/min (ref >60)
GFR calc non Af Amer: >60 mL/min (ref >60)
Glucose, Bld: 92 mg/dL (ref 70–99)
Potassium: 3.6 mmol/L (ref 3.5–5.1)
Sodium: 136 mmol/L (ref 135–145)
Total Bilirubin: 0.7 mg/dL (ref 0.3–1.2)
Total Protein: 6.5 g/dL (ref 6.5–8.1)

## 2019-12-21 LAB — RESPIRATORY PANEL BY RT PCR (FLU A&B, COVID)
Influenza A by PCR: NEGATIVE
Influenza B by PCR: NEGATIVE
SARS Coronavirus 2 by RT PCR: NEGATIVE

## 2019-12-21 LAB — PROTEIN / CREATININE RATIO, URINE
Creatinine, Urine: 41.42 mg/dL
Total Protein, Urine: <6 mg/dL

## 2019-12-21 LAB — TYPE AND SCREEN
ABO/RH(D): A POS
Antibody Screen: NEGATIVE

## 2019-12-21 SURGERY — Surgical Case
Anesthesia: Spinal | Wound class: Clean Contaminated

## 2019-12-21 MED ORDER — KETOROLAC TROMETHAMINE 30 MG/ML IJ SOLN: 30.0000 mg | Freq: Once | INTRAMUSCULAR | Status: DC

## 2019-12-21 MED ORDER — MORPHINE SULFATE (PF) 0.5 MG/ML IJ SOLN
INTRAMUSCULAR | Status: DC | PRN
Start: 2019-12-21 — End: 2019-12-21
  Administered 2019-12-21: .15 mg via EPIDURAL

## 2019-12-21 MED ORDER — PROMETHAZINE HCL 25 MG/ML IJ SOLN: 6.2500 mg | INTRAMUSCULAR | Status: DC | PRN

## 2019-12-21 MED ORDER — ACETAMINOPHEN 500 MG PO TABS
1000.0000 mg | ORAL_TABLET | Freq: Four times a day (QID) | ORAL | Status: DC
  Administered 2019-12-21 – 2019-12-24 (×9): 1000 mg via ORAL
  Filled 2019-12-21 (×10): qty 2

## 2019-12-21 MED ORDER — OXYTOCIN-SODIUM CHLORIDE 30-0.9 UT/500ML-% IV SOLN
INTRAVENOUS | Status: DC | PRN
  Administered 2019-12-21: 30 [IU] via INTRAVENOUS

## 2019-12-21 MED ORDER — MAGNESIUM SULFATE 40 GM/1000ML IV SOLN
INTRAVENOUS | Status: AC
  Filled 2019-12-21: qty 1000

## 2019-12-21 MED ORDER — SIMETHICONE 80 MG PO CHEW
80.0000 mg | CHEWABLE_TABLET | Freq: Three times a day (TID) | ORAL | Status: DC
  Administered 2019-12-22 – 2019-12-24 (×7): 80 mg via ORAL
  Filled 2019-12-21 (×7): qty 1

## 2019-12-21 MED ORDER — LABETALOL HCL 5 MG/ML IV SOLN: 20.0000 mg | INTRAVENOUS | Status: DC | PRN

## 2019-12-21 MED ORDER — ONDANSETRON HCL 4 MG/2ML IJ SOLN
INTRAMUSCULAR | Status: DC | PRN
  Administered 2019-12-21: 4 mg via INTRAVENOUS

## 2019-12-21 MED ORDER — DEXAMETHASONE SODIUM PHOSPHATE 10 MG/ML IJ SOLN
INTRAMUSCULAR | Status: DC | PRN
  Administered 2019-12-21: 10 mg via INTRAVENOUS

## 2019-12-21 MED ORDER — HYDROMORPHONE HCL 1 MG/ML IJ SOLN: 0.2000 mg | INTRAMUSCULAR | Status: DC | PRN

## 2019-12-21 MED ORDER — SODIUM CHLORIDE 0.9% FLUSH: 3.0000 mL | INTRAVENOUS | Status: DC | PRN

## 2019-12-21 MED ORDER — NALBUPHINE HCL 10 MG/ML IJ SOLN: 5.0000 mg | INTRAMUSCULAR | Status: DC | PRN

## 2019-12-21 MED ORDER — MAGNESIUM SULFATE 40 GM/1000ML IV SOLN
2.0000 g/h | INTRAVENOUS | Status: DC
  Administered 2019-12-21: 2 g/h via INTRAVENOUS

## 2019-12-21 MED ORDER — MORPHINE SULFATE (PF) 0.5 MG/ML IJ SOLN
INTRAMUSCULAR | Status: AC
  Filled 2019-12-21: qty 10

## 2019-12-21 MED ORDER — ACETAMINOPHEN 10 MG/ML IV SOLN
1000.0000 mg | Freq: Once | INTRAVENOUS | Status: AC
  Administered 2019-12-21: 1000 mg via INTRAVENOUS

## 2019-12-21 MED ORDER — IBUPROFEN 800 MG PO TABS
800.0000 mg | ORAL_TABLET | Freq: Four times a day (QID) | ORAL | Status: DC
  Administered 2019-12-23 – 2019-12-24 (×6): 800 mg via ORAL
  Filled 2019-12-21 (×6): qty 1

## 2019-12-21 MED ORDER — NALOXONE HCL 4 MG/10ML IJ SOLN
1.0000 ug/kg/h | INTRAVENOUS | Status: DC | PRN
  Filled 2019-12-21: qty 5

## 2019-12-21 MED ORDER — HYDRALAZINE HCL 20 MG/ML IJ SOLN: 10.0000 mg | INTRAMUSCULAR | Status: DC | PRN

## 2019-12-21 MED ORDER — NALBUPHINE HCL 10 MG/ML IJ SOLN
5.0000 mg | INTRAMUSCULAR | Status: DC | PRN
  Administered 2019-12-22 (×2): 5 mg via INTRAVENOUS
  Filled 2019-12-21 (×2): qty 1

## 2019-12-21 MED ORDER — PHENYLEPHRINE HCL-NACL 20-0.9 MG/250ML-% IV SOLN
INTRAVENOUS | Status: DC | PRN
  Administered 2019-12-21: 60 ug/min via INTRAVENOUS

## 2019-12-21 MED ORDER — FENTANYL CITRATE (PF) 100 MCG/2ML IJ SOLN
INTRAMUSCULAR | Status: AC
  Filled 2019-12-21: qty 2

## 2019-12-21 MED ORDER — PHENYLEPHRINE HCL-NACL 20-0.9 MG/250ML-% IV SOLN
INTRAVENOUS | Status: AC
  Filled 2019-12-21: qty 250

## 2019-12-21 MED ORDER — OXYTOCIN-SODIUM CHLORIDE 30-0.9 UT/500ML-% IV SOLN
INTRAVENOUS | Status: AC
  Filled 2019-12-21: qty 500

## 2019-12-21 MED ORDER — TETANUS-DIPHTH-ACELL PERTUSSIS 5-2.5-18.5 LF-MCG/0.5 IM SUSP: 0.5000 mL | Freq: Once | INTRAMUSCULAR | Status: DC

## 2019-12-21 MED ORDER — NALBUPHINE HCL 10 MG/ML IJ SOLN: 5.0000 mg | Freq: Once | INTRAMUSCULAR | Status: AC | PRN

## 2019-12-21 MED ORDER — FENTANYL CITRATE (PF) 100 MCG/2ML IJ SOLN
INTRAMUSCULAR | Status: DC | PRN
Start: 2019-12-21 — End: 2019-12-21
  Administered 2019-12-21: 15 ug via INTRATHECAL

## 2019-12-21 MED ORDER — SODIUM CHLORIDE 0.9 % IR SOLN
Status: DC | PRN
  Administered 2019-12-21: 1

## 2019-12-21 MED ORDER — CEFAZOLIN SODIUM-DEXTROSE 2-4 GM/100ML-% IV SOLN
2.0000 g | INTRAVENOUS | Status: AC
  Administered 2019-12-21: 2 g via INTRAVENOUS

## 2019-12-21 MED ORDER — OXYCODONE HCL 5 MG/5ML PO SOLN: 5.0000 mg | Freq: Once | ORAL | Status: DC | PRN

## 2019-12-21 MED ORDER — NALBUPHINE HCL 10 MG/ML IJ SOLN
INTRAMUSCULAR | Status: AC
  Filled 2019-12-21: qty 1

## 2019-12-21 MED ORDER — COCONUT OIL OIL
1.0000 "application " | TOPICAL_OIL | Status: DC | PRN
  Administered 2019-12-22: 1 via TOPICAL

## 2019-12-21 MED ORDER — NALBUPHINE HCL 10 MG/ML IJ SOLN
5.0000 mg | Freq: Once | INTRAMUSCULAR | Status: AC | PRN
  Administered 2019-12-21: 5 mg via INTRAVENOUS

## 2019-12-21 MED ORDER — SOD CITRATE-CITRIC ACID 500-334 MG/5ML PO SOLN
30.0000 mL | ORAL | Status: AC
  Administered 2019-12-21: 30 mL via ORAL

## 2019-12-21 MED ORDER — LACTATED RINGERS IV SOLN: INTRAVENOUS | Status: DC

## 2019-12-21 MED ORDER — PRENATAL MULTIVITAMIN CH
1.0000 | ORAL_TABLET | Freq: Every day | ORAL | Status: DC
  Administered 2019-12-22 – 2019-12-23 (×2): 1 via ORAL
  Filled 2019-12-21 (×2): qty 1

## 2019-12-21 MED ORDER — NALOXONE HCL 0.4 MG/ML IJ SOLN: 0.4000 mg | INTRAMUSCULAR | Status: DC | PRN

## 2019-12-21 MED ORDER — HYDROMORPHONE HCL 1 MG/ML IJ SOLN
INTRAMUSCULAR | Status: AC
  Filled 2019-12-21: qty 0.5

## 2019-12-21 MED ORDER — KETOROLAC TROMETHAMINE 30 MG/ML IJ SOLN
INTRAMUSCULAR | Status: DC | PRN
  Administered 2019-12-21: 30 mg via INTRAVENOUS

## 2019-12-21 MED ORDER — OXYCODONE HCL 5 MG PO TABS
5.0000 mg | ORAL_TABLET | ORAL | Status: DC | PRN
  Administered 2019-12-21: 10 mg via ORAL
  Administered 2019-12-22: 5 mg via ORAL
  Administered 2019-12-22: 10 mg via ORAL
  Administered 2019-12-23: 5 mg via ORAL
  Filled 2019-12-21: qty 2
  Filled 2019-12-21 (×2): qty 1
  Filled 2019-12-21: qty 2

## 2019-12-21 MED ORDER — SENNOSIDES-DOCUSATE SODIUM 8.6-50 MG PO TABS
2.0000 | ORAL_TABLET | ORAL | Status: DC
  Administered 2019-12-21 – 2019-12-23 (×3): 2 via ORAL
  Filled 2019-12-21 (×3): qty 2

## 2019-12-21 MED ORDER — ONDANSETRON HCL 4 MG/2ML IJ SOLN
INTRAMUSCULAR | Status: AC
  Filled 2019-12-21: qty 2

## 2019-12-21 MED ORDER — SCOPOLAMINE 1 MG/3DAYS TD PT72: 1.0000 | MEDICATED_PATCH | Freq: Once | TRANSDERMAL | Status: DC

## 2019-12-21 MED ORDER — MAGNESIUM SULFATE BOLUS VIA INFUSION
4.0000 g | Freq: Once | INTRAVENOUS | Status: AC
  Administered 2019-12-21: 4 g via INTRAVENOUS
  Filled 2019-12-21: qty 1000

## 2019-12-21 MED ORDER — ACETAMINOPHEN 500 MG PO TABS
1000.0000 mg | ORAL_TABLET | Freq: Once | ORAL | Status: AC
  Administered 2019-12-21: 1000 mg via ORAL
  Filled 2019-12-21: qty 2

## 2019-12-21 MED ORDER — MAGNESIUM SULFATE 40 GM/1000ML IV SOLN
2.0000 g/h | INTRAVENOUS | Status: AC
  Administered 2019-12-22: 2 g/h via INTRAVENOUS
  Filled 2019-12-21: qty 1000

## 2019-12-21 MED ORDER — ACETAMINOPHEN 500 MG PO TABS: 1000.0000 mg | ORAL_TABLET | Freq: Four times a day (QID) | ORAL | Status: DC

## 2019-12-21 MED ORDER — SOD CITRATE-CITRIC ACID 500-334 MG/5ML PO SOLN
ORAL | Status: AC
  Filled 2019-12-21: qty 30

## 2019-12-21 MED ORDER — DIPHENHYDRAMINE HCL 25 MG PO CAPS: 25.0000 mg | ORAL_CAPSULE | ORAL | Status: DC | PRN

## 2019-12-21 MED ORDER — DEXAMETHASONE SODIUM PHOSPHATE 10 MG/ML IJ SOLN
INTRAMUSCULAR | Status: AC
  Filled 2019-12-21: qty 1

## 2019-12-21 MED ORDER — STERILE WATER FOR IRRIGATION IR SOLN
Status: DC | PRN
  Administered 2019-12-21: 1

## 2019-12-21 MED ORDER — OXYCODONE HCL 5 MG PO TABS: 5.0000 mg | ORAL_TABLET | Freq: Once | ORAL | Status: DC | PRN

## 2019-12-21 MED ORDER — OXYTOCIN-SODIUM CHLORIDE 30-0.9 UT/500ML-% IV SOLN: 2.5000 [IU]/h | INTRAVENOUS | Status: AC

## 2019-12-21 MED ORDER — ENOXAPARIN SODIUM 60 MG/0.6ML ~~LOC~~ SOLN
50.0000 mg | SUBCUTANEOUS | Status: DC
  Administered 2019-12-22 – 2019-12-24 (×3): 50 mg via SUBCUTANEOUS
  Filled 2019-12-21 (×3): qty 0.6

## 2019-12-21 MED ORDER — LABETALOL HCL 5 MG/ML IV SOLN: 40.0000 mg | INTRAVENOUS | Status: DC | PRN

## 2019-12-21 MED ORDER — BUPIVACAINE IN DEXTROSE 0.75-8.25 % IT SOLN
INTRATHECAL | Status: DC | PRN
  Administered 2019-12-21: 1.6 mL via INTRATHECAL

## 2019-12-21 MED ORDER — SIMETHICONE 80 MG PO CHEW: 80.0000 mg | CHEWABLE_TABLET | ORAL | Status: DC | PRN

## 2019-12-21 MED ORDER — DIPHENHYDRAMINE HCL 25 MG PO CAPS: 25.0000 mg | ORAL_CAPSULE | Freq: Four times a day (QID) | ORAL | Status: DC | PRN

## 2019-12-21 MED ORDER — DIPHENHYDRAMINE HCL 50 MG/ML IJ SOLN: 12.5000 mg | INTRAMUSCULAR | Status: DC | PRN

## 2019-12-21 MED ORDER — HYDROMORPHONE HCL 1 MG/ML IJ SOLN
0.2500 mg | INTRAMUSCULAR | Status: DC | PRN
  Administered 2019-12-21: 0.5 mg via INTRAVENOUS

## 2019-12-21 MED ORDER — LABETALOL HCL 5 MG/ML IV SOLN: 80.0000 mg | INTRAVENOUS | Status: DC | PRN

## 2019-12-21 MED ORDER — WITCH HAZEL-GLYCERIN EX PADS: 1.0000 "application " | MEDICATED_PAD | CUTANEOUS | Status: DC | PRN

## 2019-12-21 MED ORDER — MENTHOL 3 MG MT LOZG: 1.0000 | LOZENGE | OROMUCOSAL | Status: DC | PRN

## 2019-12-21 MED ORDER — ACETAMINOPHEN 10 MG/ML IV SOLN
INTRAVENOUS | Status: AC
  Filled 2019-12-21: qty 100

## 2019-12-21 MED ORDER — KETOROLAC TROMETHAMINE 30 MG/ML IJ SOLN
30.0000 mg | Freq: Four times a day (QID) | INTRAMUSCULAR | Status: AC
  Administered 2019-12-22 (×3): 30 mg via INTRAVENOUS
  Filled 2019-12-21 (×3): qty 1

## 2019-12-21 MED ORDER — DIBUCAINE (PERIANAL) 1 % EX OINT: 1.0000 "application " | TOPICAL_OINTMENT | CUTANEOUS | Status: DC | PRN

## 2019-12-21 MED ORDER — ONDANSETRON HCL 4 MG/2ML IJ SOLN
4.0000 mg | Freq: Three times a day (TID) | INTRAMUSCULAR | Status: DC | PRN
  Administered 2019-12-21: 4 mg via INTRAVENOUS
  Filled 2019-12-21: qty 2

## 2019-12-21 MED ORDER — CEFAZOLIN SODIUM-DEXTROSE 2-4 GM/100ML-% IV SOLN
INTRAVENOUS | Status: AC
  Filled 2019-12-21: qty 100

## 2019-12-21 MED ORDER — SIMETHICONE 80 MG PO CHEW
80.0000 mg | CHEWABLE_TABLET | ORAL | Status: DC
  Administered 2019-12-21 – 2019-12-23 (×3): 80 mg via ORAL
  Filled 2019-12-21 (×3): qty 1

## 2019-12-21 MED ORDER — KETOROLAC TROMETHAMINE 30 MG/ML IJ SOLN
INTRAMUSCULAR | Status: AC
  Filled 2019-12-21: qty 1

## 2019-12-21 MED ORDER — SODIUM CHLORIDE 0.9 % IV SOLN: INTRAVENOUS | Status: DC | PRN

## 2019-12-21 SURGICAL SUPPLY — 40 items
BENZOIN TINCTURE PRP APPL 2/3 (GAUZE/BANDAGES/DRESSINGS) ×3 IMPLANT
CHLORAPREP W/TINT 26ML (MISCELLANEOUS) ×3 IMPLANT
CLAMP CORD UMBIL (MISCELLANEOUS) IMPLANT
CLOSURE WOUND 1/2 X4 (GAUZE/BANDAGES/DRESSINGS) ×1
CLOTH BEACON ORANGE TIMEOUT ST (SAFETY) ×3 IMPLANT
DRAPE C SECTION CLR SCREEN (DRAPES) IMPLANT
DRSG OPSITE POSTOP 4X10 (GAUZE/BANDAGES/DRESSINGS) ×3 IMPLANT
ELECT REM PT RETURN 9FT ADLT (ELECTROSURGICAL) ×3
ELECTRODE REM PT RTRN 9FT ADLT (ELECTROSURGICAL) ×1 IMPLANT
EXTRACTOR VACUUM M CUP 4 TUBE (SUCTIONS) IMPLANT
EXTRACTOR VACUUM M CUP 4' TUBE (SUCTIONS)
GLOVE BIO SURGEON STRL SZ7.5 (GLOVE) ×3 IMPLANT
GLOVE BIOGEL PI IND STRL 7.0 (GLOVE) ×1 IMPLANT
GLOVE BIOGEL PI INDICATOR 7.0 (GLOVE) ×2
GOWN STRL REUS W/TWL 2XL LVL3 (GOWN DISPOSABLE) ×3 IMPLANT
GOWN STRL REUS W/TWL LRG LVL3 (GOWN DISPOSABLE) ×6 IMPLANT
KIT ABG SYR 3ML LUER SLIP (SYRINGE) IMPLANT
NEEDLE HYPO 22GX1.5 SAFETY (NEEDLE) ×3 IMPLANT
NEEDLE HYPO 25X5/8 SAFETYGLIDE (NEEDLE) IMPLANT
NS IRRIG 1000ML POUR BTL (IV SOLUTION) ×3 IMPLANT
PACK C SECTION WH (CUSTOM PROCEDURE TRAY) ×3 IMPLANT
PAD OB MATERNITY 4.3X12.25 (PERSONAL CARE ITEMS) ×3 IMPLANT
PENCIL SMOKE EVAC W/HOLSTER (ELECTROSURGICAL) ×3 IMPLANT
RTRCTR C-SECT PINK 25CM LRG (MISCELLANEOUS) ×3 IMPLANT
STRIP CLOSURE SKIN 1/2X4 (GAUZE/BANDAGES/DRESSINGS) ×2 IMPLANT
SUT CHROMIC 1 CTX 36 (SUTURE) ×6 IMPLANT
SUT PLAIN 0 NONE (SUTURE) ×3 IMPLANT
SUT VIC AB 1 CT1 36 (SUTURE) ×6 IMPLANT
SUT VIC AB 2-0 CT1 (SUTURE) ×3 IMPLANT
SUT VIC AB 2-0 CT1 27 (SUTURE) ×2
SUT VIC AB 2-0 CT1 TAPERPNT 27 (SUTURE) ×1 IMPLANT
SUT VIC AB 3-0 CT1 27 (SUTURE) ×4
SUT VIC AB 3-0 CT1 TAPERPNT 27 (SUTURE) ×2 IMPLANT
SUT VIC AB 3-0 SH 27 (SUTURE)
SUT VIC AB 3-0 SH 27X BRD (SUTURE) IMPLANT
SUT VIC AB 4-0 KS 27 (SUTURE) ×3 IMPLANT
SYR BULB IRRIGATION 50ML (SYRINGE) IMPLANT
TOWEL OR 17X24 6PK STRL BLUE (TOWEL DISPOSABLE) ×3 IMPLANT
TRAY FOLEY W/BAG SLVR 14FR LF (SET/KITS/TRAYS/PACK) ×3 IMPLANT
WATER STERILE IRR 1000ML POUR (IV SOLUTION) ×3 IMPLANT

## 2019-12-21 NOTE — Anesthesia Preprocedure Evaluation (Addendum)
Anesthesia Evaluation  Patient identified by MRN, date of birth, ID band Patient awake  General Assessment Comment:NPO since 10am   Reviewed: Allergy & Precautions, NPO status , Patient's Chart, lab work & pertinent test results  History of Anesthesia Complications (+) POST - OP SPINAL HEADACHE and history of anesthetic complications (has never needed blood patch, has recurrent headaches)  Airway Mallampati: I  TM Distance: >3 FB Neck ROM: Full    Dental no notable dental hx. (+) Teeth Intact, Dental Advisory Given   Pulmonary former smoker,    Pulmonary exam normal breath sounds clear to auscultation       Cardiovascular negative cardio ROS Normal cardiovascular exam Rhythm:Regular Rate:Normal     Neuro/Psych  Headaches, PSYCHIATRIC DISORDERS Anxiety Depression Hx hydrocephalus, has had 10 spinal taps in the past per patient- last one was 10 years ago    GI/Hepatic negative GI ROS, Neg liver ROS,   Endo/Other  Obesity BMI 36  Renal/GU negative Renal ROS  negative genitourinary   Musculoskeletal negative musculoskeletal ROS (+)   Abdominal Normal abdominal exam  (+)   Peds  Hematology  (+) Blood dyscrasia, anemia , hct 33.3, plt 213   Anesthesia Other Findings   Reproductive/Obstetrics (+) Pregnancy G7P3 at 38 4/7 2 prior c sections 2017, 2020 Seen in office today, diagnosed with preE SF (HA) but all preE labs today are negative Desires sterility                           Anesthesia Physical Anesthesia Plan  ASA: III  Anesthesia Plan: Spinal   Post-op Pain Management:    Induction:   PONV Risk Score and Plan: Ondansetron, Dexamethasone and Treatment may vary due to age or medical condition  Airway Management Planned: Natural Airway  Additional Equipment: None  Intra-op Plan:   Post-operative Plan:   Informed Consent: I have reviewed the patients History and Physical,  chart, labs and discussed the procedure including the risks, benefits and alternatives for the proposed anesthesia with the patient or authorized representative who has indicated his/her understanding and acceptance.       Plan Discussed with: CRNA  Anesthesia Plan Comments:        Anesthesia Quick Evaluation

## 2019-12-21 NOTE — Telephone Encounter (Signed)
Laura [name] called and she still has a severe headache.  Sh has 2 elevated BPs in Baby Rx which gives her the diagnosis of gestational HTN.  I discussed case with Dr. Alysia Penna who concurs that she should be delivered.  She is for rpt c/s and tubal.  Pt has been NPO since about 10 am this morning.  She will drop off her son and head to MAU.  NPO reviewed again.

## 2019-12-21 NOTE — MAU Note (Signed)
Pt reports to MAU explaining she had a random onset headache, nausea and blurry vision with floaters and she checked her blood pressure: 120/91 and logged it into baby scripts she then received a phone call and was advised to come in.  Pt denies vaginal bleeding, regular contractions, or leaking of fluid.  +FM

## 2019-12-21 NOTE — Consult Note (Addendum)
Neonatology Note:   Attendance at C-section:    I was asked by Dr. Ervin to attend this repeat C/S at term complicated by pre-eclampsia. The mother is a [id number], GBS neg with good prenatal care and known Sjugern (+ANA). AROM at delivery, fluid clear. Infant vigorous with good spontaneous cry and tone. +60sec DCC. Brought to warmer, dried and stimulated.  Received bulb suctioning.  Central cyanosis persisted beyond 3-4 min of life.  Sao2 placed and noted to be persistently in 70s.  BBO2 applied with good response; fio2 gradually weaned to 21% then RA over 5min period.  Ap 8/8. Lungs clearing to ausc in DR. Family updated.  To CN to care of Pediatrician.  Niki Cosman C. Eastyn Skalla, MD    Updated:  Called back to PACU 10min later for significant upper aiway congestion and low sats.  On arrival, audible upper airway congestion.  Sao2 upper 80s-low 90s on RA.   Baby deep suctioned through both nares a small amount of thick mucous.  Sao2 in upper 90s.  Comfortable breathing.  Pink.  Equal and mostly clear lungs.  HR ~140.  Parents updated again.  Continue routine MBU care.   Feel free to call again if necessary.   Janaisha Tolsma C. Niaya Hickok, MD Neonatologist 12/21/2019, 9:18 PM 

## 2019-12-21 NOTE — MAU Note (Signed)
Discharge instructions given to patient, pre-e s/s, return for unresolved h/a, visual changes, RUQ pain. Labor s/s also reviewed with patient. Patient verbalizes understanding.

## 2019-12-21 NOTE — Anesthesia Procedure Notes (Signed)
Spinal  Patient location during procedure: OR Start time: 12/21/2019 7:10 PM End time: 12/21/2019 7:15 PM Staffing Performed: anesthesiologist  Anesthesiologist: Elmer Picker, MD Preanesthetic Checklist Completed: patient identified, IV checked, risks and benefits discussed, surgical consent, monitors and equipment checked, pre-op evaluation and timeout performed Spinal Block Patient position: sitting Prep: DuraPrep and site prepped and draped Patient monitoring: cardiac monitor, continuous pulse ox and blood pressure Approach: midline Location: L3-4 Injection technique: single-shot Needle Needle type: Pencan  Needle gauge: 24 G Needle length: 9 cm Assessment Sensory level: T6 Additional Notes Functioning IV was confirmed and monitors were applied. Sterile prep and drape, including hand hygiene and sterile gloves were used. The patient was positioned and the spine was prepped. The skin was anesthetized with lidocaine.  Free flow of clear CSF was obtained prior to injecting local anesthetic into the CSF.  The spinal needle aspirated freely following injection.  The needle was carefully withdrawn.  The patient tolerated the procedure well.

## 2019-12-21 NOTE — Op Note (Signed)
Cesarean Section Procedure Note  12/21/2019  8:15 PM  PATIENT:  Laura [name]  24 y.o. female  PRE-OPERATIVE DIAGNOSIS:  previous cesarean and bilateral salpingectomy  POST-OPERATIVE DIAGNOSIS:  previous cesarean and bilateral salpingectomy  PROCEDURE:  Procedure(s): CESAREAN SECTION WITH BILATERAL TUBAL LIGATION (N/A)  SURGEON:  Surgeon(s) and Role:    * Hermina Staggers, MD - Primary    * Alric Seton, MD - Fellow  ANESTHESIA:   spinal  EBL: Intake: 800 mL lactated ringers Urine output:  BLOOD ADMINISTERED:none  DRAINS: none   LOCAL MEDICATIONS USED:  NONE  SPECIMEN: left and right fallopian tubes  DISPOSITION OF SPECIMEN:  PATHOLOGY   Procedure Details   The patient was seen in the Holding Room. The risks, benefits, complications, treatment options, and expected outcomes were discussed with the patient.  The patient concurred with the proposed plan, giving informed consent.  The site of surgery properly noted/marked. The patient was taken to Operating Room, identified as Laura [name] and the procedure verified as C-Section Delivery. A Time Out was held and the above information confirmed.  After induction of anesthesia, the patient was draped and prepped in the usual sterile manner. A Pfannenstiel incision was made and carried down through the subcutaneous tissue to the fascia. Fascial incision was made and extended transversely with mayo scissors. The fascia was separated from the underlying rectus tissue superiorly and inferiorly both sharply and bluntly. The peritoneum was identified and entered. Peritoneal incision was extended longitudinally. Alexis retractor was inserted. A low transverse uterine incision was made. Delivered from cephalic presentation was a Female with Apgar scores of 8 at one minute and 8 at five minutes. After the umbilical cord was clamped and cut cord blood was obtained for evaluation. The placenta was removed intact  and appeared normal. The uterine outline, tubes and ovaries appeared normal. The uterine incision was closed with running locked sutures of Chromic. Hemostasis was observed.   [place]: The right Fallopian tube was identified by tracing out to the fimbraie, grasped with the Babcock clamps. An avascular midsection of the tube approximately 3-4cm from the cornua was grasped with the babcock clamps and the distal and proximal aspects were ligated with a suture of chromic gut, with the intervening portion of tube was transected and removed, via the Metzenbaum scissors. Another suture tie was then placed below both stumps.  Attention was then turned to the left fallopian tube after confirmation by tracing the tube out to the fimbriae. The same procedure was then performed on the left Fallopian tube, with excellent hemostasis was noted from both BTL sites.  The fascia and muscles were then reapproximated with running sutures of Vicryl. The skin was reapproximated with Vicryl.  Instrument, sponge, and needle counts were correct prior the abdominal closure and at the conclusion of the case.   Complications:  None; patient tolerated the procedure well.  COUNTS:  YES  PLAN OF CARE: Admit to inpatient , OB specialty care  PATIENT DISPOSITION:  PACU - hemodynamically stable.   Delay start of Pharmacological VTE agent (>24hrs) due to surgical blood loss or risk of bleeding: no             Disposition: PACU - hemodynamically stable.         Condition: stable   Alric Seton, MD 12/21/2019 8:15 PM

## 2019-12-21 NOTE — MAU Provider Note (Signed)
History     CSN: [id number]  Arrival date and time: 12/20/19 2351   First Provider Initiated Contact with Patient 12/21/19 0049      Chief Complaint  Patient presents with   Headache   HPI  Laura [name] is a 24 y.o. [id number] at [redacted]w[redacted]d who presents with a headache and elevated BP at home. She states she had a sudden onset of a headache tonight so she checked her BP which was 120s/90s. She denies any visual changes or epigastric pain. She denies any problems with HTN before today. She denies any pain. Denies leaking or bleeding and reports normal fetal movement.   OB History    Gravida  7   Para  3   Term  3   Preterm  0   AB  3   Living  3     SAB  3   TAB  0   Ectopic  0   Multiple  0   Live Births  3           Past Medical History:  Diagnosis Date   Anxiety    Depression    since 12/18 from [facility], PPD   Hydrocephalus (HCC)    Meningitis    PTSD (post-traumatic stress disorder) 2019   Sjogren's syndrome (HCC)    Spinal headache     Past Surgical History:  Procedure Laterality Date   APPENDECTOMY     CESAREAN SECTION N/A 12/30/2015   Procedure: CESAREAN SECTION;  Surgeon: Adam Phenix, MD;  Location: [facility] BIRTHING SUITES;  Service: Obstetrics;  Laterality: N/A;   CESAREAN SECTION N/A 04/05/2018   Procedure: CESAREAN SECTION;  Surgeon: Catalina Antigua, MD;  Location: [facility] BIRTHING SUITES;  Service: Obstetrics;  Laterality: N/A;   CHOLECYSTECTOMY     spnal tap     URETHRA SURGERY      Family History  Problem Relation Age of Onset   Diabetes Maternal Uncle    Diabetes Paternal Aunt    Diabetes Paternal Uncle    Cancer Paternal Grandmother    Diabetes Paternal Sharol Roussel' disease Mother    Lupus Maternal Grandmother     Social History   Tobacco Use   Smoking status: Former Smoker    Quit date: 2018    Years since quitting: 3.7   Smokeless tobacco: Never Used  Building services engineer Use: Former   Substance Use Topics   Alcohol use: No   Drug use: No    Allergies:  Allergies  Allergen Reactions   Divalproex Sodium Other (See Comments)    Reaction:  Memory loss    Demerol [Meperidine] Hives    Medications Prior to Admission  Medication Sig Dispense Refill Last Dose   acetaminophen (TYLENOL) 500 MG tablet Take 1,000 mg by mouth every 6 (six) hours as needed for mild pain or headache.      Blood Pressure Monitoring (BLOOD PRESSURE CUFF) MISC 1 Device by Does not apply route as needed. (Patient not taking: Reported on 11/13/2019) 1 each 0    cyclobenzaprine (FLEXERIL) 10 MG tablet Take 1 tablet (10 mg total) by mouth 3 (three) times daily as needed for muscle spasms. (Patient not taking: Reported on 12/01/2019) 30 tablet 0    hydrOXYzine (ATARAX/VISTARIL) 50 MG tablet Take 1 tablet (50 mg total) by mouth 3 (three) times daily as needed for anxiety. (Patient not taking: Reported on 12/01/2019) 30 tablet 2    Prenatal Vit-Fe Fumarate-FA (PRENATAL MULTIVITAMIN) TABS tablet  Take 1 tablet by mouth daily.       sertraline (ZOLOFT) 50 MG tablet Take 1 tablet (50 mg total) by mouth daily. 30 tablet 4     Review of Systems  Constitutional: Negative.  Negative for fatigue and fever.  HENT: Negative.   Respiratory: Negative.  Negative for shortness of breath.   Cardiovascular: Negative.  Negative for chest pain.  Gastrointestinal: Negative.  Negative for abdominal pain, constipation, diarrhea, nausea and vomiting.  Genitourinary: Negative.  Negative for dysuria.  Neurological: Positive for headaches. Negative for dizziness.   Physical Exam   Blood pressure 109/68, pulse 73, temperature 98.6 F (37 C), temperature source Oral, resp. rate 16, height 5\' 7"  (1.702 m), weight 105.1 kg, last menstrual period 03/26/2019, SpO2 99 %, unknown if currently breastfeeding.  Patient Vitals for the past 24 hrs:  BP Temp Temp src Pulse Resp SpO2 Height Weight  12/21/19 0200 116/66 98.4 F  (36.9 C) Oral 69 16 99 % -- --  12/21/19 0145 113/77 -- -- 76 -- -- -- --  12/21/19 0130 103/67 -- -- 78 -- -- -- --  12/21/19 0115 113/68 -- -- 65 -- -- -- --  12/21/19 0105 -- -- -- -- -- 99 % -- --  12/21/19 0100 116/80 -- -- 73 -- 98 % -- --  12/21/19 0045 111/70 -- -- 77 -- -- -- --  12/21/19 0035 109/68 -- -- 73 16 -- -- --  12/21/19 0034 -- -- -- -- -- 99 % -- --  12/21/19 0010 123/76 98.6 F (37 C) Oral 74 16 99 % 5\' 7"  (1.702 m) 105.1 kg     Physical Exam Vitals and nursing note reviewed.  Constitutional:      General: She is not in acute distress.    Appearance: She is well-developed.  HENT:     Head: Normocephalic.  Eyes:     Pupils: Pupils are equal, round, and reactive to light.  Cardiovascular:     Rate and Rhythm: Normal rate and regular rhythm.     Heart sounds: Normal heart sounds.  Pulmonary:     Effort: Pulmonary effort is normal. No respiratory distress.     Breath sounds: Normal breath sounds.  Abdominal:     General: Bowel sounds are normal. There is no distension.     Palpations: Abdomen is soft.     Tenderness: There is no abdominal tenderness.  Skin:    General: Skin is warm and dry.  Neurological:     Mental Status: She is alert and oriented to person, place, and time.     Motor: No abnormal muscle tone.     Coordination: Coordination normal.     Deep Tendon Reflexes: Reflexes are normal and symmetric. Reflexes normal.  Psychiatric:        Behavior: Behavior normal.        Thought Content: Thought content normal.        Judgment: Judgment normal.    Fetal Tracing:  Baseline: 120 Variability: moderate Accels: 15x15 Decels: none  Toco: occasional uc's  MAU Course  Procedures Results for orders placed or performed during the hospital encounter of 12/20/19 (from the past 24 hour(s))  Protein / creatinine ratio, urine     Status: None   Collection Time: 12/21/19 12:30 AM  Result Value Ref Range   Creatinine, Urine 41.42 mg/dL   Total  Protein, Urine <6 mg/dL   Protein Creatinine Ratio        0.00 - 0.15  mg/mg[Cre]  CBC     Status: Abnormal   Collection Time: 12/21/19  1:05 AM  Result Value Ref Range   WBC 7.5 4.0 - 10.5 K/uL   RBC 3.79 (L) 3.87 - 5.11 MIL/uL   Hemoglobin 11.1 (L) 12.0 - 15.0 g/dL   HCT 40.9 (L) 36 - 46 %   MCV 87.9 80.0 - 100.0 fL   MCH 29.3 26.0 - 34.0 pg   MCHC 33.3 30.0 - 36.0 g/dL   RDW 81.1 91.4 - 78.2 %   Platelets 213 150 - 400 K/uL   nRBC 0.0 0.0 - 0.2 %  Comprehensive metabolic panel     Status: Abnormal   Collection Time: 12/21/19  1:05 AM  Result Value Ref Range   Sodium 136 135 - 145 mmol/L   Potassium 3.6 3.5 - 5.1 mmol/L   Chloride 107 98 - 111 mmol/L   CO2 18 (L) 22 - 32 mmol/L   Glucose, Bld 92 70 - 99 mg/dL   BUN <5 (L) 6 - 20 mg/dL   Creatinine, Ser 9.56 0.44 - 1.00 mg/dL   Calcium 9.1 8.9 - 21.3 mg/dL   Total Protein 6.5 6.5 - 8.1 g/dL   Albumin 3.0 (L) 3.5 - 5.0 g/dL   AST 15 15 - 41 U/L   ALT 11 0 - 44 U/L   Alkaline Phosphatase 75 38 - 126 U/L   Total Bilirubin 0.7 0.3 - 1.2 mg/dL   GFR calc non Af Amer >60 >60 mL/min   GFR calc Af Amer >60 >60 mL/min   Anion gap 11 5 - 15   MDM UA CBC, CMP, Protein/creat ratio Tylenol 1000mg  PO- patient reports resolution of HA Patient normotensive throughout MAU visit  Assessment and Plan   1. Pregnancy headache in third trimester   2. [redacted] weeks gestation of pregnancy    -Discharge home in stable condition -Preeclampsia precautions discussed -Patient advised to follow-up with OB on 10/4 as scheduled  -Patient may return to MAU as needed or if her condition were to change or worsen    12/4 CNM 12/21/2019, 12:50 AM

## 2019-12-21 NOTE — Progress Notes (Signed)
Trace-Protein Neg-Glucose    PRENATAL VISIT NOTE  Subjective:  Laura [name] is a 24 y.o. [id number] at [redacted]w[redacted]d being seen today for ongoing prenatal care.  She is currently monitored for the following issues for this low-risk pregnancy and has Recurrent pregnancy loss; History of postpartum depression, currently pregnant; History of cesarean delivery, antepartum; Supervision of other normal pregnancy, antepartum; ANA positive; Psoriasis; and Sjogren's syndrome (HCC) on their problem list.  Patient reports headache (see assessment and plan)  Contractions: Irregular. Vag. Bleeding: None.  Movement: Present. Denies leaking of fluid.   The following portions of the patient's history were reviewed and updated as appropriate: allergies, current medications, past family history, past medical history, past social history, past surgical history and problem list.   Objective:   Vitals:   12/21/19 1047  BP: 116/70  Pulse: 78  Weight: 231 lb (104.8 kg)    Fetal Status: Fetal Heart Rate (bpm): 137   Movement: Present     General:  Alert, oriented and cooperative. Patient is in no acute distress.  Skin: Skin is warm and dry. No rash noted.   Cardiovascular: Normal heart rate noted  Respiratory: Normal respiratory effort, no problems with respiration noted  Abdomen: Soft, gravid, appropriate for gestational age.  Pain/Pressure: Present     Pelvic: Cervical exam deferred        Extremities: Normal range of motion.  Edema: Trace  Mental Status: Normal mood and affect. Normal behavior. Normal judgment and thought content.   Assessment and Plan:  Pregnancy: [id number] at [redacted]w[redacted]d 1. [redacted] weeks gestation of pregnancy Bp normal today in office.  Was high at midnight (120/91).  Went to MAU with normal labs and BPs.  Pt still has headache.  It is one sided with nausea.  Pt advised to eat and take one dose of ibuprofen.  Take BP hourly.  If headache persists or has more elevated pressures will proceed with  delivery.  Pt to call our office at 3 pm to discuss.   Term labor symptoms and general obstetric precautions including but not limited to vaginal bleeding, contractions, leaking of fluid and fetal movement were reviewed in detail with the patient. Please refer to After Visit Summary for other counseling recommendations.   No follow-ups on file.  Future Appointments  Date Time Provider Department Center  12/24/2019  8:10 AM [facility] 1 [facility] None  01/11/2020  9:45 AM Lesly Dukes, MD [facility]  02/01/2020  9:00 AM Lesly Dukes, MD [facility]    Elsie Lincoln, MD

## 2019-12-21 NOTE — Discharge Instructions (Signed)

## 2019-12-21 NOTE — H&P (Signed)
OBSTETRIC ADMISSION HISTORY AND PHYSICAL  Laura [name] is a 24 y.o. female [phone] with IUP at [redacted]w[redacted]d by LMP c/w 6wk u/s presenting for rLTCS due to preE w/SF (headache, vision changes). She had an elevated BP of 120/91 which triggered Babyscripts last night and she reported to the MAU. She was also having vision changes at that time and states her headache initially improved but then worsened. She presents from clinic today after having continued headache and vision changes, not relieved with tylenol. She reports +FMs, No LOF, no VB, no blurry vision, headaches or peripheral edema, and RUQ pain.  She plans on breast feeding. She request BTL (papers signed 09/14/19) for birth control. She received her prenatal care at [facility]   Dating: By LMP c/w 6 wk u/s --->  Estimated Date of Delivery: 12/31/19  Sono:    10/13/19@[redacted]w[redacted]d , CWD, normal anatomy, cephalic presentation, 1233g, 64% EFW   Prenatal History/Complications:  Sjogrens syndrome (diagnosed this pregnancy, not on meds) History of prior cesarean section x2 (initial was breech with failed version, then repeat) History of postpartum depression (stable on zoloft) Desire for permanent sterilization (papers signed 09/14/19)  Past Medical History: Past Medical History:  Diagnosis Date  . Anxiety   . Depression    since 12/18 from [facility], PPD  . Hydrocephalus (HCC)   . Meningitis   . PTSD (post-traumatic stress disorder) 2019  . Sjogren's syndrome (HCC)   . Spinal headache     Past Surgical History: Past Surgical History:  Procedure Laterality Date  . APPENDECTOMY    . CESAREAN SECTION N/A 12/30/2015   Procedure: CESAREAN SECTION;  Surgeon: Adam Phenix, MD;  Location: [facility] BIRTHING SUITES;  Service: Obstetrics;  Laterality: N/A;  . CESAREAN SECTION N/A 04/05/2018   Procedure: CESAREAN SECTION;  Surgeon: Catalina Antigua, MD;  Location: [facility] BIRTHING SUITES;  Service: Obstetrics;  Laterality: N/A;  . CHOLECYSTECTOMY    . spnal tap     . URETHRA SURGERY      Obstetrical History: OB History    Gravida  7   Para  3   Term  3   Preterm  0   AB  3   Living  3     SAB  3   TAB  0   Ectopic  0   Multiple  0   Live Births  3           Social History Social History   Socioeconomic History  . Marital status: Single    Spouse name: Not on file  . Number of children: Not on file  . Years of education: Not on file  . Highest education level: Not on file  Occupational History  . Not on file  Tobacco Use  . Smoking status: Former Smoker    Quit date: 2018    Years since quitting: 3.7  . Smokeless tobacco: Never Used  Vaping Use  . Vaping Use: Former  Substance and Sexual Activity  . Alcohol use: No  . Drug use: No  . Sexual activity: Yes    Birth control/protection: None  Other Topics Concern  . Not on file  Social History Narrative  . Not on file   Social Determinants of Health   Financial Resource Strain:   . Difficulty of Paying Living Expenses: Not on file  Food Insecurity:   . Worried About Programme researcher, broadcasting/film/video in the Last Year: Not on file  . Ran Out of Food in the Last Year: Not on  file  Transportation Needs:   . Freight forwarderLack of Transportation (Medical): Not on file  . Lack of Transportation (Non-Medical): Not on file  Physical Activity:   . Days of Exercise per Week: Not on file  . Minutes of Exercise per Session: Not on file  Stress:   . Feeling of Stress : Not on file  Social Connections:   . Frequency of Communication with Friends and Family: Not on file  . Frequency of Social Gatherings with Friends and Family: Not on file  . Attends Religious Services: Not on file  . Active Member of Clubs or Organizations: Not on file  . Attends BankerClub or Organization Meetings: Not on file  . Marital Status: Not on file    Family History: Family History  Problem Relation Age of Onset  . Diabetes Maternal Uncle   . Diabetes Paternal Aunt   . Diabetes Paternal Uncle   . Cancer  Paternal Grandmother   . Diabetes Paternal Grandmother   . Graves' disease Mother   . Lupus Maternal Grandmother     Allergies: Allergies  Allergen Reactions  . Divalproex Sodium Other (See Comments)    Reaction:  Memory loss   . Demerol [Meperidine] Hives    Medications Prior to Admission  Medication Sig Dispense Refill Last Dose  . acetaminophen (TYLENOL) 500 MG tablet Take 1,000 mg by mouth every 6 (six) hours as needed for mild pain or headache. (Patient not taking: Reported on 12/21/2019)     . Blood Pressure Monitoring (BLOOD PRESSURE CUFF) MISC 1 Device by Does not apply route as needed. (Patient not taking: Reported on 11/13/2019) 1 each 0   . cyclobenzaprine (FLEXERIL) 10 MG tablet Take 1 tablet (10 mg total) by mouth 3 (three) times daily as needed for muscle spasms. (Patient not taking: Reported on 12/01/2019) 30 tablet 0   . hydrOXYzine (ATARAX/VISTARIL) 50 MG tablet Take 1 tablet (50 mg total) by mouth 3 (three) times daily as needed for anxiety. (Patient not taking: Reported on 12/01/2019) 30 tablet 2   . Prenatal Vit-Fe Fumarate-FA (PRENATAL MULTIVITAMIN) TABS tablet Take 1 tablet by mouth daily.      . sertraline (ZOLOFT) 50 MG tablet Take 1 tablet (50 mg total) by mouth daily. 30 tablet 4      Review of Systems   All systems reviewed and negative except as stated in HPI  Last menstrual period 03/26/2019, unknown if currently breastfeeding. General appearance: alert, cooperative and no distress Lungs: normal respiratory effort Heart: regular rate and rhythm Abdomen: soft, non-tender; gravid Pelvic: not indicated Extremities: Homans sign is negative, no sign of DVT Presentation: cephalic on last ultrasound, no cervical exam performed     Prenatal labs: ABO, Rh: A/RH(D) POSITIVE/-- (03/22 0934) Antibody: NO ANTIBODIES DETECTED (03/22 0934) Rubella: 3.48 (03/22 0934) RPR: NON-REACTIVE (08/02 0934)  HBsAg: NON-REACTIVE (03/22 0934)  HIV: NON-REACTIVE (08/02 0934)   GBS:   neg 2 hr Glucola passed Genetic screening low risk Anatomy US normal  Prenatal Transfer Tool  Maternal Diabetes: No Genetic Screening: Normal Maternal Ultrasounds/Referrals: Normal Fetal Ultrasounds or other Referrals:  None Maternal Substance Abuse:  No Significant Maternal Medications:  None Significant Maternal Lab Results: None  Results for orders placed or performed during the hospital encounter of 12/20/19 (from the past 24 hour(s))  Protein / creatinine ratio, urine   Collection Time: 12/21/19 12:30 AM  Result Value Ref Range   Creatinine, Urine 41.42 mg/dL   Total Protein, Urine <6 mg/dL   Protein Creatinine Ratio  0.00 - 0.15 mg/mg[Cre]  CBC   Collection Time: 12/21/19  1:05 AM  Result Value Ref Range   WBC 7.5 4.0 - 10.5 K/uL   RBC 3.79 (L) 3.87 - 5.11 MIL/uL   Hemoglobin 11.1 (L) 12.0 - 15.0 g/dL   HCT 77.9 (L) 36 - 46 %   MCV 87.9 80.0 - 100.0 fL   MCH 29.3 26.0 - 34.0 pg   MCHC 33.3 30.0 - 36.0 g/dL   RDW 39.0 30.0 - 92.3 %   Platelets 213 150 - 400 K/uL   nRBC 0.0 0.0 - 0.2 %  Comprehensive metabolic panel   Collection Time: 12/21/19  1:05 AM  Result Value Ref Range   Sodium 136 135 - 145 mmol/L   Potassium 3.6 3.5 - 5.1 mmol/L   Chloride 107 98 - 111 mmol/L   CO2 18 (L) 22 - 32 mmol/L   Glucose, Bld 92 70 - 99 mg/dL   BUN <5 (L) 6 - 20 mg/dL   Creatinine, Ser 3.00 0.44 - 1.00 mg/dL   Calcium 9.1 8.9 - 76.2 mg/dL   Total Protein 6.5 6.5 - 8.1 g/dL   Albumin 3.0 (L) 3.5 - 5.0 g/dL   AST 15 15 - 41 U/L   ALT 11 0 - 44 U/L   Alkaline Phosphatase 75 38 - 126 U/L   Total Bilirubin 0.7 0.3 - 1.2 mg/dL   GFR calc non Af Amer >60 >60 mL/min   GFR calc Af Amer >60 >60 mL/min   Anion gap 11 5 - 15    Patient Active Problem List   Diagnosis Date Noted  . Sjogren's syndrome (HCC) 08/13/2019  . Psoriasis 07/15/2019  . ANA positive 06/10/2019  . Supervision of other normal pregnancy, antepartum 06/05/2019  . History of postpartum  depression, currently pregnant 10/28/2017  . History of cesarean delivery, antepartum 10/28/2017  . 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 [id number] at [redacted]w[redacted]d here for rLTCS in the setting of preE with severe features.  #Cesarean section  The risks of cesarean section were discussed with the patient including but were not limited to: bleeding which may require transfusion or reoperation; infection which may require antibiotics; injury to bowel, bladder, ureters or other surrounding organs; injury to the fetus; need for additional procedures including hysterectomy in the event of a life-threatening hemorrhage; placental abnormalities wth subsequent pregnancies, incisional problems, thromboembolic phenomenon and other postoperative/anesthesia complications.  Patient also desires permanent sterilization.  Other reversible forms of contraception were discussed with patient; she declines all other modalities. Risks of procedure discussed with patient including but not limited to: risk of regret, permanence of method, bleeding, infection, injury to surrounding organs and need for additional procedures.  Failure risk of about 1% with increased risk of ectopic gestation if pregnancy occurs was also discussed with patient.  Also discussed possibility of post-tubal pain syndrome. The patient concurred with the proposed plan, giving informed written consent for the procedures.  Patient has been NPO since 10am she will remain NPO for procedure. Anesthesia and OR aware.  Preoperative prophylactic antibiotics and SCDs ordered on call to the OR.  To OR when ready.  #Pain: spinal #ID: GBS neg, intraoperative ancef #MOF: breast #MOC: BTL, papers signed #Circ: n/a #preE with SF: no prior diagnosis of htn this pregnancy, patient with elevated home BP readings and now a headache and vision changes. PreE labs unremarkable. Initiate Mg. Anti-hypertensive protocol. Continue  to monitor.  Broadus John  Germaine Pomfret, MD  12/21/2019, 4:52 PM

## 2019-12-21 NOTE — Discharge Instructions (Signed)

## 2019-12-21 NOTE — Discharge Summary (Signed)
Postpartum Discharge Summary  Date of Service updated 12/24/19     Patient Name: Laura [name] DOB: November 30, 1995 MRN: [id number]  Date of admission: 12/21/2019 Delivery date:12/21/2019  Delivering provider: Chancy Milroy  Date of discharge: 12/24/2019  Admitting diagnosis: Pregnancy [Z34.90] Intrauterine pregnancy: [redacted]w[redacted]d    Secondary diagnosis:  Active Problems:   Recurrent pregnancy loss   History of postpartum depression, currently pregnant   History of cesarean delivery, antepartum   Supervision of other normal pregnancy, antepartum   ANA positive   [place]'s [place] ([place]   Pregnancy   Cesarean delivery delivered   History of bilateral tubal ligation  Additional problems: none    Discharge diagnosis: Term Pregnancy Delivered                                              Post partum procedures:postpartum tubal ligation Augmentation: N/A Complications: None  Hospital course: Sceduled C/S   24y.o. yo [id number] admitted to the hospital 12/21/2019 for scheduled cesarean section with the following indication:Elective Repeat and pre-eclampsia with severe features. Patient was seen in clinic and due to elevated BP and headaches as well as vision changes, patient was also experiencing headache and vision changes so decision was made to transfer to the hospital for cesarean section, given history of 2 prior cesarean deliveries. Delivery details are as follows:  Membrane Rupture Time/Date:  ,   Delivery Method:C-Section, Low Transverse  Details of operation can be found in separate operative note.  Patient had an uncomplicated postpartum course.  She is ambulating, tolerating a regular diet, passing flatus, and urinating well. Patient is discharged home in stable condition on  12/24/19        Newborn Data: Birth date:12/21/2019  Birth time:7:37 PM  Gender:Female  Living status:Living  Apgars:8 ,8  Weight:3504 g     Magnesium Sulfate received: Yes: Seizure  prophylaxis BMZ received: No Rhophylac:N/A MMR:N/A T-DaP:Given prenatally Flu: No Transfusion:No  Physical exam  Vitals:   12/23/19 0753 12/23/19 1341 12/23/19 2032 12/24/19 0510  BP: 114/69 114/68 101/70 96/67  Pulse: 64 72 68 67  Resp: _0 Temp: 97.9 F (36.6 C) 98.7 F (37.1 C) 98.4 F (36.9 C) 98.5 F (36.9 C)  TempSrc: Oral Oral Oral Oral  SpO2: 100% 99% 98%   Weight:      Height:       General: alert, cooperative and no distress Lochia: appropriate Uterine Fundus: firm Incision: Dressing is clean, dry, and intact DVT Evaluation: No evidence of DVT seen on physical exam. Labs: Lab Results  Component Value Date   WBC 13.3 (H) 12/22/2019   HGB 10.5 (L) 12/22/2019   HCT 32.0 (L) 12/22/2019   MCV 90.4 12/22/2019   PLT 227 12/22/2019   CMP Latest Ref Rng & Units 12/21/2019  Glucose 70 - 99 mg/dL 92  BUN 6 - 20 mg/dL <5(L)  Creatinine 0.44 - 1.00 mg/dL 0.56  Sodium 135 - 145 mmol/L 136  Potassium 3.5 - 5.1 mmol/L 3.6  Chloride 98 - 111 mmol/L 107  CO2 22 - 32 mmol/L 18(L)  Calcium 8.9 - 10.3 mg/dL 9.1  Total Protein 6.5 - 8.1 g/dL 6.5  Total Bilirubin 0.3 - 1.2 mg/dL 0.7  Alkaline Phos 38 - 126 U/L 75  AST 15 - 41 U/L 15  ALT 0 - 44  U/L 11   Edinburgh Score: Edinburgh Postnatal Depression Scale Screening Tool 12/21/2019  I have been able to laugh and see the funny side of things. 0  I have looked forward with enjoyment to things. 0  I have blamed myself unnecessarily when things went wrong. 1  I have been anxious or worried for no good reason. 1  I have felt scared or panicky for no good reason. 1  Things have been getting on top of me. 1  I have been so unhappy that I have had difficulty sleeping. 0  I have felt sad or miserable. 0  I have been so unhappy that I have been crying. 0  The thought of harming myself has occurred to me. 0  Edinburgh Postnatal Depression Scale Total 4     After visit meds:  Allergies as of 12/24/2019       Reactions   Divalproex Sodium Other (See Comments)   Reaction:  Memory loss    Demerol [meperidine] Hives      Medication List    STOP taking these medications   cyclobenzaprine 10 MG tablet Commonly known as: FLEXERIL     TAKE these medications   acetaminophen 500 MG tablet Commonly known as: TYLENOL Take 1,000 mg by mouth every 6 (six) hours as needed for mild pain or headache.   Blood Pressure Cuff Misc 1 Device by Does not apply route as needed.   hydrOXYzine 50 MG tablet Commonly known as: ATARAX/VISTARIL Take 1 tablet (50 mg total) by mouth 3 (three) times daily as needed for anxiety.   ibuprofen 800 MG tablet Commonly known as: ADVIL Take 1 tablet (800 mg total) by mouth every 6 (six) hours.   oxyCODONE 5 MG immediate release tablet Commonly known as: Oxy IR/ROXICODONE Take 1-2 tablets (5-10 mg total) by mouth every 4 (four) hours as needed for moderate pain.   polyethylene glycol 17 g packet Commonly known as: MiraLax Take 17 g by mouth daily.   prenatal multivitamin Tabs tablet Take 1 tablet by mouth daily.   sertraline 50 MG tablet Commonly known as: Zoloft Take 1 tablet (50 mg total) by mouth daily.        Discharge home in stable condition Infant Feeding: Breast Infant Disposition:home with mother Discharge instruction: per After Visit Summary and Postpartum booklet. Activity: Advance as tolerated. Pelvic rest for 6 weeks.  Diet: routine diet Future Appointments: Future Appointments  Date Time Provider Department Center  12/28/2019 11:00 AM Leggett, Kelly H, MD [facility]  01/18/2020  9:45 AM Leggett, Kelly H, MD [facility]   Follow up Visit:   Please schedule this patient for a In person postpartum visit in 4 weeks with the following provider: Any provider. Additional Postpartum F/U:Postpartum Depression checkup, Incision check 1 week and BP check 1 week  High risk pregnancy complicated by: HTN Delivery mode:   C-Section, Low Transverse  Anticipated Birth Control:  BTL done PP   12/24/2019  C , MD   

## 2019-12-21 NOTE — Transfer of Care (Signed)
Immediate Anesthesia Transfer of Care Note  Patient: Laura [name]  Procedure(s) Performed: CESAREAN SECTION WITH BILATERAL TUBAL LIGATION (N/A )  Patient Location: PACU  Anesthesia Type:Spinal  Level of Consciousness: awake, alert  and oriented  Airway & Oxygen Therapy: Patient Spontanous Breathing  Post-op Assessment: Report given to RN and Post -op Vital signs reviewed and stable  Post vital signs: Reviewed and stable  Last Vitals:  Vitals Value Taken Time  BP 112/50 12/21/19 2025  Temp    Pulse 67 12/21/19 2029  Resp 14 12/21/19 2029  SpO2 98 % 12/21/19 2029  Vitals shown include unvalidated device data.  Last Pain:  Vitals:   12/21/19 2025  TempSrc:   PainSc: 0-No pain      Patients Stated Pain Goal: 4 (12/21/19 1715)  Complications: No complications documented.

## 2019-12-21 NOTE — MAU Note (Signed)
Patient had a sudden onset headache rates 4/10 around 2030, checked her BP 120/91 and repeat was 127/84. Patient states that she has spots, denies pain under right breast. Patient received PC after entering into baby scripts and was told to come to MAU to be evaluated.

## 2019-12-22 ENCOUNTER — Encounter (HOSPITAL_COMMUNITY): Payer: Self-pay | Admitting: Obstetrics and Gynecology

## 2019-12-22 DIAGNOSIS — O1415 Severe pre-eclampsia, complicating the puerperium: Secondary | ICD-10-CM | POA: Diagnosis not present

## 2019-12-22 LAB — CBC
HCT: 32 % — ABNORMAL LOW (ref 36.0–46.0)
Hemoglobin: 10.5 g/dL — ABNORMAL LOW (ref 12.0–15.0)
MCH: 29.7 pg (ref 26.0–34.0)
MCHC: 32.8 g/dL (ref 30.0–36.0)
MCV: 90.4 fL (ref 80.0–100.0)
Platelets: 227 10*3/uL (ref 150–400)
RBC: 3.54 MIL/uL — ABNORMAL LOW (ref 3.87–5.11)
RDW: 13.3 % (ref 11.5–15.5)
WBC: 13.3 10*3/uL — ABNORMAL HIGH (ref 4.0–10.5)
nRBC: 0 % (ref 0.0–0.2)

## 2019-12-22 LAB — HIV ANTIBODY (ROUTINE TESTING W REFLEX): HIV Screen 4th Generation wRfx: NONREACTIVE

## 2019-12-22 MED ORDER — SERTRALINE HCL 50 MG PO TABS
50.0000 mg | ORAL_TABLET | Freq: Every day | ORAL | Status: DC
  Administered 2019-12-22 – 2019-12-24 (×3): 50 mg via ORAL
  Filled 2019-12-22 (×3): qty 1

## 2019-12-22 MED ORDER — HYDROXYZINE HCL 25 MG PO TABS
25.0000 mg | ORAL_TABLET | Freq: Four times a day (QID) | ORAL | Status: DC | PRN
  Administered 2019-12-22: 25 mg via ORAL
  Filled 2019-12-22: qty 1

## 2019-12-22 NOTE — Lactation Note (Signed)
This note was copied from a baby's chart. Lactation Consultation Note  Patient Name: Laura [name] AYTKZ'S Date: 12/22/2019 Reason for consult: Follow-up assessment;Early term 37-38.6wks;Infant weight loss  Baby is 22 hours old  OBSC called for mom to request the [facility] assist for latch.  Mom reported to the [facility] the baby attempted to feed at 4:55p.  Baby awake and rooting, LC offered to assist to latch on the left breast  Cross cradle and worked on obtaining the depth and baby fed for 10 mins  With increased swallows. Per mom comfortable until the baby was slowing down and easing off the breast . LC had mom release the suction and she was able to do it well.  Baby was starting to root again so LC offered to show mom the football on the right breast , football and baby latched easier and ate 10 mins , with increased swallows and per mom comfortable.  LC encouraged mom to use her EBM to her nipples liberally.  LC also noted she has areola edema and showed mom how far onto the areola  Baby should latch to prevent further soreness, also reverse pressure.  LC recommended prior to latch breast massage, hand express, reverse pressure and firm support.    Maternal Data Has patient been taught Hand Expression?: Yes  Feeding Feeding Type: Breast Fed  LATCH Score Latch: Grasps breast easily, tongue down, lips flanged, rhythmical sucking.  Audible Swallowing: Spontaneous and intermittent  Type of Nipple: Everted at rest and after stimulation  Comfort (Breast/Nipple): Soft / non-tender  Hold (Positioning): Assistance needed to correctly position infant at breast and maintain latch.  LATCH Score: 9  Interventions Interventions: Breast feeding basics reviewed;Assisted with latch;Skin to skin;Breast massage;Hand express;Reverse pressure;Breast compression;Adjust position;Support pillows;Position options  Lactation Tools Discussed/Used     Consult Status Consult Status:  Follow-up Date: 12/23/19 Follow-up type: In-patient    Matilde Sprang Zeena Starkel 12/22/2019, 6:06 PM

## 2019-12-22 NOTE — Lactation Note (Addendum)
This note was copied from a baby's chart. Lactation Consultation Note  Patient Name: Laura [name] [id number] Date: 12/22/2019 Reason for consult: Initial assessment;Early term 37-38.6wks P4, 7 hour female infant. Mom with hx: post partum depression, anxiety, pre-eclampsia, Sjogren's syndrome and on Zoloft for the depression. Mom is experienced BF mother, per mom, her 1st child only BF for 2 weeks, 2nd child 6 months and 3rd child who is less than one year for 6 months. Mom has DEBP at home and a few hand pumps as well.  Per mom, this is infant's 4 th time latching at the breast, most feedings have been 10 to 15 minutes in length. LC mention using flat pillows and mom started to cry, MGM help calm mom, per mom, she is very tired, lack on sleep, LC said she hears mom concerns and  acknowledge them. LC will alert RN if mom and infant could have a few hours of quiet time without being distrub. Per RN, earlier today mom had panic attack but was calmer after the episode.  Mom was very thankful about being able to have a few hours of rest undistrub and informed LC she had a lot happening, being on Mag, having tubal and lack of sleep since her C/S. Mom's mother is her support person who is assisting  her with infant.  Mom latched infant on her left breast using the cross cradle hold, infant latched with depth, LC did not assist with latch, swallows were heard and infant was still BF after 12 minutes. LC praised mom for good latch and breastfeeding infant.  Mom will continue to BF infant according to hunger cues, 8 to 12+ times, STS. Mom knows to call RN or LC if she has any questions, concerns or would need assistance with latching infant at the breast. Mom made aware of O/P services, breastfeeding support groups, community resources, and our phone # for post-discharge questions.   Maternal Data Formula Feeding for Exclusion: No Has patient been taught Hand Expression?: Yes Does the patient  have breastfeeding experience prior to this delivery?: Yes  Feeding Feeding Type: Breast Fed  LATCH Score Latch: Grasps breast easily, tongue down, lips flanged, rhythmical sucking.  Audible Swallowing: Spontaneous and intermittent  Type of Nipple: Everted at rest and after stimulation  Comfort (Breast/Nipple): Soft / non-tender  Hold (Positioning): No assistance needed to correctly position infant at breast.  LATCH Score: 10  Interventions Interventions: Breast feeding basics reviewed;Skin to skin;Breast compression  Lactation Tools Discussed/Used     Consult Status Consult Status: Follow-up Date: 12/22/19 Follow-up type: In-patient    Danelle Earthly 12/22/2019, 2:39 AM

## 2019-12-22 NOTE — Progress Notes (Signed)
POSTPARTUM PROGRESS NOTE  POD #2  Subjective:  Laura [name] is a 24 y.o. [id number] s/p repeat LTCS at [redacted]w[redacted]d.  She reports she doing well. No acute events overnight.  She denies any problems with ambulating, voiding or po intake. Denies nausea or vomiting. She has not passed flatus. Pain is well controlled.  Lochia is normal.  Pt is still on magnesium sulfate until 2000. Objective: Blood pressure 112/69, pulse 72, temperature 97.6 F (36.4 C), temperature source Axillary, resp. rate 18, height 5\' 7"  (1.702 m), weight 104.8 kg, last menstrual period 03/26/2019, SpO2 98 %, unknown if currently breastfeeding.  Physical Exam:  General: alert, cooperative and no distress Chest: no respiratory distress Heart:regular rate, distal pulses intact Abdomen: soft, nontender,  Uterine Fundus: firm, appropriately tender DVT Evaluation: No calf swelling or tenderness Extremities: minimal edema Skin: warm, dry; incision clean/dry/intact w/ honeycomb dressing in place, small amount of dried blood on right margin of wound  Recent Labs    12/21/19 1720 12/22/19 0501  HGB 11.2* 10.5*  HCT 34.2* 32.0*    Assessment/Plan: Laura [name] is a 24 y.o. 30 s/p repeat cesarean section and bilateral tubal ligation at [redacted]w[redacted]d for preeclampsia with severe features.  POD#1 - Doing well, pain well controlled. H/H appropriate  Routine postpartum care, continue magnesium sulfate until 24 hours is completed  Contraception: tubal ligation Dispo: Plan for discharge possible discharge on POD 2.   LOS: 1 day   [redacted]w[redacted]d, MD, [facility] [employer]teending, [facility] 12/22/2019, 12:53 PM

## 2019-12-22 NOTE — Anesthesia Postprocedure Evaluation (Signed)
Anesthesia Post Note  Patient: Laura [name]  Procedure(s) Performed: CESAREAN SECTION WITH BILATERAL TUBAL LIGATION (N/A )     Patient location during evaluation: PACU Anesthesia Type: Spinal Level of consciousness: oriented and awake and alert Pain management: pain level controlled Vital Signs Assessment: post-procedure vital signs reviewed and stable Respiratory status: spontaneous breathing, respiratory function stable and patient connected to nasal cannula oxygen Cardiovascular status: blood pressure returned to baseline and stable Postop Assessment: no headache, no backache and no apparent nausea or vomiting Anesthetic complications: no   No complications documented.  Last Vitals:  Vitals:   12/22/19 0205 12/22/19 0459  BP:  (!) 107/50  Pulse:  77  Resp:    Temp:    SpO2: 97% 100%    Last Pain:  Vitals:   12/22/19 0200  TempSrc: Oral  PainSc:    Pain Goal: Patients Stated Pain Goal: 4 (12/21/19 2245)                 Aerith Canal L Darald Uzzle

## 2019-12-23 DIAGNOSIS — O1415 Severe pre-eclampsia, complicating the puerperium: Secondary | ICD-10-CM | POA: Diagnosis not present

## 2019-12-23 DIAGNOSIS — Q279 Congenital malformation of peripheral vascular system, unspecified: Secondary | ICD-10-CM | POA: Diagnosis not present

## 2019-12-23 DIAGNOSIS — Q825 Congenital non-neoplastic nevus: Secondary | ICD-10-CM | POA: Diagnosis not present

## 2019-12-23 LAB — SURGICAL PATHOLOGY

## 2019-12-23 LAB — RPR: RPR Ser Ql: NONREACTIVE

## 2019-12-23 NOTE — Progress Notes (Signed)
CSW received consult for hx of PPD, anxiety and depression.  CSW met with MOB to offer support and complete assessment.    When CSW arrived, MOB and FOB were playing cards while infant was asleep in her bassinet. CSW offered to return at a later time and the couple declined. CSW explained CSW's role and MOB gave CSW permission to complete the assessment while FOB was present. MOB acknowledged that FOB was her primary source of support.  FOB engaged with CSW and the couple appeared to be supportive of one another.  They were polite, forthcoming, and easy to engage.   CSW provided education regarding the baby blues period vs. perinatal mood disorders, discussed treatment and gave resources for mental health follow up if concerns arise.  CSW recommends self-evaluation during the postpartum time period using the New Mom Checklist from Postpartum Progress and encouraged MOB to contact a medical professional if symptoms are noted at any time. MOB acknowledged PMAD symptoms after her first child and reported consistent crying and not feeling like she wanted to parent for about 3 months when infant was 3 weeks old. MOB reported feeling comfortable seeking help and utilizing her support team. CSW assessed for safety and MOB denied SI and HI.  CSW identifies no further need for intervention and no barriers to discharge at this time.  Verle Brillhart Boyd-Gilyard, MSW, LCSW Clinical Social Work [phone]  

## 2019-12-23 NOTE — Progress Notes (Signed)
POSTPARTUM PROGRESS NOTE  POD #2  Subjective:  Laura [name] is a 24 y.o. [id number] s/p repeat LTCS at [redacted]w[redacted]d.  She reports she doing well. No acute events overnight.  She denies any problems with ambulating, voiding or po intake. Denies nausea or vomiting. She has  passed flatus. Pain is well controlled.  Lochia is within normal limits.  Objective: Blood pressure 114/69, pulse 64, temperature 97.9 F (36.6 C), temperature source Oral, resp. rate 18, height 5\' 7"  (1.702 m), weight 104.8 kg, last menstrual period 03/26/2019, SpO2 100 %, unknown if currently breastfeeding.  Physical Exam:  General: alert, cooperative and no distress Chest: no respiratory distress Heart:regular rate, distal pulses intact Abdomen: soft, nontender,  Uterine Fundus: firm, appropriately tender DVT Evaluation: No calf swelling or tenderness Extremities:  edema Skin: warm, dry; incision clean/dry/intact w/ honeycomb dressing in place, small amount of dried blood noted without increase in size.  Recent Labs    12/21/19 1720 12/22/19 0501  HGB 11.2* 10.5*  HCT 34.2* 32.0*    Assessment/Plan: Laura [name] is a 24 y.o. 30 s/p repeat LTSC/BTL at [redacted]w[redacted]d for preeclampsia with severe features.[redacted]w[redacted]d  POD#2 - Doing welll; pain well controlled. H/H appropriate  Routine postpartum care  Contraception: tubal ligation Feeding: breast  Dispo: Plan for discharge possible PM 10/6.   LOS: 2 days   Marland Kitchen, MD Faculty Attending, [facility] 12/23/2019, 11:43 AM

## 2019-12-23 NOTE — Lactation Note (Addendum)
This note was copied from a baby's chart. Lactation Consultation Note  Patient Name: Laura [name] [id number] Date: 12/23/2019    Infant is 38 weeks 40 hours old with 8% weight loss. Mom on Zoloft.  Mom experienced with breastfeeding other children 6 months at the breast. With this infant, she states infant falls asleep at the breast even when skin to skin. LC, on arrival, found infant in clothing and Mom in a robe. Mom stated she attempted to nurse infant for 10 minutes and she fell asleep. LC applied heat to both breasts and did breast massage. Mom had compression stripes and healed abrasions from a shallow latch. She is rubbing in expressed breast milk and using coconut oil for nipple care.    Infant needed a diaper change of one stool. Infant now alert and we latched her in football on both sides nursed for 17 minutes with signs of milk transfer. Infant had one moment of clear emesis during the feed. Mom used the bulb suction to remove clear fluid. Infant went back to eating and did well.   Weight loss attributed to multiple stool and urine output.   Plan 1 to feed based on cues 8-12x in 24 hour period no more than 3 hours without an attempt. No pacifier for first 4 weeks.          2. Mom able to pump with 10 ml of EBM. Weight loss attributed to number of stools and urine. Mom will not supplement at this time and will continue to offer breast based on cues.            3. LC will alert the RN, Delorse Lek, about feeding plans and to observe to see if infant continues to have emesis after feeds.

## 2019-12-24 ENCOUNTER — Other Ambulatory Visit (HOSPITAL_COMMUNITY)
Admission: RE | Admit: 2019-12-24 | Discharge: 2019-12-24 | Disposition: A | Discharge status: Home / Self Care | Payer: Medicaid Other | Source: Ambulatory Visit | Attending: Obstetrics & Gynecology | Admitting: Obstetrics & Gynecology

## 2019-12-24 DIAGNOSIS — Z9851 Tubal ligation status: Secondary | ICD-10-CM

## 2019-12-24 MED ORDER — POLYETHYLENE GLYCOL 3350 17 G PO PACK: 17.0000 g | PACK | Freq: Every day | ORAL | 0 refills | Status: DC

## 2019-12-24 MED ORDER — OXYCODONE HCL 5 MG PO TABS: 5.0000 mg | ORAL_TABLET | ORAL | 0 refills | Status: DC | PRN

## 2019-12-24 MED ORDER — IBUPROFEN 800 MG PO TABS: 800.0000 mg | ORAL_TABLET | Freq: Four times a day (QID) | ORAL | 0 refills | Status: AC

## 2019-12-24 NOTE — Lactation Note (Signed)
This note was copied from a baby's chart. Lactation Consultation Note  Patient Name: Laura [name] [id number] Date: 12/24/2019 Reason for consult: Follow-up assessment  P4 mother whose infant is now 95 hours old.  This is an ETI at 38+4 weeks.  Mother has breast feeding experience with all of her other children.    Baby was asleep on father's chest when I arrived.  Family stayed an additional night to work on breast feeding.  When questioned, mother stated that breast feeding has gotten a lot better.  Her daughter is now opening her mouth wide, latching well and is showing feeding cues.  She is still a little bit spitty, however, this has also improved.  Parents are feeling very comfortable with discharge today.  Engorgement prevention/treatment discussed.  Mother has a manual pump and a DEBP for home use.  She has our OP phone number for questions/concerns after discharge.  Mother informed me that father has been very helpful with breast feeding and has been listening when the consultants have been in for their visits.  Praised parents for a job well done.  Baby has a follow up appointment tomorrow.   Maternal Data    Feeding    LATCH Score Latch:  (Was not called to room 11-7 for latch scoring)                 Interventions    Lactation Tools Discussed/Used     Consult Status Consult Status: Complete Date: 12/24/19 Follow-up type: Call as needed    [name] [name] 12/24/2019, 9:18 AM

## 2019-12-26 ENCOUNTER — Inpatient Hospital Stay (HOSPITAL_COMMUNITY): Admit: 2019-12-26 | Payer: Medicaid Other | Admitting: Obstetrics and Gynecology

## 2019-12-26 ENCOUNTER — Encounter (HOSPITAL_COMMUNITY): Payer: Self-pay

## 2019-12-26 SURGERY — Surgical Case
Anesthesia: Regional | Laterality: Bilateral

## 2019-12-28 ENCOUNTER — Encounter: Payer: Self-pay | Admitting: Obstetrics & Gynecology

## 2019-12-28 ENCOUNTER — Other Ambulatory Visit: Payer: Self-pay

## 2019-12-28 ENCOUNTER — Ambulatory Visit (INDEPENDENT_AMBULATORY_CARE_PROVIDER_SITE_OTHER): Payer: Medicaid Other | Admitting: Obstetrics & Gynecology

## 2019-12-28 NOTE — Progress Notes (Signed)
   Subjective:    Patient ID: Laura [name], female    DOB: 1995-04-10, 24 y.o.   MRN: [id number]  HPI  24 yo female presents 1 week pp after induction for severe Pre E due to headache.  Pt's BP today is normal for her and low.  Pt has had one headache since she got home from hospital and BP was normal when she took it.  Breast feeding well.  Edinburgh = 1.  Voiding and nml BM.   Review of Systems  Constitutional: Negative.   Genitourinary: Positive for vaginal bleeding. Negative for pelvic pain.  Neurological: Negative.   Psychiatric/Behavioral: Negative for dysphoric mood.       Objective:   Physical Exam Vitals reviewed.  Constitutional:      General: She is not in acute distress.    Appearance: She is well-developed.  HENT:     Head: Normocephalic and atraumatic.  Eyes:     Conjunctiva/sclera: Conjunctivae normal.  Cardiovascular:     Rate and Rhythm: Normal rate.  Pulmonary:     Effort: Pulmonary effort is normal.  Abdominal:     General: Abdomen is flat. There is no distension.     Palpations: Abdomen is soft.     Tenderness: There is no abdominal tenderness. There is no guarding.     Comments: Incision is c/d/i  Skin:    General: Skin is warm and dry.  Neurological:     Mental Status: She is alert and oriented to person, place, and time.       Assessment & Plan:  1 week post partum Nml BP, so signs or symptoms of PreE Incision healing well No depression. RTC 4-5 weeks.

## 2020-01-11 ENCOUNTER — Ambulatory Visit: Payer: Medicaid Other | Admitting: Obstetrics & Gynecology

## 2020-01-18 ENCOUNTER — Ambulatory Visit (INDEPENDENT_AMBULATORY_CARE_PROVIDER_SITE_OTHER): Payer: Medicaid Other | Admitting: Obstetrics & Gynecology

## 2020-01-18 ENCOUNTER — Other Ambulatory Visit: Payer: Self-pay

## 2020-01-18 ENCOUNTER — Encounter: Payer: Self-pay | Admitting: Obstetrics & Gynecology

## 2020-01-18 ENCOUNTER — Encounter: Payer: Self-pay | Admitting: *Deleted

## 2020-01-18 DIAGNOSIS — Z98891 History of uterine scar from previous surgery: Secondary | ICD-10-CM | POA: Diagnosis not present

## 2020-01-18 DIAGNOSIS — Z9851 Tubal ligation status: Secondary | ICD-10-CM

## 2020-01-18 DIAGNOSIS — G43009 Migraine without aura, not intractable, without status migrainosus: Secondary | ICD-10-CM | POA: Diagnosis not present

## 2020-01-18 DIAGNOSIS — Z23 Encounter for immunization: Secondary | ICD-10-CM

## 2020-01-18 DIAGNOSIS — O99893 Other specified diseases and conditions complicating puerperium: Secondary | ICD-10-CM

## 2020-01-18 MED ORDER — RIZATRIPTAN BENZOATE 10 MG PO TABS: 10.0000 mg | ORAL_TABLET | ORAL | 0 refills | Status: DC | PRN

## 2020-01-18 NOTE — Progress Notes (Signed)
Post Partum Visit Note  Rufus Cypert is a 24 y.o. [phone] female who presents for a postpartum visit. She is 4 weeks postpartum following a repeat cesarean section.  I have fully reviewed the prenatal and intrapartum course. The delivery was at 38 gestational weeks.  Anesthesia: epidural. Postpartum course has been unremarkable. Baby is doing well and thriving. Baby is feeding by breast. Bleeding no bleeding. Bowel function is normal. Bladder function is normal. Patient is not sexually active. Contraception method is bilateral salpingectomy. Postpartum depression screening: negative.  Pt complaining of migraine headaches over right eye.  +photophobia.  Pt has been diagnosed with migraines by her PCP.     The pregnancy intention screening data noted above was reviewed. Potential methods of contraception were discussed. The patient elected to proceed with Female Sterilization.    Edinburgh Postnatal Depression Scale - 01/18/20 0942      Edinburgh Postnatal Depression Scale:  In the Past 7 Days   I have been able to laugh and see the funny side of things. 0    I have looked forward with enjoyment to things. 0    I have blamed myself unnecessarily when things went wrong. 1    I have been anxious or worried for no good reason. 0    I have felt scared or panicky for no good reason. 1    Things have been getting on top of me. 0    I have been so unhappy that I have had difficulty sleeping. 0    I have felt sad or miserable. 0    I have been so unhappy that I have been crying. 0    The thought of harming myself has occurred to me. 0    Edinburgh Postnatal Depression Scale Total 2            The following portions of the patient's history were reviewed and updated as appropriate: allergies, current medications, past family history, past medical history, past social history, past surgical history and problem list.  Review of Systems Pertinent items noted in HPI and remainder of  comprehensive ROS otherwise negative.    Objective:  Blood pressure 99/65, pulse 73, resp. rate 16, height 5\' 7"  (1.702 m), weight 210 lb (95.3 kg), last menstrual period 03/26/2019, currently breastfeeding.  General:  alert, cooperative and no distress   Breasts:  negative  Lungs: normal effort  Heart:  regular rate and rhythm  Abdomen: soft, non-tender; bowel sounds normal; no masses,  no organomegaly   Vulva:  not evaluated  Vagina: not evaluated  Cervix:  not evaluated  Corpus: not examined  Adnexa:  not evaluated  Rectal Exam: Not performed.        Assessment:    Normal postpartum exam.   Plan:   Essential components of care per ACOG recommendations:  1.  Mood and well being: Patient with negative depression screening today. Reviewed local resources for support.  - Patient does not use tobacco.  - hx of drug use? No    2. Infant care and feeding:  -Patient currently breastmilk feeding? Yes   3. Sexuality, contraception and birth spacing - Patient does not want a pregnancy in the next year.  Desired family size is 4 children.  - Reviewed forms of contraception in tiered fashion. Patient desired bilateral tubal ligation today.   - Discussed birth spacing of 18 months  4. Sleep and fatigue -Encouraged family/partner/community support of 4 hrs of uninterrupted sleep to help with mood  and fatigue  5. Physical Recovery  - Discussed patients delivery and pre eclampsia - Patient had a c/s and bilateral salpingetomy - Patient has urinary incontinence? No - Patient is safe to resume physical and sexual activity  6.  Health Maintenance - Last pap smear done 04/2018 and was normal with negative HPV.   7. Chronic Disease - PCP follow up for migraines.  Rx for Maxalt given today.  Elsie Lincoln, MD  [facility] for [employer], [facility]

## 2020-02-01 ENCOUNTER — Ambulatory Visit: Payer: Medicaid Other | Admitting: Obstetrics & Gynecology

## 2020-02-24 ENCOUNTER — Encounter: Payer: Self-pay | Admitting: General Practice

## 2021-10-03 ENCOUNTER — Emergency Department
Admission: EM | Admit: 2021-10-03 | Discharge: 2021-10-03 | Disposition: A | Discharge status: Home / Self Care | Payer: Medicaid Other | Attending: Family Medicine | Admitting: Family Medicine

## 2021-10-03 DIAGNOSIS — M25511 Pain in right shoulder: Secondary | ICD-10-CM

## 2021-10-03 DIAGNOSIS — M778 Other enthesopathies, not elsewhere classified: Secondary | ICD-10-CM | POA: Diagnosis not present

## 2021-10-03 MED ORDER — METHYLPREDNISOLONE 4 MG PO TBPK: ORAL_TABLET | ORAL | 0 refills | Status: AC

## 2021-10-03 MED ORDER — MELOXICAM 15 MG PO TABS: 15.0000 mg | ORAL_TABLET | Freq: Every day | ORAL | 0 refills | Status: AC

## 2021-10-03 NOTE — ED Triage Notes (Signed)
Pt states that she has some right shoulder pain. X3 months.  Pt states that the pain is becoming unbearable over the past 2 days.

## 2021-10-03 NOTE — Discharge Instructions (Signed)
Take the Medrol Dosepak as directed.  Take all of day 1 today (3 now and 3 at bedtime) When you have finished the Dosepak, take meloxicam once a day.  Both of these drugs are anti-inflammatory. Put ice on your shoulder for 20 minutes a couple times a day See sports medicine if you fail to improve in a week or 2

## 2021-10-03 NOTE — ED Provider Notes (Signed)
[facility]    CSN: [id number] Arrival date & time: 10/03/21  1605      History   Chief Complaint Chief Complaint  Patient presents with   Shoulder Pain    Right shoulder pain. X3 months    HPI Laura [name] is a 26 y.o. female.   HPI  Patient has progressively worsening right shoulder pain and limitation over the last month.  It hurts when she tries to swim and do overhead work.  She has not been seen for this previously.  No x-rays.  She has taken some over-the-counter medicines.  Is failing to improve.  Past Medical History:  Diagnosis Date   Anxiety    Depression    since 12/18 from [facility], PPD   Hydrocephalus (HCC)    Meningitis    PTSD (post-traumatic stress disorder) 2019   Sjogren's syndrome (HCC)    Spinal headache     Patient Active Problem List   Diagnosis Date Noted   History of bilateral tubal ligation 12/24/2019   Sjogren's syndrome (HCC) 08/13/2019   Psoriasis 07/15/2019   ANA positive 06/10/2019   Recurrent pregnancy loss 08/21/2017    Past Surgical History:  Procedure Laterality Date   APPENDECTOMY     CESAREAN SECTION N/A 12/30/2015   Procedure: CESAREAN SECTION;  Surgeon: Adam Phenix, MD;  Location: [facility]IR[facility] SU[facility];  Service: Obstetrics;  Laterality: N/A;   CESAREAN SECTION N/A 04/05/2018   Procedure: CESAREAN SECTION;  Surgeon: Catalina Antigua, MD;  Location: [facility] BIRTHING SUITES;  Service: Obstetrics;  Laterality: N/A;   CESAREAN SECTION WITH BILATERAL TUBAL LIGATION N/A 12/21/2019   Procedure: CESAREAN SECTION WITH BILATERAL TUBAL LIGATION;  Surgeon: Hermina Staggers, MD;  Location: [facility] LD ORS;  Service: Obstetrics;  Laterality: N/A;   CHOLECYSTECTOMY     spnal tap     URETHRA SURGERY      OB History     Gravida  7   Para  4   Term  4   Preterm  0   AB  3   Living  4      SAB  3   IAB  0   Ectopic  0   Multiple  0   Live Births  4            Home Medications    Prior to Admission  medications   Medication Sig Start Date End Date Taking? Authorizing Provider  acetaminophen (TYLENOL) 500 MG tablet Take 1,000 mg by mouth every 6 (six) hours as needed for mild pain or headache.    Yes [provider]  ibuprofen (ADVIL) 800 MG tablet Take 1 tablet (800 mg total) by mouth every 6 (six) hours. 12/24/19  Yes Alric Seton, MD  meloxicam (MOBIC) 15 MG tablet Take 1 tablet (15 mg total) by mouth daily. 10/03/21  Yes Eustace Moore, MD  methylPREDNISolone (MEDROL DOSEPAK) 4 MG TBPK tablet tad 10/03/21  Yes Eustace Moore, MD    Family History Family History  Problem Relation Age of Onset   Diabetes Maternal Uncle    Diabetes Paternal Aunt    Diabetes Paternal Uncle    Cancer Paternal Grandmother    Diabetes Paternal Sharol Roussel' disease Mother    Lupus Maternal Grandmother     Social History Social History   Tobacco Use   Smoking status: Former    Types: Cigarettes    Quit date: 2018    Years since quitting: 5.5  Smokeless tobacco: Never  Vaping Use   Vaping Use: Former  Substance Use Topics   Alcohol use: No   Drug use: No     Allergies   Divalproex sodium and Demerol [meperidine]   Review of Systems Review of Systems See HPI  Physical Exam Triage Vital Signs ED Triage Vitals  Enc Vitals Group     BP 10/03/21 1617 121/81     Pulse Rate 10/03/21 1617 96     Resp 10/03/21 1617 20     Temp 10/03/21 1617 99.3 F (37.4 C)     Temp Source 10/03/21 1617 Oral     SpO2 10/03/21 1617 96 %     Weight 10/03/21 1615 180 lb (81.6 kg)     Height 10/03/21 1615 5\' 7"  (1.702 m)     Head Circumference --      Peak Flow --      Pain Score 10/03/21 1615 6     Pain Loc --      Pain Edu? --      Excl. in GC? --    No data found.  Updated Vital Signs BP 121/81 (BP Location: Left Arm)   Pulse 96   Temp 99.3 F (37.4 C) (Oral)   Resp 20   Ht 5\' 7"  (1.702 m)   Wt 81.6 kg   LMP 09/26/2021   SpO2 96%   BMI 28.19 kg/m     Physical Exam Constitutional:      General: She is not in acute distress.    Appearance: She is well-developed.  HENT:     Head: Normocephalic and atraumatic.  Eyes:     Conjunctiva/sclera: Conjunctivae normal.     Pupils: Pupils are equal, round, and reactive to light.  Cardiovascular:     Rate and Rhythm: Normal rate.  Pulmonary:     Effort: Pulmonary effort is normal. No respiratory distress.  Abdominal:     General: There is no distension.     Palpations: Abdomen is soft.  Musculoskeletal:        General: Normal range of motion.     Cervical back: Normal range of motion.     Comments: Shoulders appear symmetric.  Right shoulder is nontender to palpation.  Patient can forward flex to 90 degrees.  Abduct 80 degrees.  Pain and limitation with internal greater than external rotation.  Normal neurovascular exam  Skin:    General: Skin is warm and dry.  Neurological:     General: No focal deficit present.     Mental Status: She is alert.  Psychiatric:        Mood and Affect: Mood normal.        Behavior: Behavior normal.      UC Treatments / Results  Labs (all labs ordered are listed, but only abnormal results are displayed) Labs Reviewed - No data to display  EKG   Radiology No results found.  Procedures Procedures (including critical care time)  Medications Ordered in UC Medications - No data to display  Initial Impression / Assessment and Plan / UC Course  I have reviewed the triage vital signs and the nursing notes.  Pertinent labs & imaging results that were available during my care of the patient were reviewed by me and considered in my medical decision making (see chart for details).     Patient has a tendinitis in her shoulder.  We will try steroids and anti-inflammatories.  Ice.  Demonstrated some rotator cuff strengthening exercises with bands.  Return as needed Final Clinical Impressions(s) / UC Diagnoses   Final diagnoses:  None      Discharge Instructions      Take the Medrol Dosepak as directed.  Take all of day 1 today (3 now and 3 at bedtime) When you have finished the Dosepak, take meloxicam once a day.  Both of these drugs are anti-inflammatory. Put ice on your shoulder for 20 minutes a couple times a day See sports medicine if you fail to improve in a week or 2   ED Prescriptions     Medication Sig Dispense Auth. Provider   methylPREDNISolone (MEDROL DOSEPAK) 4 MG TBPK tablet tad 21 tablet Eustace Moore, MD   meloxicam (MOBIC) 15 MG tablet Take 1 tablet (15 mg total) by mouth daily. 15 tablet Eustace Moore, MD      PDMP not reviewed this encounter.   Eustace Moore, MD 10/03/21 [phone]

## 2022-03-26 DIAGNOSIS — R519 Headache, unspecified: Secondary | ICD-10-CM | POA: Diagnosis not present

## 2022-03-27 DIAGNOSIS — L730 Acne keloid: Secondary | ICD-10-CM | POA: Diagnosis not present

## 2022-03-27 DIAGNOSIS — M3501 Sicca syndrome with keratoconjunctivitis: Secondary | ICD-10-CM | POA: Diagnosis not present

## 2022-03-27 DIAGNOSIS — F17290 Nicotine dependence, other tobacco product, uncomplicated: Secondary | ICD-10-CM | POA: Diagnosis not present

## 2022-03-27 DIAGNOSIS — R519 Headache, unspecified: Secondary | ICD-10-CM | POA: Diagnosis not present

## 2022-03-27 DIAGNOSIS — Z7689 Persons encountering health services in other specified circumstances: Secondary | ICD-10-CM | POA: Diagnosis not present

## 2022-03-28 DIAGNOSIS — G4440 Drug-induced headache, not elsewhere classified, not intractable: Secondary | ICD-10-CM | POA: Diagnosis not present

## 2022-03-28 DIAGNOSIS — G43101 Migraine with aura, not intractable, with status migrainosus: Secondary | ICD-10-CM | POA: Diagnosis not present
